# Patient Record
Sex: Male | Born: 1935 | Race: Black or African American | Hispanic: No | Marital: Married | Smoking: Former smoker
Health system: Southern US, Community
[De-identification: ages and names within clinical notes are randomized; demographics above are authoritative.]

## PROBLEM LIST (undated history)

## (undated) DIAGNOSIS — I10 Essential (primary) hypertension: Secondary | ICD-10-CM

## (undated) HISTORY — PX: PROSTATE SURGERY: SHX751

---

## 1998-12-28 ENCOUNTER — Ambulatory Visit (HOSPITAL_COMMUNITY): Admission: RE | Admit: 1998-12-28 | Discharge: 1998-12-28 | Payer: Self-pay | Admitting: Otolaryngology

## 1998-12-28 ENCOUNTER — Encounter: Payer: Self-pay | Admitting: Otolaryngology

## 1999-02-07 ENCOUNTER — Ambulatory Visit (HOSPITAL_COMMUNITY): Admission: RE | Admit: 1999-02-07 | Discharge: 1999-02-07 | Payer: Self-pay | Admitting: Otolaryngology

## 1999-02-07 ENCOUNTER — Encounter: Payer: Self-pay | Admitting: Otolaryngology

## 1999-05-15 ENCOUNTER — Encounter: Admission: RE | Admit: 1999-05-15 | Discharge: 1999-08-13 | Payer: Self-pay | Admitting: *Deleted

## 1999-05-21 ENCOUNTER — Ambulatory Visit (HOSPITAL_COMMUNITY): Admission: RE | Admit: 1999-05-21 | Discharge: 1999-05-21 | Payer: Self-pay | Admitting: *Deleted

## 1999-05-22 ENCOUNTER — Encounter: Admission: RE | Admit: 1999-05-22 | Discharge: 1999-05-22 | Payer: Self-pay | Admitting: *Deleted

## 1999-06-07 ENCOUNTER — Ambulatory Visit (HOSPITAL_COMMUNITY): Admission: RE | Admit: 1999-06-07 | Discharge: 1999-06-07 | Payer: Self-pay | Admitting: *Deleted

## 2011-11-26 ENCOUNTER — Other Ambulatory Visit: Payer: Self-pay

## 2011-11-26 ENCOUNTER — Encounter (HOSPITAL_COMMUNITY): Payer: Self-pay | Admitting: *Deleted

## 2011-11-26 ENCOUNTER — Inpatient Hospital Stay (HOSPITAL_COMMUNITY)
Admission: EM | Admit: 2011-11-26 | Discharge: 2011-11-28 | DRG: 309 | Disposition: A | Discharge status: Home / Self Care | Payer: Medicare Other | Attending: Internal Medicine | Admitting: Internal Medicine

## 2011-11-26 ENCOUNTER — Emergency Department (HOSPITAL_COMMUNITY): Payer: Medicare Other

## 2011-11-26 DIAGNOSIS — R002 Palpitations: Principal | ICD-10-CM | POA: Diagnosis present

## 2011-11-26 DIAGNOSIS — G20A1 Parkinson's disease without dyskinesia, without mention of fluctuations: Secondary | ICD-10-CM | POA: Diagnosis present

## 2011-11-26 DIAGNOSIS — G20 Parkinson's disease: Secondary | ICD-10-CM | POA: Diagnosis present

## 2011-11-26 DIAGNOSIS — E7800 Pure hypercholesterolemia, unspecified: Secondary | ICD-10-CM | POA: Diagnosis present

## 2011-11-26 DIAGNOSIS — I10 Essential (primary) hypertension: Secondary | ICD-10-CM

## 2011-11-26 DIAGNOSIS — Z7982 Long term (current) use of aspirin: Secondary | ICD-10-CM

## 2011-11-26 DIAGNOSIS — N179 Acute kidney failure, unspecified: Secondary | ICD-10-CM | POA: Diagnosis present

## 2011-11-26 DIAGNOSIS — R112 Nausea with vomiting, unspecified: Secondary | ICD-10-CM

## 2011-11-26 DIAGNOSIS — Z79899 Other long term (current) drug therapy: Secondary | ICD-10-CM

## 2011-11-26 DIAGNOSIS — D649 Anemia, unspecified: Secondary | ICD-10-CM | POA: Diagnosis present

## 2011-11-26 DIAGNOSIS — Z87891 Personal history of nicotine dependence: Secondary | ICD-10-CM

## 2011-11-26 HISTORY — DX: Essential (primary) hypertension: I10

## 2011-11-26 LAB — CBC
HCT: 33.1 % — ABNORMAL LOW (ref 39.0–52.0)
Hemoglobin: 10.8 g/dL — ABNORMAL LOW (ref 13.0–17.0)
MCH: 28.6 pg (ref 26.0–34.0)
MCHC: 32.6 g/dL (ref 30.0–36.0)
MCV: 87.8 fL (ref 78.0–100.0)
Platelets: 164 10*3/uL (ref 150–400)
RBC: 3.77 MIL/uL — ABNORMAL LOW (ref 4.22–5.81)
RDW: 13.5 % (ref 11.5–15.5)
WBC: 3.8 10*3/uL — ABNORMAL LOW (ref 4.0–10.5)

## 2011-11-26 LAB — COMPREHENSIVE METABOLIC PANEL
AST: 12 U/L (ref 0–37)
BUN: 22 mg/dL (ref 6–23)
CO2: 28 mEq/L (ref 19–32)
Calcium: 9.5 mg/dL (ref 8.4–10.5)
Chloride: 106 mEq/L (ref 96–112)
Creatinine, Ser: 1.62 mg/dL — ABNORMAL HIGH (ref 0.50–1.35)
GFR calc Af Amer: 45 mL/min — ABNORMAL LOW (ref >90)
GFR calc non Af Amer: 38 mL/min — ABNORMAL LOW (ref >90)
Glucose, Bld: 119 mg/dL — ABNORMAL HIGH (ref 70–99)
Total Bilirubin: 0.2 mg/dL — ABNORMAL LOW (ref 0.3–1.2)

## 2011-11-26 LAB — PHOSPHORUS: Phosphorus: 2.8 mg/dL (ref 2.3–4.6)

## 2011-11-26 LAB — RETICULOCYTES
RBC.: 3.62 MIL/uL — ABNORMAL LOW (ref 4.22–5.81)
Retic Count, Absolute: 54.3 10*3/uL (ref 19.0–186.0)
Retic Ct Pct: 1.5 % (ref 0.4–3.1)

## 2011-11-26 LAB — DIFFERENTIAL
Basophils Absolute: 0 10*3/uL (ref 0.0–0.1)
Basophils Relative: 1 % (ref 0–1)
Eosinophils Absolute: 0 10*3/uL (ref 0.0–0.7)
Eosinophils Relative: 1 % (ref 0–5)
Lymphocytes Relative: 26 % (ref 12–46)
Lymphs Abs: 1 10*3/uL (ref 0.7–4.0)
Monocytes Absolute: 0.3 10*3/uL (ref 0.1–1.0)
Monocytes Relative: 9 % (ref 3–12)
Neutro Abs: 2.4 10*3/uL (ref 1.7–7.7)
Neutrophils Relative %: 64 % (ref 43–77)

## 2011-11-26 LAB — POCT I-STAT TROPONIN I: Troponin i, poc: 0 ng/mL (ref 0.00–0.08)

## 2011-11-26 LAB — MAGNESIUM: Magnesium: 2 mg/dL (ref 1.5–2.5)

## 2011-11-26 MED ORDER — ATORVASTATIN CALCIUM 40 MG PO TABS
40.0000 mg | ORAL_TABLET | Freq: Every day | ORAL | Status: DC
  Administered 2011-11-27: 40 mg via ORAL
  Filled 2011-11-26 (×2): qty 1

## 2011-11-26 MED ORDER — ONDANSETRON HCL 4 MG PO TABS: 4.0000 mg | ORAL_TABLET | Freq: Four times a day (QID) | ORAL | Status: DC | PRN

## 2011-11-26 MED ORDER — ACETAMINOPHEN 650 MG RE SUPP: 650.0000 mg | Freq: Four times a day (QID) | RECTAL | Status: DC | PRN

## 2011-11-26 MED ORDER — PANTOPRAZOLE SODIUM 40 MG PO TBEC
40.0000 mg | DELAYED_RELEASE_TABLET | Freq: Every day | ORAL | Status: DC
  Administered 2011-11-27 – 2011-11-28 (×2): 40 mg via ORAL
  Filled 2011-11-26 (×2): qty 1

## 2011-11-26 MED ORDER — ASPIRIN EC 81 MG PO TBEC
81.0000 mg | DELAYED_RELEASE_TABLET | Freq: Every day | ORAL | Status: DC
  Administered 2011-11-27 – 2011-11-28 (×2): 81 mg via ORAL
  Filled 2011-11-26 (×2): qty 1

## 2011-11-26 MED ORDER — METOPROLOL TARTRATE 25 MG PO TABS
25.0000 mg | ORAL_TABLET | Freq: Two times a day (BID) | ORAL | Status: DC
  Administered 2011-11-26 – 2011-11-28 (×4): 25 mg via ORAL
  Filled 2011-11-26 (×5): qty 1

## 2011-11-26 MED ORDER — HYPROMELLOSE (GONIOSCOPIC) 2.5 % OP SOLN: 1.0000 [drp] | Freq: Three times a day (TID) | OPHTHALMIC | Status: DC | PRN

## 2011-11-26 MED ORDER — ENOXAPARIN SODIUM 30 MG/0.3ML ~~LOC~~ SOLN: 30.0000 mg | SUBCUTANEOUS | Status: DC

## 2011-11-26 MED ORDER — ZOLPIDEM TARTRATE 5 MG PO TABS: 5.0000 mg | ORAL_TABLET | Freq: Every evening | ORAL | Status: DC | PRN

## 2011-11-26 MED ORDER — RASAGILINE MESYLATE 0.5 MG PO TABS
1.0000 mg | ORAL_TABLET | Freq: Every day | ORAL | Status: DC
  Filled 2011-11-26: qty 2

## 2011-11-26 MED ORDER — RASAGILINE MESYLATE 0.5 MG PO TABS
1.0000 mg | ORAL_TABLET | Freq: Every day | ORAL | Status: DC
  Administered 2011-11-26: 1 mg via ORAL
  Filled 2011-11-26 (×3): qty 2

## 2011-11-26 MED ORDER — SODIUM CHLORIDE 0.9 % IV SOLN
INTRAVENOUS | Status: AC
  Administered 2011-11-26 – 2011-11-27 (×2): via INTRAVENOUS

## 2011-11-26 MED ORDER — MORPHINE SULFATE 2 MG/ML IJ SOLN: 1.0000 mg | INTRAMUSCULAR | Status: DC | PRN

## 2011-11-26 MED ORDER — POLYVINYL ALCOHOL 1.4 % OP SOLN
1.0000 [drp] | OPHTHALMIC | Status: DC | PRN
  Filled 2011-11-26: qty 15

## 2011-11-26 MED ORDER — ACETAMINOPHEN 325 MG PO TABS: 650.0000 mg | ORAL_TABLET | Freq: Four times a day (QID) | ORAL | Status: DC | PRN

## 2011-11-26 MED ORDER — ONDANSETRON HCL 4 MG/2ML IJ SOLN: 4.0000 mg | Freq: Four times a day (QID) | INTRAMUSCULAR | Status: DC | PRN

## 2011-11-26 MED ORDER — SODIUM CHLORIDE 0.9 % IJ SOLN
3.0000 mL | Freq: Two times a day (BID) | INTRAMUSCULAR | Status: DC
  Administered 2011-11-27: 3 mL via INTRAVENOUS

## 2011-11-26 MED ORDER — DOCUSATE SODIUM 100 MG PO CAPS
100.0000 mg | ORAL_CAPSULE | Freq: Two times a day (BID) | ORAL | Status: DC
  Administered 2011-11-26 – 2011-11-28 (×4): 100 mg via ORAL
  Filled 2011-11-26 (×5): qty 1

## 2011-11-26 MED ORDER — CARBIDOPA-LEVODOPA 25-100 MG PO TABS
2.0000 | ORAL_TABLET | Freq: Three times a day (TID) | ORAL | Status: DC
  Administered 2011-11-26 – 2011-11-28 (×5): 2 via ORAL
  Filled 2011-11-26 (×7): qty 2

## 2011-11-26 NOTE — ED Notes (Signed)
Attempted to give report. Receiving RN reports she can't take report at moment d/t shift change.

## 2011-11-26 NOTE — ED Notes (Signed)
PT had heart fluttering that started Sunday.  Fatigue and no chest pain

## 2011-11-26 NOTE — H&P (Signed)
[facility]   [facility] [place]   Chief Complaint:  Heart fluttering since Sunday.  HPI: Kevin Roman is a plesant 78 year old male who has Parkinson's diesease, htn, hyperlipidemia, who followed at the [facility] in [place], who comes in with fluttering of the heart, which he first noticed while at church on Sunday. This lasted a few seconds, but after that he became weak and clammy. He had a similar episode this morning, hence coming to the ED. This is new for him. His wife mentions that he had cold like symptoms about 2 weeks ago, but this improved on its own. Patient denies any diarrhea or vomiting or fever or chest pain or dizziness, PND or orthopnea. No SOBE. No change in his meds recently.  Review of Systems:  The patient denies anorexia, fever, weight loss,, vision loss, decreased hearing, hoarseness, chest pain, syncope, dyspnea on exertion, peripheral edema, balance deficits, hemoptysis, abdominal pain, melena, hematochezia, severe indigestion/heartburn, hematuria, incontinence, genital sores, muscle weakness, suspicious skin lesions, transient blindness, difficulty walking, depression, unusual weight change, abnormal bleeding, enlarged lymph nodes, angioedema, and breast masses.  Past Medical History: Past Medical History  Diagnosis Date  . Hypertension   . Parkinson's disease    Past Surgical History  Procedure Date  . Prostate surgery     Medications: Prior to Admission medications   Medication Sig Start Date End Date Taking? Authorizing Provider  aspirin EC 81 MG tablet Take 81 mg by mouth daily.   Yes Historical Provider, MD  carbidopa-levodopa (SINEMET IR) 25-100 MG per tablet Take 2 tablets by mouth 3 (three) times daily.   Yes Historical Provider, MD  cloNIDine (CATAPRES) 0.1 MG tablet Take 0.1 mg by mouth daily as needed. For blood pressure   Yes Historical Provider, MD  hydroxypropyl methylcellulose (ISOPTO TEARS) 2.5 % ophthalmic solution Place 1 drop into both eyes 3 (three) times daily  as needed. For dry eyes   Yes Historical Provider, MD  lisinopril-hydrochlorothiazide (PRINZIDE,ZESTORETIC) 20-12.5 MG per tablet Take 1 tablet by mouth daily.   Yes Historical Provider, MD  metoprolol (LOPRESSOR) 50 MG tablet Take 25 mg by mouth 2 (two) times daily.   Yes Historical Provider, MD  rasagiline (AZILECT) 0.5 MG TABS Take 1 mg by mouth daily.   Yes Historical Provider, MD  simvastatin (ZOCOR) 80 MG tablet Take 40 mg by mouth at bedtime.   Yes Historical Provider, MD    Allergies:  No Known Allergies  Social History:  reports that he has quit smoking. He does not have any smokeless tobacco history on file. He reports that he does not drink alcohol or use illicit drugs. lives with his wife at home.  Family History: No family history on file.  Physical Exam: Filed Vitals:   11/26/11 1247 11/26/11 1511 11/26/11 1514 11/26/11 1632  BP: 137/91 145/97  152/78  Pulse: 72 62  58  Temp: 98 F (36.7 C) 97.8 F (36.6 C)    TempSrc: Oral Oral    Resp: 20 13  17   SpO2: 100% 100% 98% 99%   Parkinsonian tremors, supportive family members at bed side. PERRLA. No JVD. No carotid bruits. Lungs clear. S1S2 heard, No murmurs. RRR. Abdomen soft, non tender. +BS. CNS- Parkinson tremors, otherwise grossly Intact. Extremities- tinge of pedal edema bilaterally. Good peripheral pulses.   Labs on Admission:   [facility] 11/26/11 1321  NA 144  K 3.8  CL 106  CO2 28  GLUCOSE 119*  BUN 22  CREATININE 1.62*  CALCIUM 9.5  MG --  PHOS --    Basename 11/26/11 1321  AST 12  ALT 6  ALKPHOS 71  BILITOT 0.2*  PROT 7.7  ALBUMIN 4.0   No results found for this basename: LIPASE:2,AMYLASE:2 in the last 72 hours  Basename 11/26/11 1321  WBC 3.8*  NEUTROABS 2.4  HGB 10.8*  HCT 33.1*  MCV 87.8  PLT 164   No results found for this basename: CKTOTAL:3,CKMB:3,CKMBINDEX:3,TROPONINI:3 in the last 72 hours No results found for this basename: TSH,T4TOTAL,FREET3,T3FREE,THYROIDAB in the last 72  hours No results found for this basename: VITAMINB12:2,FOLATE:2,FERRITIN:2,TIBC:2,IRON:2,RETICCTPCT:2 in the last 72 hours  Radiological Exams on Admission: Dg Chest 2 View  11/26/2011  *RADIOLOGY REPORT*  Clinical Data: Atrial fibrillation.  CHEST - 2 VIEW  Comparison: None.  Findings: Mild cardiomegaly and hyperinflation of the lungs.  No confluent opacity or effusion.  No acute bony abnormality.  IMPRESSION: Mild cardiomegaly, hyperinflation.  No active disease.  Original Report Authenticated By: Cyndie Chime, M.D.    Assessment  Pleasant 78 year old male, who comes in with palpitations. I reviewed his EKG which shows some nonspecific st/t wave changes. He is slightly anemic and appears to have some renal insufficiency. He may be having arrhythmias. If this has anything to do with the apparent URTI 2 weeks ago, would worry about viral pericarditis.  Plan  .Palpitations- admit telemetry. Serial cardiac enzymes/tsh/2decho/lipids panel/d.dimer, consider cardiology eval depending on investigations. Gently rehydrate. Marland KitchenHTN (hypertension)- will hold acei/hctz/clonidine, and continue lopressor for now, and monitor. .Parkinson's disease- seems generally controlled. Resume home meds. .Hypercholesteremia- place on lipitor in house. .Anemia- normocytic. Anemia panel, stool hemoccult. Dvt/gi prophylaxis.  Condition guarded.   Kevin Roman [phone]. 11/26/2011, 6:49 PM

## 2011-11-26 NOTE — ED Notes (Signed)
Pt undressed and placed in gown and placed on monitor and continuous pulse ox, blood pressure and EKG.

## 2011-11-26 NOTE — ED Provider Notes (Signed)
I saw and evaluated the patient, reviewed the resident's note and I agree with the findings and plan. I saw and evaluated the patient's EKG and I agree with the resident's interpretation. Patient with 2 separate episodes of near syncopal event with palpitations which caused him to be weak and diaphoretic. He denies any chest pain and states he feels fine now. He has no cardiologist and no prior history EKG today T waves but otherwise normal labs. Will limit for observation for further evaluation of these episodes  Gwyneth Sprout, MD 11/26/11 1654

## 2011-11-26 NOTE — ED Provider Notes (Signed)
History     CSN: [id number]  Arrival date & time 11/26/11  1246   First MD Initiated Contact with Patient 11/26/11 1502      Chief Complaint  Patient presents with  . Weakness    pt reports episode of feeling as if heart was "fluttering" while singing in choir x3 days ago; also additional episode of "fluttering" shortly thereafter - reports feeling fatigued after each episode; no additional symptoms per pt     (Consider location/radiation/quality/duration/timing/severity/associated sxs/prior treatment) HPI Comments: Pt was sitting earlier today and had several seconds of palpitations.  Said he felt weak and lightheaded during episode but did not have LOC.  No chest pain or dyspnea.  Resolved quickly without intervention.  Now feels well.  Patient is a 78 y.o. male presenting with palpitations. The history is provided by the patient.  Palpitations  This is a new problem. The current episode started less than 1 hour ago. The problem occurs rarely. The problem has been resolved. Associated with: sitting at rest. Episode Length: "several seconds" Associated symptoms include irregular heartbeat. Pertinent negatives include no diaphoresis, no fever, no numbness, no chest pain, no chest pressure, no nausea, no vomiting, no headaches and no shortness of breath. He has tried nothing for the symptoms.    Past Medical History  Diagnosis Date  . Hypertension   . Parkinson's disease     Past Surgical History  Procedure Date  . Prostate surgery     No family history on file.  History  Substance Use Topics  . Smoking status: Former Games developer  . Smokeless tobacco: Not on file  . Alcohol Use: No      Review of Systems  Constitutional: Negative for fever, diaphoresis, activity change and fatigue.  HENT: Negative for congestion.   Eyes: Negative for pain.  Respiratory: Negative for chest tightness, shortness of breath, wheezing and stridor.   Cardiovascular: Positive for palpitations.  Negative for chest pain and leg swelling.  Gastrointestinal: Negative for nausea and vomiting.  Genitourinary: Negative for dysuria.  Musculoskeletal: Negative for arthralgias.  Skin: Negative for rash.  Neurological: Negative for numbness and headaches.  Psychiatric/Behavioral: Negative for behavioral problems.    Allergies  Review of patient's allergies indicates no known allergies.  Home Medications   Current Outpatient Rx  Name Route Sig Dispense Refill  . ASPIRIN EC 81 MG PO TBEC Oral Take 81 mg by mouth daily.    Marland Kitchen CARBIDOPA-LEVODOPA 25-100 MG PO TABS Oral Take 2 tablets by mouth 3 (three) times daily.    Marland Kitchen CLONIDINE HCL 0.1 MG PO TABS Oral Take 0.1 mg by mouth daily as needed. For blood pressure    . HYPROMELLOSE 2.5 % OP SOLN Both Eyes Place 1 drop into both eyes 3 (three) times daily as needed. For dry eyes    . LISINOPRIL-HYDROCHLOROTHIAZIDE 20-12.5 MG PO TABS Oral Take 1 tablet by mouth daily.    Marland Kitchen METOPROLOL TARTRATE 50 MG PO TABS Oral Take 25 mg by mouth 2 (two) times daily.    Marland Kitchen RASAGILINE MESYLATE 0.5 MG PO TABS Oral Take 1 mg by mouth daily.    Marland Kitchen SIMVASTATIN 80 MG PO TABS Oral Take 40 mg by mouth at bedtime.      BP 152/78  Pulse 58  Temp(Src) 97.8 F (36.6 C) (Oral)  Resp 17  SpO2 99%  Physical Exam  Constitutional: He is oriented to person, place, and time. He appears well-developed and well-nourished. No distress.  HENT:  Head: Normocephalic and  atraumatic.  Eyes: Conjunctivae and EOM are normal. Pupils are equal, round, and reactive to light. No scleral icterus.  Neck: Normal range of motion. Neck supple.  Cardiovascular: Normal rate and regular rhythm.  Exam reveals no gallop and no friction rub.   No murmur heard. Pulmonary/Chest: Effort normal and breath sounds normal. No respiratory distress. He has no wheezes. He has no rales. He exhibits no tenderness.  Abdominal: Soft. He exhibits no distension and no mass. There is no tenderness. There is no  rebound and no guarding.  Musculoskeletal: Normal range of motion. He exhibits no edema and no tenderness.  Neurological: He is alert and oriented to person, place, and time. He has normal reflexes. No cranial nerve deficit. He exhibits normal muscle tone. Coordination normal.       Tremulous (baseline)  Skin: Skin is warm and dry. No rash noted. He is not diaphoretic. No erythema.  Psychiatric: He has a normal mood and affect. His behavior is normal. Judgment and thought content normal.    ED Course  Procedures (including critical care time)   Date: 11/26/2011  Rate: 65  Rhythm: normal sinus rhythm  QRS Axis: normal  Intervals: normal  ST/T Wave abnormalities: nonspecific T wave changes  Conduction Disutrbances:none  Narrative Interpretation:   Old EKG Reviewed: none available    Labs Reviewed  CBC - Abnormal; Notable for the following:    WBC 3.8 (*)    RBC 3.77 (*)    Hemoglobin 10.8 (*)    HCT 33.1 (*)    All other components within normal limits  COMPREHENSIVE METABOLIC PANEL - Abnormal; Notable for the following:    Glucose, Bld 119 (*)    Creatinine, Ser 1.62 (*)    Total Bilirubin 0.2 (*)    GFR calc non Af Amer 38 (*)    GFR calc Af Amer 45 (*)    All other components within normal limits  DIFFERENTIAL  POCT I-STAT TROPONIN I   Dg Chest 2 View  11/26/2011  *RADIOLOGY REPORT*  Clinical Data: Atrial fibrillation.  CHEST - 2 VIEW  Comparison: None.  Findings: Mild cardiomegaly and hyperinflation of the lungs.  No confluent opacity or effusion.  No acute bony abnormality.  IMPRESSION: Mild cardiomegaly, hyperinflation.  No active disease.  Original Report Authenticated By: Cyndie Chime, M.D.     1. Palpitations       MDM  Pt was sitting earlier today and had several seconds of palpitations.  Said he felt weak and lightheaded during episode but did not have LOC.  No chest pain or dyspnea.  Resolved quickly without intervention.  Now feels well.  Similar  episode several days ago.  Concern for arrythmia.  Labs, EKG, CXR unconcerning in ED.  Hospitalist to admit for tele monitoring.        Army Chaco, MD 11/26/11 [phone]

## 2011-11-26 NOTE — ED Notes (Signed)
Pt is in nsr on ekg now.  NO fluttering and no history of this in the past

## 2011-11-27 DIAGNOSIS — G20 Parkinson's disease: Secondary | ICD-10-CM

## 2011-11-27 DIAGNOSIS — R112 Nausea with vomiting, unspecified: Secondary | ICD-10-CM

## 2011-11-27 DIAGNOSIS — I10 Essential (primary) hypertension: Secondary | ICD-10-CM

## 2011-11-27 DIAGNOSIS — I517 Cardiomegaly: Secondary | ICD-10-CM

## 2011-11-27 DIAGNOSIS — R002 Palpitations: Secondary | ICD-10-CM

## 2011-11-27 LAB — CARDIAC PANEL(CRET KIN+CKTOT+MB+TROPI)
CK, MB: 1.7 ng/mL (ref 0.3–4.0)
Relative Index: 1.6 (ref 0.0–2.5)
Relative Index: 1.5 (ref 0.0–2.5)
Total CK: 108 U/L (ref 7–232)
Troponin I: <0.3 ng/mL (ref <0.30)
Troponin I: <0.3 ng/mL (ref <0.30)

## 2011-11-27 LAB — CBC
Hemoglobin: 10.4 g/dL — ABNORMAL LOW (ref 13.0–17.0)
MCHC: 33 g/dL (ref 30.0–36.0)
RDW: 13.4 % (ref 11.5–15.5)
WBC: 4.7 10*3/uL (ref 4.0–10.5)

## 2011-11-27 LAB — COMPREHENSIVE METABOLIC PANEL
ALT: <5 U/L (ref 0–53)
BUN: 24 mg/dL — ABNORMAL HIGH (ref 6–23)
CO2: 29 mEq/L (ref 19–32)
Calcium: 9.3 mg/dL (ref 8.4–10.5)
Creatinine, Ser: 1.53 mg/dL — ABNORMAL HIGH (ref 0.50–1.35)
GFR calc Af Amer: 48 mL/min — ABNORMAL LOW (ref >90)
GFR calc non Af Amer: 41 mL/min — ABNORMAL LOW (ref >90)
Glucose, Bld: 89 mg/dL (ref 70–99)
Sodium: 144 mEq/L (ref 135–145)

## 2011-11-27 LAB — LIPID PANEL
Cholesterol: 129 mg/dL (ref 0–200)
HDL: 44 mg/dL (ref >39)
Triglycerides: 110 mg/dL (ref <150)

## 2011-11-27 LAB — VITAMIN B12: Vitamin B-12: 529 pg/mL (ref 211–911)

## 2011-11-27 LAB — FOLATE: Folate: 13.4 ng/mL

## 2011-11-27 NOTE — Progress Notes (Signed)
DAILY PROGRESS NOTE                              [facility]  SUBJECTIVE: Denies any shortness of breath or chest pain. He mentioned some palpitation this morning about 8:30 AM.  OBJECTIVE: BP 142/84  Pulse 68  Temp(Src) 98.2 F (36.8 C) (Oral)  Resp 20  Ht 5\' 8"  (1.727 m)  Wt 79.788 kg (175 lb 14.4 oz)  BMI 26.75 kg/m2  SpO2 99%  Intake/Output Summary (Last 24 hours) at 11/27/11 1109 Last data filed at 11/27/11 0044  Gross per 24 hour  Intake      0 ml  Output    600 ml  Net   -600 ml                      Weight change:  Physical Exam: General: Alert and awake oriented x3 not in any acute distress. HEENT: anicteric sclera, pupils equal reactive to light and accommodation CVS: S1-S2 heard, no murmur rubs or gallops Chest: clear to auscultation bilaterally, no wheezing rales or rhonchi Abdomen:  normal bowel sounds, soft, nontender, nondistended, no organomegaly Neuro: Cranial nerves II-XII intact, no focal neurological deficits Extremities: no cyanosis, no clubbing or edema noted bilaterally   Lab Results:  Basename 11/27/11 0356 11/26/11 2038 11/26/11 1321  NA 144 -- 144  K 4.0 -- 3.8  CL 108 -- 106  CO2 29 -- 28  GLUCOSE 89 -- 119*  BUN 24* -- 22  CREATININE 1.53* -- 1.62*  CALCIUM 9.3 -- 9.5  MG -- 2.0 --  PHOS -- 2.8 --    Basename 11/27/11 0356 11/26/11 1321  AST 11 12  ALT <5 6  ALKPHOS 68 71  BILITOT 0.3 0.2*  PROT 7.3 7.7  ALBUMIN 3.7 4.0   No results found for this basename: LIPASE:2,AMYLASE:2 in the last 72 hours  Basename 11/27/11 0356 11/26/11 1321  WBC 4.7 3.8*  NEUTROABS -- 2.4  HGB 10.4* 10.8*  HCT 31.5* 33.1*  MCV 87.3 87.8  PLT 160 164    Basename 11/27/11 0356 11/26/11 2020  CKTOTAL 108 105  CKMB 1.7 1.8  CKMBINDEX -- --  TROPONINI <0.30 <0.30   No components found with this basename: POCBNP:3  Basename 11/26/11 1838  DDIMER 0.40   No results found for this basename: HGBA1C:2 in the last  72 hours  Basename 11/27/11 0356  CHOL 129  HDL 44  LDLCALC 63  TRIG 110  CHOLHDL 2.9  LDLDIRECT --    Basename 11/26/11 1854  TSH 0.770  T4TOTAL --  T3FREE --  THYROIDAB --    Basename 11/26/11 2038  VITAMINB12 529  FOLATE 13.4  FERRITIN 208  TIBC 242  IRON 45  RETICCTPCT 1.5    Micro Results: No results found for this or any previous visit (from the past 240 hour(s)).  Studies/Results: Dg Chest 2 View  11/26/2011  *RADIOLOGY REPORT*  Clinical Data: Atrial fibrillation.  CHEST - 2 VIEW  Comparison: None.  Findings: Mild cardiomegaly and hyperinflation of the lungs.  No confluent opacity or effusion.  No acute bony abnormality.  IMPRESSION: Mild cardiomegaly, hyperinflation.  No active disease.  Original Report Authenticated By: Cyndie Chime, M.D.   Medications: Scheduled Meds:   . aspirin EC  81 mg Oral Daily  . atorvastatin  40 mg Oral q1800  . carbidopa-levodopa  2 tablet Oral TID  .  docusate sodium  100 mg Oral BID  . metoprolol  25 mg Oral BID  . pantoprazole  40 mg Oral Q1200  . rasagiline  1 mg Oral Daily  . sodium chloride  3 mL Intravenous Q12H  . DISCONTD: enoxaparin  30 mg Subcutaneous Q24H  . DISCONTD: rasagiline  1 mg Oral Daily   Continuous Infusions:   . sodium chloride 75 mL/hr at 11/26/11 2217   PRN Meds:.acetaminophen, acetaminophen, morphine injection, ondansetron (ZOFRAN) IV, ondansetron, polyvinyl alcohol, zolpidem, DISCONTD: hydroxypropyl methylcellulose  ASSESSMENT & PLAN: Active Problems:  Palpitations  HTN (hypertension)  Parkinson's disease  Hypercholesteremia  Anemia  Palpitations -Patient is on telemetry, was showing sinus rhythm was frequent PVCs and sometimes bigeminy. -No evidence of acute coronary syndrome by 3 sets of cardiac enzymes were negative. -Patient has severe. He is on metoprolol 25 mg. -Since palpitations is the admission diagnosis, I will ask cardiology to evaluate the patient.  Hypertension -Blood  pressure reasonably controlled, continue preadmission medications.  Parkinson's disease -This is chronic stable condition, continue preadmission medications.  Elevated creatinine -Not sure of this CKD or acute renal failure. Continue hydration for now. -Fall on renal function in the morning his BUN is elevated too.  Anemia -No baseline of hemoglobin. -Hemoglobin seems stable so far in the hospital, we'll check in the morning.  -Anemia panel showed normal iron studies, folate and B12.   LOS: 1 day   Lonald Troiani A 11/27/2011, 11:09 AM

## 2011-11-27 NOTE — Consult Note (Signed)
Admit date: 11/26/2011 Referring Physician  Dr. Arthor Captain Primary Physician  [facility] Primary Cardiologist  new-Reginaldo Hazard Reason for Consultation  palpitations  HPI: 78 year old male with Parkinson's disease and renal insufficiency who has had palpitations over the last couple of days.  He was singing in the choir at church where he felt a fluttering and a dizzy spell for about 2 seconds.  These symptoms repeated themselves on Monday so he came in for further evaluation.  He is rule out for MI.  He has been watched on telemetry.  There is been report of PVCs noted.  There is no evidence of sustained arrhythmias.  The patient does not report symptoms of any sustained arrhythmias.  His wife is concerned that he takes too much medication.  He denies any chest pain or shortness of breath.  His activity is limited by his Parkinson's disease.     PMH:   Past Medical History  Diagnosis Date  . Hypertension   . Parkinson's disease      PSH:   Past Surgical History  Procedure Date  . Prostate surgery     Allergies:  Review of patient's allergies indicates no known allergies. Prior to Admit Meds:   Prescriptions prior to admission  Medication Sig Dispense Refill  . aspirin EC 81 MG tablet Take 81 mg by mouth daily.      . carbidopa-levodopa (SINEMET IR) 25-100 MG per tablet Take 2 tablets by mouth 3 (three) times daily.      . cloNIDine (CATAPRES) 0.1 MG tablet Take 0.1 mg by mouth daily as needed. For blood pressure      . hydroxypropyl methylcellulose (ISOPTO TEARS) 2.5 % ophthalmic solution Place 1 drop into both eyes 3 (three) times daily as needed. For dry eyes      . lisinopril-hydrochlorothiazide (PRINZIDE,ZESTORETIC) 20-12.5 MG per tablet Take 1 tablet by mouth daily.      . metoprolol (LOPRESSOR) 50 MG tablet Take 25 mg by mouth 2 (two) times daily.      . rasagiline (AZILECT) 0.5 MG TABS Take 1 mg by mouth daily.      . simvastatin (ZOCOR) 80 MG tablet Take 40 mg by mouth at  bedtime.       Fam HX:   No family history on file. Social HX:    History   Social History  . Marital Status: Married    Spouse Name: N/A    Number of Children: N/A  . Years of Education: N/A   Occupational History  . Not on file.   Social History Main Topics  . Smoking status: Former Games developer  . Smokeless tobacco: Not on file  . Alcohol Use: No  . Drug Use: No  . Sexually Active:    Other Topics Concern  . Not on file   Social History Narrative  . No narrative on file     ROS:  All 11 ROS were addressed and are negative except what is stated in the HPI  Physical Exam: Blood pressure 142/82, pulse 67, temperature 98.3 F (36.8 C), temperature source Oral, resp. rate 18, height 5\' 8"  (1.727 m), weight 79.788 kg (175 lb 14.4 oz), SpO2 98.00%.  General: Well developed, well nourished, in no acute distress Head: Eyes PERRLA,   Normal cephalic and atramatic  Lungs:   Clear bilaterally to auscultation and percussion. Heart:   HRRR S1 S2            Abdomen:  abdomen soft and non-tender Msk:  . Normal  strength and tone for age. Extremities:   No  edema.   Neuro: Alert and oriented  Psych:  Normal affect, responds appropriately    Labs:   Lab Results  Component Value Date   WBC 4.7 11/27/2011   HGB 10.4* 11/27/2011   HCT 31.5* 11/27/2011   MCV 87.3 11/27/2011   PLT 160 11/27/2011    Lab 11/27/11 0356  NA 144  K 4.0  CL 108  CO2 29  BUN 24*  CREATININE 1.53*  CALCIUM 9.3  PROT 7.3  BILITOT 0.3  ALKPHOS 68  ALT <5  AST 11  GLUCOSE 89   No results found for this basename: PTT   Lab Results  Component Value Date   INR 1.10 11/27/2011   Lab Results  Component Value Date   CKTOTAL 120 11/27/2011   CKMB 1.8 11/27/2011   TROPONINI <0.30 11/27/2011     Lab Results  Component Value Date   CHOL 129 11/27/2011   Lab Results  Component Value Date   HDL 44 11/27/2011   Lab Results  Component Value Date   LDLCALC 63 11/27/2011   Lab Results  Component Value  Date   TRIG 110 11/27/2011   Lab Results  Component Value Date   CHOLHDL 2.9 11/27/2011   No results found for this basename: LDLDIRECT      Radiology:  Dg Chest 2 View  11/26/2011  *RADIOLOGY REPORT*  Clinical Data: Atrial fibrillation.  CHEST - 2 VIEW  Comparison: None.  Findings: Mild cardiomegaly and hyperinflation of the lungs.  No confluent opacity or effusion.  No acute bony abnormality.  IMPRESSION: Mild cardiomegaly, hyperinflation.  No active disease.  Original Report Authenticated By: Cyndie Chime, M.D.    EKG:  Normal sinus rhythm, nonspecific ST-T wave changes  ASSESSMENT: Palpitations; short lived, lasting a few seconds.  No symptoms longer than that.  PLAN:  No syncope.  This sounds to be likely, something benign.  Given the fact that they're very short lived, I would suspect PVCs or PACs.  He apparently had a PVC this morning which correlated to symptoms.  His echocardiogram was reviewed and he had normal LV function and essentially normal valvular function.  There are no symptoms of ischemia.  Could place a 30 day event monitor on the patient if he has no high risk arrhythmia while in the hospital.  If itt is confirmed that his symptoms correlate to PVCs, then no further workup would be warranted.  Corky Crafts., MD  11/27/2011  5:09 PM

## 2011-11-27 NOTE — Progress Notes (Signed)
  Echocardiogram 2D Echocardiogram has been performed.  Alphons Burgert L 11/27/2011, 11:33 AM

## 2011-11-28 DIAGNOSIS — I10 Essential (primary) hypertension: Secondary | ICD-10-CM

## 2011-11-28 DIAGNOSIS — N179 Acute kidney failure, unspecified: Secondary | ICD-10-CM | POA: Diagnosis present

## 2011-11-28 DIAGNOSIS — G20 Parkinson's disease: Secondary | ICD-10-CM

## 2011-11-28 DIAGNOSIS — R112 Nausea with vomiting, unspecified: Secondary | ICD-10-CM

## 2011-11-28 DIAGNOSIS — R002 Palpitations: Secondary | ICD-10-CM

## 2011-11-28 LAB — BASIC METABOLIC PANEL
Chloride: 105 mEq/L (ref 96–112)
Creatinine, Ser: 1.31 mg/dL (ref 0.50–1.35)
GFR calc Af Amer: 58 mL/min — ABNORMAL LOW (ref >90)
Potassium: 4.2 mEq/L (ref 3.5–5.1)
Sodium: 141 mEq/L (ref 135–145)

## 2011-11-28 NOTE — Progress Notes (Signed)
Utilization Review Completed.Kevin Roman T5/30/2013   

## 2011-11-28 NOTE — Progress Notes (Signed)
SUBJECTIVE:  No palpitations  OBJECTIVE:   Vitals:   Filed Vitals:   11/27/11 1351 11/27/11 2202 11/28/11 0602 11/28/11 1028  BP: 142/82 164/80 143/85 161/84  Pulse: 67 66 67 73  Temp: 98.3 F (36.8 C) 98.9 F (37.2 C) 98.4 F (36.9 C)   TempSrc: Oral Oral Oral   Resp: 18 18 18    Height:      Weight:   81.375 kg (179 lb 6.4 oz)   SpO2: 98% 98% 95%    I&O's:   Intake/Output Summary (Last 24 hours) at 11/28/11 1151 Last data filed at 11/28/11 0603  Gross per 24 hour  Intake      3 ml  Output    750 ml  Net   -747 ml   TELEMETRY: Reviewed telemetry pt in NSR     PHYSICAL EXAM General: Well developed, well nourished, in no acute distress Head:  Normal cephalic and atramatic  Lungs:   Clear bilaterally to auscultation and percussion. Heart:   HRRR S1 S2  Abdomen:  abdomen soft and non-tender Msk:  Back normal, normal gait. Normal strength and tone for age. Extremities:   Noedema.   Neuro: Alert and oriented X 3. Psych: Normal affect, responds appropriately   LABS: Basic Metabolic Panel:  Basename 11/28/11 0520 11/27/11 0356 11/26/11 2038  NA 141 144 --  K 4.2 4.0 --  CL 105 108 --  CO2 26 29 --  GLUCOSE 90 89 --  BUN 20 24* --  CREATININE 1.31 1.53* --  CALCIUM 8.8 9.3 --  MG -- -- 2.0  PHOS -- -- 2.8   Liver Function Tests:  Basename 11/27/11 0356 11/26/11 1321  AST 11 12  ALT <5 6  ALKPHOS 68 71  BILITOT 0.3 0.2*  PROT 7.3 7.7  ALBUMIN 3.7 4.0   No results found for this basename: LIPASE:2,AMYLASE:2 in the last 72 hours CBC:  Basename 11/27/11 0356 11/26/11 1321  WBC 4.7 3.8*  NEUTROABS -- 2.4  HGB 10.4* 10.8*  HCT 31.5* 33.1*  MCV 87.3 87.8  PLT 160 164   Cardiac Enzymes:  Basename 11/27/11 1159 11/27/11 0356 11/26/11 2020  CKTOTAL 120 108 105  CKMB 1.8 1.7 1.8  CKMBINDEX -- -- --  TROPONINI <0.30 <0.30 <0.30   BNP: No components found with this basename: POCBNP:3 D-Dimer:  [facility] 11/26/11 1838  DDIMER 0.40   Hemoglobin  A1C: No results found for this basename: HGBA1C in the last 72 hours Fasting Lipid Panel:  Basename 11/27/11 0356  CHOL 129  HDL 44  LDLCALC 63  TRIG 110  CHOLHDL 2.9  LDLDIRECT --   Thyroid Function Tests:  Basename 11/26/11 1854  TSH 0.770  T4TOTAL --  T3FREE --  THYROIDAB --   Anemia Panel:  Basename 11/26/11 2038  VITAMINB12 529  FOLATE 13.4  FERRITIN 208  TIBC 242  IRON 45  RETICCTPCT 1.5   Coag Panel:   Lab Results  Component Value Date   INR 1.10 11/27/2011    RADIOLOGY: Dg Chest 2 View  11/26/2011  *RADIOLOGY REPORT*  Clinical Data: Atrial fibrillation.  CHEST - 2 VIEW  Comparison: None.  Findings: Mild cardiomegaly and hyperinflation of the lungs.  No confluent opacity or effusion.  No acute bony abnormality.  IMPRESSION: Mild cardiomegaly, hyperinflation.  No active disease.  Original Report Authenticated By: Cyndie Chime, M.D.      ASSESSMENT: Palpitations  PLAN:  No significant arrhythmia by tele.  May have been related to PVCs.  Decrease  caffeine intake. Echo showed normal LV and valvular function.  If sx return, he can contact us and we can set up an outpatient event monitor.  Corky Crafts., MD  11/28/2011  11:51 AM

## 2011-11-28 NOTE — Discharge Summary (Signed)
HOSPITAL DISCHARGE SUMMARY  Kevin Roman  MRN: [id number]  DOB:May 07, 1930  Date of Admission: 11/26/2011 Date of Discharge: 11/28/2011         LOS: 2 days   Attending Physician:  Clydia Llano A  Patient's PCP:  No primary provider on file.  Consults: Treatment Team:  Corky Crafts, MD  Discharge Diagnoses: Present on Admission:  .Palpitations .HTN (hypertension) .Parkinson's disease .Hypercholesteremia .Anemia .Acute renal failure   Medication List  As of 11/28/2011 11:10 AM   TAKE these medications         aspirin EC 81 MG tablet   Take 81 mg by mouth daily.      carbidopa-levodopa 25-100 MG per tablet   Commonly known as: SINEMET IR   Take 2 tablets by mouth 3 (three) times daily.      cloNIDine 0.1 MG tablet   Commonly known as: CATAPRES   Take 0.1 mg by mouth daily as needed. For blood pressure      hydroxypropyl methylcellulose 2.5 % ophthalmic solution   Commonly known as: ISOPTO TEARS   Place 1 drop into both eyes 3 (three) times daily as needed. For dry eyes      lisinopril-hydrochlorothiazide 20-12.5 MG per tablet   Commonly known as: PRINZIDE,ZESTORETIC   Take 1 tablet by mouth daily.      metoprolol 50 MG tablet   Commonly known as: LOPRESSOR   Take 25 mg by mouth 2 (two) times daily.      rasagiline 0.5 MG Tabs   Commonly known as: AZILECT   Take 1 mg by mouth daily.      simvastatin 80 MG tablet   Commonly known as: ZOCOR   Take 40 mg by mouth at bedtime.             Brief Admission History: Kevin Roman is a plesant 78 year old male who has Parkinson's diesease, htn, hyperlipidemia, who followed at the [facility] in [place], who comes in with fluttering of the heart, which he first noticed while at church on Sunday. This lasted a few seconds, but after that he became weak and clammy. He had a similar episode this morning, hence coming to the ED. This is new for him. His wife mentions that he had cold like symptoms about 2 weeks ago, but this  improved on its own. Patient denies any diarrhea or vomiting or fever or chest pain or dizziness, PND or orthopnea. No SOBE. No change in his meds recently.  Hospital Course: Present on Admission:  .Palpitations .HTN (hypertension) .Parkinson's disease .Hypercholesteremia .Anemia .Acute renal failure  1. Palpitations: Patient admitted to the hospital for further evaluation, 12-lead EKG and normal 3 sets of cardiac enzymes ruled out acute coronary syndrome, patient was on telemetry and while on that he did not develop any life-threatening arrhythmias. Telemetry strips reviewed and showed multiple PVCs which for some short period of time for and bigeminy. Cardiology consult was obtained, and Dr. Eldridge Dace was kind enough to see the patient. He recommended if there is no life-threatening arrhythmia to continue patient metoprolol and followup as outpatient to consider 30 day event monitor. Patient is symptoms free at the time of discharge.  2. Acute renal failure: Patient came in with creatinine of 1.7, after aggressive IV fluid hydration patient creatinine went down to 1.3.  3. Hypertension: Blood pressure was reasonable control during this hospital stay, his preadmission medications was continued throughout the hospital stay.  4. Parkinson's disease: This is chronic stable condition continue preadmission  medications.  5. Anemia: We have no baseline of hemoglobin, hemoglobin seems to be stable in the hospital, anemia panel was done and showed normal iron studies folate and B12. Patient needs followup as outpatient.   Day of Discharge BP 161/84  Pulse 73  Temp(Src) 98.4 F (36.9 C) (Oral)  Resp 18  Ht 5\' 8"  (1.727 m)  Wt 81.375 kg (179 lb 6.4 oz)  BMI 27.28 kg/m2  SpO2 95% Physical Exam: GEN: No acute distress, cooperative with exam PSYCH: He is alert and oriented x4; does not appear anxious does not appear depressed; affect is normal  HEENT: Mucous membranes pink and anicteric;    Mouth: without oral thrush or lesions Eyes: PERRLA; EOM intact;  Neck: no cervical lymphadenopathy nor thyromegaly or carotid bruit; no JVD;  CHEST WALL: No tenderness, symmetrical to breathing bilaterally CHEST: Normal respiration, clear to auscultation bilaterally  HEART: Regular rate and rhythm; no murmurs, rubs or gallops, S1 and S2 heard  BACK: No kyphosis or scoliosis; no CVA tenderness  ABDOMEN:  soft non-tender; no masses, no organomegaly, normal abdominal bowel sounds; no pannus; no intertriginous candida.  EXTREMITIES: No bone or joint deformity; no edema; no ulcerations.  PULSES: 2+ and symmetric, neurovascularity is intact SKIN: Normal hydration no rash or ulceration, no flushing or suspicious lesions  CNS: Cranial nerves 2-12 grossly intact no focal neurologic deficit, coordination is intact gait not tested    Results for orders placed during the hospital encounter of 11/26/11 (from the past 24 hour(s))  CARDIAC PANEL(CRET KIN+CKTOT+MB+TROPI)     Status: Normal   Collection Time   11/27/11 11:59 AM      Component Value Range   Total CK 120  7 - 232 (U/L)   CK, MB 1.8  0.3 - 4.0 (ng/mL)   Troponin I <0.30  <0.30 (ng/mL)   Relative Index 1.5  0.0 - 2.5   BASIC METABOLIC PANEL     Status: Abnormal   Collection Time   11/28/11  5:20 AM      Component Value Range   Sodium 141  135 - 145 (mEq/L)   Potassium 4.2  3.5 - 5.1 (mEq/L)   Chloride 105  96 - 112 (mEq/L)   CO2 26  19 - 32 (mEq/L)   Glucose, Bld 90  70 - 99 (mg/dL)   BUN 20  6 - 23 (mg/dL)   Creatinine, Ser 1.61  0.50 - 1.35 (mg/dL)   Calcium 8.8  8.4 - 09.6 (mg/dL)   GFR calc non Af Amer 50 (*) >90 (mL/min)   GFR calc Af Amer 58 (*) >90 (mL/min)    Disposition: Home   Follow-up Appts: Discharge Orders    Future Orders Please Complete By Expires   Diet - low sodium heart healthy      Increase activity slowly         Follow-up Information    Follow up with Corky Crafts., MD in 1 week.   Contact  information:   [address] [place] [address] [phone]          I spent 40 minutes completing paperwork and coordinating discharge efforts.  SignedClydia Llano A 11/28/2011, 11:10 AM

## 2011-11-28 NOTE — Progress Notes (Signed)
D/c orders received;IV removed with gauze on, pt remains in stable condition, pt meds and instructions reviewed and given to pt; pt d/c to home 

## 2015-01-03 ENCOUNTER — Emergency Department (HOSPITAL_COMMUNITY): Payer: Self-pay

## 2015-01-03 ENCOUNTER — Emergency Department (HOSPITAL_COMMUNITY)
Admission: EM | Admit: 2015-01-03 | Discharge: 2015-01-03 | Disposition: A | Discharge status: Home / Self Care | Payer: Self-pay | Attending: Emergency Medicine | Admitting: Emergency Medicine

## 2015-01-03 ENCOUNTER — Encounter (HOSPITAL_COMMUNITY): Payer: Self-pay | Admitting: Emergency Medicine

## 2015-01-03 DIAGNOSIS — R2243 Localized swelling, mass and lump, lower limb, bilateral: Secondary | ICD-10-CM | POA: Diagnosis not present

## 2015-01-03 DIAGNOSIS — G20 Parkinson's disease: Secondary | ICD-10-CM | POA: Insufficient documentation

## 2015-01-03 DIAGNOSIS — R600 Localized edema: Secondary | ICD-10-CM

## 2015-01-03 DIAGNOSIS — Z87891 Personal history of nicotine dependence: Secondary | ICD-10-CM | POA: Diagnosis not present

## 2015-01-03 DIAGNOSIS — Z79899 Other long term (current) drug therapy: Secondary | ICD-10-CM | POA: Diagnosis not present

## 2015-01-03 DIAGNOSIS — Z7982 Long term (current) use of aspirin: Secondary | ICD-10-CM | POA: Diagnosis not present

## 2015-01-03 DIAGNOSIS — I10 Essential (primary) hypertension: Secondary | ICD-10-CM | POA: Insufficient documentation

## 2015-01-03 DIAGNOSIS — R0602 Shortness of breath: Secondary | ICD-10-CM | POA: Diagnosis not present

## 2015-01-03 LAB — BRAIN NATRIURETIC PEPTIDE: B Natriuretic Peptide: 233.2 pg/mL — ABNORMAL HIGH (ref 0.0–100.0)

## 2015-01-03 LAB — COMPREHENSIVE METABOLIC PANEL
ALBUMIN: 3.6 g/dL (ref 3.5–5.0)
ALK PHOS: 66 U/L (ref 38–126)
AST: 14 U/L — ABNORMAL LOW (ref 15–41)
Anion gap: 6 (ref 5–15)
BUN: 22 mg/dL — AB (ref 6–20)
CALCIUM: 8.8 mg/dL — AB (ref 8.9–10.3)
CHLORIDE: 109 mmol/L (ref 101–111)
CO2: 25 mmol/L (ref 22–32)
Creatinine, Ser: 1.49 mg/dL — ABNORMAL HIGH (ref 0.61–1.24)
GFR calc non Af Amer: 42 mL/min — ABNORMAL LOW (ref >60)
GFR, EST AFRICAN AMERICAN: 48 mL/min — AB (ref >60)
Glucose, Bld: 99 mg/dL (ref 65–99)
POTASSIUM: 4.1 mmol/L (ref 3.5–5.1)
SODIUM: 140 mmol/L (ref 135–145)
TOTAL PROTEIN: 6.7 g/dL (ref 6.5–8.1)
Total Bilirubin: 0.5 mg/dL (ref 0.3–1.2)

## 2015-01-03 LAB — CBC WITH DIFFERENTIAL/PLATELET
Basophils Absolute: 0 10*3/uL (ref 0.0–0.1)
Basophils Relative: 1 % (ref 0–1)
EOS ABS: 0.1 10*3/uL (ref 0.0–0.7)
EOS PCT: 2 % (ref 0–5)
HEMATOCRIT: 27.3 % — AB (ref 39.0–52.0)
Hemoglobin: 8.6 g/dL — ABNORMAL LOW (ref 13.0–17.0)
LYMPHS ABS: 1 10*3/uL (ref 0.7–4.0)
Lymphocytes Relative: 24 % (ref 12–46)
MCH: 27 pg (ref 26.0–34.0)
MCHC: 31.5 g/dL (ref 30.0–36.0)
MCV: 85.6 fL (ref 78.0–100.0)
MONOS PCT: 9 % (ref 3–12)
Monocytes Absolute: 0.4 10*3/uL (ref 0.1–1.0)
Neutro Abs: 2.7 10*3/uL (ref 1.7–7.7)
Neutrophils Relative %: 64 % (ref 43–77)
PLATELETS: 189 10*3/uL (ref 150–400)
RBC: 3.19 MIL/uL — ABNORMAL LOW (ref 4.22–5.81)
RDW: 13.7 % (ref 11.5–15.5)
WBC: 4.1 10*3/uL (ref 4.0–10.5)

## 2015-01-03 LAB — TROPONIN I: Troponin I: <0.03 ng/mL (ref <0.031)

## 2015-01-03 MED ORDER — FUROSEMIDE 20 MG PO TABS: 20.0000 mg | ORAL_TABLET | Freq: Every day | ORAL | Status: DC

## 2015-01-03 NOTE — ED Provider Notes (Signed)
CSN: [id number]     Arrival date & time 01/03/15  1323 History   First MD Initiated Contact with Patient 01/03/15 1325     Chief Complaint  Patient presents with  . Shortness of Breath     (Consider location/radiation/quality/duration/timing/severity/associated sxs/prior Treatment) HPI Comments: Patient is an 81 year old male with history of hypertension and Parkinson's disease. He presents for evaluation of shortness of breath which has been worsening over the past several weeks. States that when he gets up to walk across the room he becomes short winded and has to rest. He does report some swelling in his ankles, but denies any chest pain, fever, productive cough. He has no history of cardiac disease and no history of CHF. He also has no history of COPD and is a nonsmoker.  Patient is a 81 y.o. male presenting with shortness of breath. The history is provided by the patient.  Shortness of Breath Severity:  Moderate Onset quality:  Gradual Duration:  2 weeks Timing:  Constant Progression:  Worsening Chronicity:  New Context: activity   Relieved by:  Nothing Worsened by:  Nothing tried Ineffective treatments:  None tried Associated symptoms: no chest pain, no cough and no fever     Past Medical History  Diagnosis Date  . Hypertension   . Parkinson's disease    Past Surgical History  Procedure Laterality Date  . Prostate surgery     No family history on file. History  Substance Use Topics  . Smoking status: Former Games developer  . Smokeless tobacco: Not on file  . Alcohol Use: No    Review of Systems  Constitutional: Negative for fever.  Respiratory: Positive for shortness of breath. Negative for cough.   Cardiovascular: Negative for chest pain.  All other systems reviewed and are negative.     Allergies  Review of patient's allergies indicates no known allergies.  Home Medications   Prior to Admission medications   Medication Sig Start Date End Date Taking?  Authorizing Provider  aspirin EC 81 MG tablet Take 81 mg by mouth daily.    Historical Provider, MD  carbidopa-levodopa (SINEMET IR) 25-100 MG per tablet Take 2 tablets by mouth 3 (three) times daily.    Historical Provider, MD  cloNIDine (CATAPRES) 0.1 MG tablet Take 0.1 mg by mouth daily as needed. For blood pressure    Historical Provider, MD  hydroxypropyl methylcellulose (ISOPTO TEARS) 2.5 % ophthalmic solution Place 1 drop into both eyes 3 (three) times daily as needed. For dry eyes    Historical Provider, MD  lisinopril-hydrochlorothiazide (PRINZIDE,ZESTORETIC) 20-12.5 MG per tablet Take 1 tablet by mouth daily.    Historical Provider, MD  metoprolol (LOPRESSOR) 50 MG tablet Take 25 mg by mouth 2 (two) times daily.    Historical Provider, MD  rasagiline (AZILECT) 0.5 MG TABS Take 1 mg by mouth daily.    Historical Provider, MD  simvastatin (ZOCOR) 80 MG tablet Take 40 mg by mouth at bedtime.    Historical Provider, MD   BP 147/74 mmHg  Pulse 67  Temp(Src) 98.1 F (36.7 C) (Oral)  Resp 18  SpO2 97% Physical Exam  Constitutional: He is oriented to person, place, and time. He appears well-developed and well-nourished. No distress.  HENT:  Head: Normocephalic and atraumatic.  Neck: Normal range of motion. Neck supple.  Cardiovascular: Normal rate, regular rhythm and normal heart sounds.   No murmur heard. Pulmonary/Chest: Effort normal and breath sounds normal. No respiratory distress. He has no wheezes. He has no  rales.  Abdominal: Soft. Bowel sounds are normal. He exhibits no distension. There is no tenderness.  Musculoskeletal: Normal range of motion. He exhibits edema.  There is 2+ pitting edema of the BLE.  Lymphadenopathy:    He has no cervical adenopathy.  Neurological: He is alert and oriented to person, place, and time.  Skin: Skin is warm and dry. He is not diaphoretic.  Nursing note and vitals reviewed.   ED Course  Procedures (including critical care time) Labs  Review Labs Reviewed  COMPREHENSIVE METABOLIC PANEL  TROPONIN I  BRAIN NATRIURETIC PEPTIDE  CBC WITH DIFFERENTIAL/PLATELET    Imaging Review No results found.   EKG Interpretation   Date/Time:  Tuesday January 03 2015 13:23:59 EDT Ventricular Rate:  68 PR Interval:  201 QRS Duration: 87 QT Interval:  435 QTC Calculation: 463 R Axis:   -10 Text Interpretation:  Atrial-paced complexes Borderline T abnormalities,  lateral leads Confirmed by Jedidiah Demartini  MD, Arshad Oberholzer ([id number]) on 01/03/2015 1:47:36  PM      MDM   Final diagnoses:  None    Patient is an 81 year old male with history of Parkinson's disease who presents with complaints of shortness of breath and swelling in his ankles that is worsened over the past several days. His workup reveals slightly worsening anemia, elevated BNP, and chest x-ray which shows cardiomegaly with no pulmonary edema. I suspect this is a fluid issue and we'll treat with a small dose of Lasix and follow-up with his primary Dr.    Geoffery Lyonsouglas Mykaela Arena, MD 01/03/15 [phone]

## 2015-01-03 NOTE — ED Notes (Signed)
Pt back from x-ray.

## 2015-01-03 NOTE — ED Notes (Signed)
Pt transporting to xray.  

## 2015-01-03 NOTE — Discharge Instructions (Signed)
Lasix as prescribed.  Return to the emergency department if symptoms significantly worsen or change.  Follow up with your primary Dr. in one week for a recheck.   Peripheral Edema You have swelling in your legs (peripheral edema). This swelling is due to excess accumulation of salt and water in your body. Edema may be a sign of heart, kidney or liver disease, or a side effect of a medication. It may also be due to problems in the leg veins. Elevating your legs and using special support stockings may be very helpful, if the cause of the swelling is due to poor venous circulation. Avoid long periods of standing, whatever the cause. Treatment of edema depends on identifying the cause. Chips, pretzels, pickles and other salty foods should be avoided. Restricting salt in your diet is almost always needed. Water pills (diuretics) are often used to remove the excess salt and water from your body via urine. These medicines prevent the kidney from reabsorbing sodium. This increases urine flow. Diuretic treatment may also result in lowering of potassium levels in your body. Potassium supplements may be needed if you have to use diuretics daily. Daily weights can help you keep track of your progress in clearing your edema. You should call your caregiver for follow up care as recommended. SEEK IMMEDIATE MEDICAL CARE IF:   You have increased swelling, pain, redness, or heat in your legs.  You develop shortness of breath, especially when lying down.  You develop chest or abdominal pain, weakness, or fainting.  You have a fever. Document Released: 07/25/2004 Document Revised: 09/09/2011 Document Reviewed: 07/05/2009 [facility] Patient Information 2015 [place], [place]. This information is not intended to replace advice given to you by your health care provider. Make sure you discuss any questions you have with your health care provider.

## 2015-01-03 NOTE — ED Notes (Signed)
To ED via [facility] from home with c/o increased shortness of breath. Pt has 2 + pitting edema in ankles/lower legs. No hx of same. Pt is alert/oriented x 4, w/d-- hx of parkinson's.

## 2016-07-21 ENCOUNTER — Emergency Department (HOSPITAL_COMMUNITY): Payer: Medicare Other

## 2016-07-21 ENCOUNTER — Inpatient Hospital Stay (HOSPITAL_COMMUNITY): Payer: Medicare Other

## 2016-07-21 ENCOUNTER — Encounter (HOSPITAL_COMMUNITY): Payer: Self-pay

## 2016-07-21 ENCOUNTER — Inpatient Hospital Stay (HOSPITAL_COMMUNITY)
Admission: EM | Admit: 2016-07-21 | Discharge: 2016-07-29 | DRG: 351 | Disposition: A | Discharge status: Skilled Nursing Facility | Payer: Medicare Other | Attending: Internal Medicine | Admitting: Internal Medicine

## 2016-07-21 DIAGNOSIS — Z4682 Encounter for fitting and adjustment of non-vascular catheter: Secondary | ICD-10-CM | POA: Diagnosis not present

## 2016-07-21 DIAGNOSIS — K7689 Other specified diseases of liver: Secondary | ICD-10-CM | POA: Diagnosis not present

## 2016-07-21 DIAGNOSIS — R451 Restlessness and agitation: Secondary | ICD-10-CM | POA: Diagnosis present

## 2016-07-21 DIAGNOSIS — K56600 Partial intestinal obstruction, unspecified as to cause: Secondary | ICD-10-CM | POA: Diagnosis present

## 2016-07-21 DIAGNOSIS — K4030 Unilateral inguinal hernia, with obstruction, without gangrene, not specified as recurrent: Principal | ICD-10-CM | POA: Diagnosis present

## 2016-07-21 DIAGNOSIS — Z781 Physical restraint status: Secondary | ICD-10-CM

## 2016-07-21 DIAGNOSIS — K460 Unspecified abdominal hernia with obstruction, without gangrene: Secondary | ICD-10-CM | POA: Diagnosis present

## 2016-07-21 DIAGNOSIS — Z87891 Personal history of nicotine dependence: Secondary | ICD-10-CM | POA: Diagnosis not present

## 2016-07-21 DIAGNOSIS — M6281 Muscle weakness (generalized): Secondary | ICD-10-CM | POA: Diagnosis not present

## 2016-07-21 DIAGNOSIS — D649 Anemia, unspecified: Secondary | ICD-10-CM | POA: Diagnosis not present

## 2016-07-21 DIAGNOSIS — I10 Essential (primary) hypertension: Secondary | ICD-10-CM | POA: Diagnosis present

## 2016-07-21 DIAGNOSIS — I129 Hypertensive chronic kidney disease with stage 1 through stage 4 chronic kidney disease, or unspecified chronic kidney disease: Secondary | ICD-10-CM | POA: Diagnosis not present

## 2016-07-21 DIAGNOSIS — K5649 Other impaction of intestine: Secondary | ICD-10-CM | POA: Diagnosis not present

## 2016-07-21 DIAGNOSIS — R100 Acute abdomen: Secondary | ICD-10-CM | POA: Diagnosis not present

## 2016-07-21 DIAGNOSIS — Z8546 Personal history of malignant neoplasm of prostate: Secondary | ICD-10-CM | POA: Diagnosis not present

## 2016-07-21 DIAGNOSIS — Z0189 Encounter for other specified special examinations: Secondary | ICD-10-CM

## 2016-07-21 DIAGNOSIS — R410 Disorientation, unspecified: Secondary | ICD-10-CM | POA: Diagnosis not present

## 2016-07-21 DIAGNOSIS — R1110 Vomiting, unspecified: Secondary | ICD-10-CM | POA: Diagnosis not present

## 2016-07-21 DIAGNOSIS — E876 Hypokalemia: Secondary | ICD-10-CM | POA: Diagnosis present

## 2016-07-21 DIAGNOSIS — N433 Hydrocele, unspecified: Secondary | ICD-10-CM | POA: Diagnosis not present

## 2016-07-21 DIAGNOSIS — E870 Hyperosmolality and hypernatremia: Secondary | ICD-10-CM | POA: Diagnosis not present

## 2016-07-21 DIAGNOSIS — K56609 Unspecified intestinal obstruction, unspecified as to partial versus complete obstruction: Secondary | ICD-10-CM | POA: Diagnosis not present

## 2016-07-21 DIAGNOSIS — K625 Hemorrhage of anus and rectum: Secondary | ICD-10-CM | POA: Diagnosis present

## 2016-07-21 DIAGNOSIS — Z972 Presence of dental prosthetic device (complete) (partial): Secondary | ICD-10-CM

## 2016-07-21 DIAGNOSIS — I509 Heart failure, unspecified: Secondary | ICD-10-CM

## 2016-07-21 DIAGNOSIS — Z7982 Long term (current) use of aspirin: Secondary | ICD-10-CM | POA: Diagnosis not present

## 2016-07-21 DIAGNOSIS — N4400 Torsion of testis, unspecified: Secondary | ICD-10-CM

## 2016-07-21 DIAGNOSIS — R1111 Vomiting without nausea: Secondary | ICD-10-CM | POA: Diagnosis not present

## 2016-07-21 DIAGNOSIS — K4090 Unilateral inguinal hernia, without obstruction or gangrene, not specified as recurrent: Secondary | ICD-10-CM | POA: Diagnosis not present

## 2016-07-21 DIAGNOSIS — D72829 Elevated white blood cell count, unspecified: Secondary | ICD-10-CM | POA: Diagnosis not present

## 2016-07-21 DIAGNOSIS — K219 Gastro-esophageal reflux disease without esophagitis: Secondary | ICD-10-CM | POA: Diagnosis not present

## 2016-07-21 DIAGNOSIS — Z79899 Other long term (current) drug therapy: Secondary | ICD-10-CM | POA: Diagnosis not present

## 2016-07-21 DIAGNOSIS — E7800 Pure hypercholesterolemia, unspecified: Secondary | ICD-10-CM | POA: Diagnosis not present

## 2016-07-21 DIAGNOSIS — R6889 Other general symptoms and signs: Secondary | ICD-10-CM | POA: Diagnosis not present

## 2016-07-21 DIAGNOSIS — R110 Nausea: Secondary | ICD-10-CM | POA: Diagnosis not present

## 2016-07-21 DIAGNOSIS — R2689 Other abnormalities of gait and mobility: Secondary | ICD-10-CM | POA: Diagnosis not present

## 2016-07-21 DIAGNOSIS — N183 Chronic kidney disease, stage 3 (moderate): Secondary | ICD-10-CM | POA: Diagnosis present

## 2016-07-21 DIAGNOSIS — D638 Anemia in other chronic diseases classified elsewhere: Secondary | ICD-10-CM | POA: Diagnosis not present

## 2016-07-21 DIAGNOSIS — E785 Hyperlipidemia, unspecified: Secondary | ICD-10-CM | POA: Diagnosis not present

## 2016-07-21 DIAGNOSIS — R109 Unspecified abdominal pain: Secondary | ICD-10-CM | POA: Diagnosis not present

## 2016-07-21 DIAGNOSIS — G20 Parkinson's disease: Secondary | ICD-10-CM | POA: Diagnosis present

## 2016-07-21 DIAGNOSIS — Z4659 Encounter for fitting and adjustment of other gastrointestinal appliance and device: Secondary | ICD-10-CM

## 2016-07-21 LAB — TYPE AND SCREEN
ABO/RH(D): B POS
Antibody Screen: NEGATIVE

## 2016-07-21 LAB — COMPREHENSIVE METABOLIC PANEL
ALBUMIN: 4.5 g/dL (ref 3.5–5.0)
ALK PHOS: 71 U/L (ref 38–126)
ALT: 11 U/L — AB (ref 17–63)
AST: 19 U/L (ref 15–41)
Anion gap: 10 (ref 5–15)
BUN: 30 mg/dL — ABNORMAL HIGH (ref 6–20)
CHLORIDE: 105 mmol/L (ref 101–111)
CO2: 26 mmol/L (ref 22–32)
CREATININE: 1.28 mg/dL — AB (ref 0.61–1.24)
Calcium: 9.6 mg/dL (ref 8.9–10.3)
GFR calc Af Amer: 58 mL/min — ABNORMAL LOW (ref >60)
GFR calc non Af Amer: 50 mL/min — ABNORMAL LOW (ref >60)
GLUCOSE: 132 mg/dL — AB (ref 65–99)
Potassium: 3.7 mmol/L (ref 3.5–5.1)
SODIUM: 141 mmol/L (ref 135–145)
Total Bilirubin: 0.6 mg/dL (ref 0.3–1.2)
Total Protein: 7.8 g/dL (ref 6.5–8.1)

## 2016-07-21 LAB — CBC WITH DIFFERENTIAL/PLATELET
BASOS ABS: 0 10*3/uL (ref 0.0–0.1)
BASOS PCT: 0 %
EOS ABS: 0 10*3/uL (ref 0.0–0.7)
Eosinophils Relative: 0 %
HCT: 33.8 % — ABNORMAL LOW (ref 39.0–52.0)
Hemoglobin: 11.1 g/dL — ABNORMAL LOW (ref 13.0–17.0)
LYMPHS ABS: 0.5 10*3/uL — AB (ref 0.7–4.0)
Lymphocytes Relative: 8 %
MCH: 29.1 pg (ref 26.0–34.0)
MCHC: 32.8 g/dL (ref 30.0–36.0)
MCV: 88.5 fL (ref 78.0–100.0)
Monocytes Absolute: 0.2 10*3/uL (ref 0.1–1.0)
Monocytes Relative: 4 %
NEUTROS PCT: 88 %
Neutro Abs: 5.6 10*3/uL (ref 1.7–7.7)
PLATELETS: 170 10*3/uL (ref 150–400)
RBC: 3.82 MIL/uL — AB (ref 4.22–5.81)
RDW: 14 % (ref 11.5–15.5)
WBC: 6.3 10*3/uL (ref 4.0–10.5)

## 2016-07-21 LAB — URINALYSIS, ROUTINE W REFLEX MICROSCOPIC
BACTERIA UA: NONE SEEN
BILIRUBIN URINE: NEGATIVE
Glucose, UA: NEGATIVE mg/dL
Hgb urine dipstick: NEGATIVE
KETONES UR: NEGATIVE mg/dL
Leukocytes, UA: NEGATIVE
Nitrite: NEGATIVE
PROTEIN: 30 mg/dL — AB
Specific Gravity, Urine: 1.042 — ABNORMAL HIGH (ref 1.005–1.030)
pH: 5 (ref 5.0–8.0)

## 2016-07-21 LAB — POC OCCULT BLOOD, ED: Fecal Occult Bld: NEGATIVE

## 2016-07-21 LAB — I-STAT CG4 LACTIC ACID, ED: Lactic Acid, Venous: 0.84 mmol/L (ref 0.5–1.9)

## 2016-07-21 LAB — ABO/RH: ABO/RH(D): B POS

## 2016-07-21 MED ORDER — DEXTROSE-NACL 5-0.9 % IV SOLN
INTRAVENOUS | Status: DC
  Administered 2016-07-21 – 2016-07-23 (×3): via INTRAVENOUS

## 2016-07-21 MED ORDER — METOPROLOL TARTRATE 25 MG PO TABS
25.0000 mg | ORAL_TABLET | Freq: Two times a day (BID) | ORAL | Status: DC
  Administered 2016-07-24 – 2016-07-25 (×3): 25 mg via ORAL
  Filled 2016-07-21 (×4): qty 1

## 2016-07-21 MED ORDER — FOLIC ACID 1 MG PO TABS
1.0000 mg | ORAL_TABLET | Freq: Every day | ORAL | Status: DC
  Filled 2016-07-21: qty 1

## 2016-07-21 MED ORDER — SODIUM CHLORIDE 0.9 % IV BOLUS (SEPSIS)
1000.0000 mL | Freq: Once | INTRAVENOUS | Status: AC
  Administered 2016-07-21: 1000 mL via INTRAVENOUS

## 2016-07-21 MED ORDER — ONDANSETRON HCL 4 MG/2ML IJ SOLN
4.0000 mg | Freq: Once | INTRAMUSCULAR | Status: AC
  Administered 2016-07-21: 4 mg via INTRAVENOUS

## 2016-07-21 MED ORDER — ACETAMINOPHEN 650 MG RE SUPP
650.0000 mg | Freq: Four times a day (QID) | RECTAL | Status: DC | PRN
  Administered 2016-07-22: 650 mg via RECTAL
  Filled 2016-07-21 (×2): qty 1

## 2016-07-21 MED ORDER — ONDANSETRON HCL 4 MG PO TABS: 4.0000 mg | ORAL_TABLET | Freq: Four times a day (QID) | ORAL | Status: DC | PRN

## 2016-07-21 MED ORDER — PIPERACILLIN-TAZOBACTAM 3.375 G IVPB
3.3750 g | Freq: Once | INTRAVENOUS | Status: AC
  Administered 2016-07-21: 3.375 g via INTRAVENOUS
  Filled 2016-07-21: qty 50

## 2016-07-21 MED ORDER — ONDANSETRON HCL 4 MG/2ML IJ SOLN: 4.0000 mg | Freq: Three times a day (TID) | INTRAMUSCULAR | Status: DC | PRN

## 2016-07-21 MED ORDER — PANTOPRAZOLE SODIUM 40 MG IV SOLR
40.0000 mg | Freq: Once | INTRAVENOUS | Status: AC
  Administered 2016-07-21: 40 mg via INTRAVENOUS
  Filled 2016-07-21: qty 40

## 2016-07-21 MED ORDER — ONDANSETRON HCL 4 MG/2ML IJ SOLN
4.0000 mg | Freq: Once | INTRAMUSCULAR | Status: DC
  Filled 2016-07-21: qty 2

## 2016-07-21 MED ORDER — CLONIDINE HCL 0.1 MG PO TABS: 0.1000 mg | ORAL_TABLET | Freq: Every day | ORAL | Status: DC | PRN

## 2016-07-21 MED ORDER — ONDANSETRON HCL 4 MG/2ML IJ SOLN: 4.0000 mg | Freq: Four times a day (QID) | INTRAMUSCULAR | Status: DC | PRN

## 2016-07-21 MED ORDER — CALCIUM CARBONATE-VITAMIN D 500-200 MG-UNIT PO TABS
2.0000 | ORAL_TABLET | Freq: Every day | ORAL | Status: DC
  Administered 2016-07-25: 11:00:00 2 via ORAL
  Filled 2016-07-21: qty 2

## 2016-07-21 MED ORDER — CARBIDOPA-LEVODOPA 25-100 MG PO TABS: 2.0000 | ORAL_TABLET | Freq: Three times a day (TID) | ORAL | Status: DC

## 2016-07-21 MED ORDER — IOPAMIDOL (ISOVUE-300) INJECTION 61%
100.0000 mL | Freq: Once | INTRAVENOUS | Status: AC | PRN
  Administered 2016-07-21: 100 mL via INTRAVENOUS

## 2016-07-21 MED ORDER — ENOXAPARIN SODIUM 40 MG/0.4ML ~~LOC~~ SOLN
40.0000 mg | Freq: Every day | SUBCUTANEOUS | Status: DC
  Administered 2016-07-21 – 2016-07-25 (×5): 40 mg via SUBCUTANEOUS
  Filled 2016-07-21 (×5): qty 0.4

## 2016-07-21 MED ORDER — POLYVINYL ALCOHOL 1.4 % OP SOLN: 1.0000 [drp] | Freq: Three times a day (TID) | OPHTHALMIC | Status: DC | PRN

## 2016-07-21 MED ORDER — ACETAMINOPHEN 325 MG PO TABS: 650.0000 mg | ORAL_TABLET | Freq: Four times a day (QID) | ORAL | Status: DC | PRN

## 2016-07-21 NOTE — ED Triage Notes (Signed)
Per EMS, pt from home.  Pt with dark tarry stools for 1 week.  [facility] and told to come in if it worse.  Pt had emesis with dark content this morning x 1.  No pain.  HX GI surgery.  No thinners.  A/O x 4.  Vitals: 160/98, hr 72, resp 16,

## 2016-07-21 NOTE — ED Notes (Signed)
US at bedside

## 2016-07-21 NOTE — ED Notes (Signed)
[facility].

## 2016-07-21 NOTE — ED Notes (Signed)
Bed: [facility] Expected date:  Expected time:  Means of arrival:  Comments: 

## 2016-07-21 NOTE — H&P (Signed)
History and Physical    [name]:[id number] DOB: 1930/03/29 DOA: 07/21/2016  PCP: No PCP Per Patient   Patient coming from: Home  Chief Complaint: Nausea and vomiting.   HPI: Kevin Roman is a 83 y.o. male with medical history significant of parkinson's disease who presents with severe and persistent nausea and vomiting. Early this am patient started vomiting multiple times, associated with nausea but no abdominal pain, no improving or worsening factors. Emesis was noted to be dark as well as stools, patient's family called the [place] and advised to call EMS.  Patient had selfreselved nausea and vomiting 7 days ago.   Uses a walker for ambulation.    ED Course: Diagnosed with incarcerated inguinal hernia with bowel obstruction, consulted surgery and referred to Hospital Medicine for admission.   Review of Systems: 10 point review of systems was performed negative besides history of present illness.   Past Medical History:  Diagnosis Date  . Hypertension   . Parkinson's disease     Past Surgical History:  Procedure Laterality Date  . PROSTATE SURGERY       reports that he has quit smoking. He does not have any smokeless tobacco history on file. He reports that he does not drink alcohol or use drugs.  No Known Allergies  History reviewed. No pertinent family history. Unacceptable: Noncontributory, unremarkable, or negative. Acceptable: Family history reviewed and not pertinent (If you reviewed it)  Prior to Admission medications   Medication Sig Start Date End Date Taking? Authorizing Provider  Calcium Carbonate-Vitamin D3 (CALCIUM 600/VITAMIN D) 600-400 MG-UNIT TABS Take 2 tablets by mouth daily.   Yes Historical Provider, MD  carbidopa-levodopa (SINEMET IR) 25-100 MG per tablet Take 2 tablets by mouth 3 (three) times daily.   Yes Historical Provider, MD  cloNIDine (CATAPRES) 0.1 MG tablet Take 0.1 mg by mouth daily as needed. For blood pressure   Yes Historical  Provider, MD  folic acid (FOLVITE) 1 MG tablet Take 1 mg by mouth daily.   Yes Historical Provider, MD  hydroxypropyl methylcellulose (ISOPTO TEARS) 2.5 % ophthalmic solution Place 1 drop into both eyes 3 (three) times daily as needed. For dry eyes   Yes Historical Provider, MD  metoprolol (LOPRESSOR) 50 MG tablet Take 25 mg by mouth 2 (two) times daily.   Yes Historical Provider, MD  furosemide (LASIX) 20 MG tablet Take 1 tablet (20 mg total) by mouth daily. Patient not taking: Reported on 07/21/2016 01/03/15   Geoffery Lyons, MD    Physical Exam: Vitals:   07/21/16 1230 07/21/16 1245 07/21/16 1300 07/21/16 1424  BP: 148/74  154/76 (!) 150/52  Pulse: 76 72 72 80  Resp:    16  Temp:      TempSrc:      SpO2: 97% 100% 97% 94%      Constitutional:deconditioned and ill looking appearing.  Vitals:   07/21/16 1230 07/21/16 1245 07/21/16 1300 07/21/16 1424  BP: 148/74  154/76 (!) 150/52  Pulse: 76 72 72 80  Resp:    16  Temp:      TempSrc:      SpO2: 97% 100% 97% 94%   Eyes: PERRL, lids and conjunctivae mild pale with no icterus.  Head normocephalic, nose and ears with no deformities.  ENMT: Mucous membranes are dry. Posterior pharynx clear of any exudate or lesions.Normal dentition.  Neck: normal, supple, no masses, no thyromegaly Respiratory: clear to auscultation bilaterally, no wheezing, no crackles. Normal respiratory effort. No accessory muscle  use. Decreased breath sounds at bases.  Cardiovascular: Regular rate and rhythm, no murmurs / rubs / gallops. No extremity edema. 2+ pedal pulses. No carotid bruits.  Abdomen: Positive distention, tympanic to percussion, increased bowel sounds, non tenderness, no masses palpated. No hepatosplenomegaly. Noted scrotal mass, non tender, not reducible.    Musculoskeletal: no clubbing / cyanosis. No joint deformity upper and lower extremities. Good ROM, no contractures. Normal muscle tone.  Skin: no rashes, lesions, ulcers. No  induration Neurologic: CN 2-12 grossly intact. Sensation intact, DTR normal. Strength 5/5 in all 4. Positive resting tremors.    Labs on Admission: I have personally reviewed following labs and imaging studies  CBC:  Recent Labs Lab 07/21/16 1150  WBC 6.3  NEUTROABS 5.6  HGB 11.1*  HCT 33.8*  MCV 88.5  PLT 170   Basic Metabolic Panel:  Recent Labs Lab 07/21/16 1150  NA 141  K 3.7  CL 105  CO2 26  GLUCOSE 132*  BUN 30*  CREATININE 1.28*  CALCIUM 9.6   GFR: CrCl cannot be calculated (Unknown ideal weight.). Liver Function Tests:  Recent Labs Lab 07/21/16 1150  AST 19  ALT 11*  ALKPHOS 71  BILITOT 0.6  PROT 7.8  ALBUMIN 4.5   No results for input(s): LIPASE, AMYLASE in the last 168 hours. No results for input(s): AMMONIA in the last 168 hours. Coagulation Profile: No results for input(s): INR, PROTIME in the last 168 hours. Cardiac Enzymes: No results for input(s): CKTOTAL, CKMB, CKMBINDEX, TROPONINI in the last 168 hours. BNP (last 3 results) No results for input(s): PROBNP in the last 8760 hours. HbA1C: No results for input(s): HGBA1C in the last 72 hours. CBG: No results for input(s): GLUCAP in the last 168 hours. Lipid Profile: No results for input(s): CHOL, HDL, LDLCALC, TRIG, CHOLHDL, LDLDIRECT in the last 72 hours. Thyroid Function Tests: No results for input(s): TSH, T4TOTAL, FREET4, T3FREE, THYROIDAB in the last 72 hours. Anemia Panel: No results for input(s): VITAMINB12, FOLATE, FERRITIN, TIBC, IRON, RETICCTPCT in the last 72 hours. Urine analysis:    Component Value Date/Time   COLORURINE YELLOW 07/21/2016 1405   APPEARANCEUR CLEAR 07/21/2016 1405   LABSPEC 1.042 (H) 07/21/2016 1405   PHURINE 5.0 07/21/2016 1405   GLUCOSEU NEGATIVE 07/21/2016 1405   HGBUR NEGATIVE 07/21/2016 1405   BILIRUBINUR NEGATIVE 07/21/2016 1405   KETONESUR NEGATIVE 07/21/2016 1405   PROTEINUR 30 (A) 07/21/2016 1405   NITRITE NEGATIVE 07/21/2016 1405    LEUKOCYTESUR NEGATIVE 07/21/2016 1405   Sepsis Labs: !!!!!!!!!!!!!!!!!!!!!!!!!!!!!!!!!!!!!!!!!!!! @LABRCNTIP (procalcitonin:4,lacticidven:4) )No results found for this or any previous visit (from the past 240 hour(s)).   Radiological Exams on Admission: [place] Scrotum  Result Date: 07/21/2016 CLINICAL DATA:  Right testicular pain for 2 weeks. EXAM: SCROTAL ULTRASOUND DOPPLER ULTRASOUND OF THE TESTICLES TECHNIQUE: Complete ultrasound examination of the testicles, epididymis, and other scrotal structures was performed. Color and spectral Doppler ultrasound were also utilized to evaluate blood flow to the testicles. COMPARISON:  None. FINDINGS: Right testicle Measurements: 3.5 x 1.8 x 2.1 cm. No mass or microlithiasis visualized. Striated bilateral testicles, right greater than left, without a focal mass likely reflecting prior orchitis for fibrosis. Left testicle Measurements: 3.5 x 1.9 x 2.4 cm. No mass or microlithiasis visualized. Striated bilateral testicles, right greater than left, without a focal mass likely reflecting prior orchitis for fibrosis Right epididymis: 2 cystic structures in the epididymal tail measuring 3 and 4 mm respectively likely reflecting epididymal cysts. Otherwise normal in size and appearance. Left epididymis:  Normal  in size and appearance. Hydrocele:  Large left hydrocele.  Moderate right hydrocele. Varicocele:  None visualized. Pulsed Doppler interrogation of both testes demonstrates normal low resistance arterial and venous waveforms bilaterally. IMPRESSION: 1. No testicular torsion. 2. Electronically Signed   By: Elige Ko   On: 07/21/2016 14:06   Ct Abdomen Pelvis W Contrast  Result Date: 07/21/2016 CLINICAL DATA:  Her abdominal pain, dark stool for 1 week, vomiting this morning EXAM: CT ABDOMEN AND PELVIS WITH CONTRAST TECHNIQUE: Multidetector CT imaging of the abdomen and pelvis was performed using the standard protocol following bolus administration of intravenous  contrast. CONTRAST:  ISOVUE-300 IOPAMIDOL (ISOVUE-300) INJECTION 61% COMPARISON:  CT report 12/27/ 2000 no images available and scrotal ultrasound 07/21/2016 FINDINGS: Lower chest: Lung bases shows mild atelectasis or scarring left base anterolaterally. Hepatobiliary: There are indeterminate scattered low-density lesions within liver the largest in axial image 15 anterior aspect of left hepatic lobe consistent with a cyst. Given history of prostate cancer follow-up nonemergent enhanced MRI is recommended for further characterization. Pancreas: Enhanced pancreas with normal appearance without focal abnormality. Spleen: Normal appearance without focal abnormality. Adrenals/Urinary Tract: No adrenal gland mass. Enhanced kidneys are symmetrical in size. No hydronephrosis or hydroureter. Delayed renal images shows bilateral renal symmetrical excretion. Bilateral visualized proximal ureter is unremarkable. The patient is status post post prostatectomy. Bilateral distal ureter is unremarkable. The urinary bladder is under distended. Stomach/Bowel: There is distension of the stomach with fluid and some air-fluid level. Significant small bowel distension with multiple air-fluid levels. There is a left inguinal scrotal canal hernia measures 6 by 7.5 cm containing moderate distended small bowel lobe. The exiting loop from the hernia axial image 74 is small caliber. Findings are consistent with small bowel obstruction due to incarcerated left inguinal hernia. Small to moderate fluid/ hydrocele noted within lower aspect of the left inguinal scrotal hernia. There is a small right hydrocele. Postsurgical changes are noted within right colon/cecum. Moderate stool noted within right colon transverse colon descending colon and rectosigmoid colon. No evidence of distal colonic obstruction. Vascular/Lymphatic: Atherosclerotic mild atherosclerotic calcifications of abdominal aorta and iliac arteries. No aortic aneurysm. No  adenopathy. Reproductive: The patient is status post post proctectomy. Bilateral distal ureter is unremarkable. No pelvic mass. Other: No ascites or free abdominal air. Musculoskeletal: Sagittal images of the spine shows no destructive bony lesions. Degenerative changes are noted lumbar spine. Degenerative changes bilateral SI joints and pubic symphysis. No destructive bony lesions are noted within pelvis. IMPRESSION: 1. There is distension of the stomach with fluid and some air-fluid level. Significant small bowel distension with multiple air-fluid levels. There is a left inguinal scrotal canal hernia measures 6 by 7.5 cm containing moderate distended small bowel lobe. The exiting loop from the hernia axial image 74 is small caliber. Findings are consistent with small bowel obstruction due to incarcerated left inguinal hernia. 2. There are indeterminate scattered low-density lesions within liver. The largest in left hepatic lobe anteriorly measures 1.4 cm consistent with a cyst. Given history of prostate cancer follow-up examination nonemergent enhanced MRI is recommended for further evaluation. 3. No hydronephrosis or hydroureter. 4. Moderate fluid/hydrocele noted in inferior aspect of left inguinal scrotal canal hernia. Small left hydrocele noted in right lower scrotal canal. 5. Moderate stool noted within colon. Postsurgical changes are noted within cecum. 6. Degenerative changes lumbar spine SI joints and pubic symphysis. No destructive bony lesions are noted. These results were called by telephone at the time of interpretation on 07/21/2016 at 3:12  pm to Dr. Cheron Schaumann , who verbally acknowledged these results. Electronically Signed   By: Natasha Mead M.D.   On: 07/21/2016 15:13   [place] Art/ven Flow Abd Pelv Doppler  Result Date: 07/21/2016 CLINICAL DATA:  Right testicular pain for 2 weeks. EXAM: SCROTAL ULTRASOUND DOPPLER ULTRASOUND OF THE TESTICLES TECHNIQUE: Complete ultrasound examination of the testicles,  epididymis, and other scrotal structures was performed. Color and spectral Doppler ultrasound were also utilized to evaluate blood flow to the testicles. COMPARISON:  None. FINDINGS: Right testicle Measurements: 3.5 x 1.8 x 2.1 cm. No mass or microlithiasis visualized. Striated bilateral testicles, right greater than left, without a focal mass likely reflecting prior orchitis for fibrosis. Left testicle Measurements: 3.5 x 1.9 x 2.4 cm. No mass or microlithiasis visualized. Striated bilateral testicles, right greater than left, without a focal mass likely reflecting prior orchitis for fibrosis Right epididymis: 2 cystic structures in the epididymal tail measuring 3 and 4 mm respectively likely reflecting epididymal cysts. Otherwise normal in size and appearance. Left epididymis:  Normal in size and appearance. Hydrocele:  Large left hydrocele.  Moderate right hydrocele. Varicocele:  None visualized. Pulsed Doppler interrogation of both testes demonstrates normal low resistance arterial and venous waveforms bilaterally. IMPRESSION: 1. No testicular torsion. 2. Electronically Signed   By: Elige Ko   On: 07/21/2016 14:06    EKG: Independently reviewed. NA  Assessment/Plan Active Problems:   SBO (small bowel obstruction)   This is an 83 year old male who has Parkinson's disease, he had developed abruptly nausea and vomiting, abdominal pain. On initial physical examination blood pressure 163/89, heart rate 81, respiratory rate 16 with oxygen saturation 94%. His oral mucosa is mildly dry, his abdomen is distended, tympanic to percussion, increased bowel sounds. Sodium 141, potassium 3.7 chloride 105, bicarb 26, glucose 132, creatinine 1.28, white count 6.3, hemoglobin 11.1, hematocrit 33.8, platelets 170. Lactic acid 0.8, urinalysis negative for infection. CT of the abdomen showing small bowel obstruction due to incarcerated left inguinal hernia. Ultrasound with no testicular torsion.  Working diagnosis:  Abdominal pain, nausea and vomiting due to small bowel obstruction due to incarcerated left inguinal hernia.  1. Small bowel obstruction. The patient will be admitted to the medical floor, NG tube will be placed to low intermittent suction. Supportive IV fluids, antiemetics and analgesics. Surgery has been consulted.  2. Hypertension. Continue metoprolol, hold furosemide, patient takes clonidine as needed.  3. Parkinson's disease. Continue Sinemet per his home regimen.  4. Chronic kidney disease. Stage III, GFR calculated at 59. Will continue supportive IV fluids, follow up renal panel in the morning.   DVT prophylaxis: enoxaparin.  Code Status: Full  Family Communication: Daughter at the bedside and all questions were addressed.  Disposition Plan: home  Consults called: surgery Admission status: Inpatient  Jonnathan Birman Annett Gula MD [facility] Pager [phone]  If 7PM-7AM, please contact night-coverage [email or web address] Password [email or web address]  07/21/2016, 3:52 PM

## 2016-07-21 NOTE — Consult Note (Signed)
[facility] Surgery Consult Note  Kevin Roman Nov 17, 1929  [id number].    Requesting MD: Tegeler Chief Complaint/Reason for Consult: Small bowel obstruction  HPI:  Kevin Roman is a 83 y.o. AA male with a significant medical history for Parkinson's disease, hypertension, and previous hernia repair who presented to [facility] with vomiting. States he developed N/V and abdominal pain last Sunday (07/14/16) but it stopped. He began having abdominal pain again yesterday but it quickly declined. Reported he began feeling too weak to walk on his own yesterday. He became nauseous and began vomiting the am. Denies hematemesis. Reports decreased appetite and thirst over the past week. Reports black tarry stools for the past day. States he has had scrotal swelling for approximately the past year.  ED workup: - Afebrile  - FOCT: negative - Lactic acid: 0.84 - WBC 6.3, Hgb 11.1, Hct 33.8 -  CT abdomen/pelvis: Distension of stomach and small bowel with air fluid levels. Left inguinal scrotal canal hernia containing small bowel lobe. Hydrocele of left inguinal scrotal canal.  - US scrotum: large left hydrocele and moderate right hydrocele  Current blood thinners: none Residence: home with wife and son. Patient has home health nurse twice week to help with bathing, etc. Prior abdominal surgery: hernia repair 1 year ago  ROS: Review of Systems  Constitutional: Negative for chills and fever.  Respiratory: Negative for cough and shortness of breath.   Cardiovascular: Positive for leg swelling. Negative for chest pain and palpitations.  Gastrointestinal: Positive for nausea and vomiting. Negative for abdominal pain, constipation and diarrhea.  Genitourinary: Positive for frequency and urgency. Negative for hematuria.  Neurological: Positive for weakness. Negative for dizziness and loss of consciousness.  All other systems reviewed and are negative.   History reviewed. No pertinent family  history.  Past Medical History:  Diagnosis Date  . Hypertension   . Parkinson's disease     Past Surgical History:  Procedure Laterality Date  . PROSTATE SURGERY      Social History:  reports that he has quit smoking. He does not have any smokeless tobacco history on file. He reports that he does not drink alcohol or use drugs.  Allergies: No Known Allergies   (Not in a hospital admission)  Blood pressure (!) 150/52, pulse 80, temperature 98.5 F (36.9 C), temperature source Oral, resp. rate 16, SpO2 94 %. Physical Exam: General: pleasant, WD/WN AA male who is laying in bed in NAD HEENT: head is normocephalic, atraumatic.  Sclera are noninjected. Mouth is pink and moist Heart: regular, rate, and rhythm.  No obvious murmurs, gallops, or rubs noted.  Lungs: CTAB, no wheezes, rhonchi, or rales noted.  Respiratory effort nonlabored Abd: soft, distended, nontender, hypoactive BS, left inguinal hernia extending into scrotum is nontender and partially reducible MS: all 4 extremities are symmetrical with no cyanosis or clubbing, edema of bilateral legs Skin: warm and dry with no masses, lesions, or rashes Psych: A&Ox3 with an appropriate affect. Neuro: CM 2-12 grossly intact, extremity CSM intact bilaterally, normal speech  Results for orders placed or performed during the hospital encounter of 07/21/16 (from the past 48 hour(s))  CBC with Differential/Platelet     Status: Abnormal   Collection Time: 07/21/16 11:50 AM  Result Value Ref Range   WBC 6.3 4.0 - 10.5 K/uL   RBC 3.82 (L) 4.22 - 5.81 MIL/uL   Hemoglobin 11.1 (L) 13.0 - 17.0 g/dL   HCT 33.8 (L) 39.0 - 52.0 %   MCV 88.5 78.0 -  100.0 fL   MCH 29.1 26.0 - 34.0 pg   MCHC 32.8 30.0 - 36.0 g/dL   RDW 14.0 11.5 - 15.5 %   Platelets 170 150 - 400 K/uL   Neutrophils Relative % 88 %   Neutro Abs 5.6 1.7 - 7.7 K/uL   Lymphocytes Relative 8 %   Lymphs Abs 0.5 (L) 0.7 - 4.0 K/uL   Monocytes Relative 4 %   Monocytes Absolute 0.2  0.1 - 1.0 K/uL   Eosinophils Relative 0 %   Eosinophils Absolute 0.0 0.0 - 0.7 K/uL   Basophils Relative 0 %   Basophils Absolute 0.0 0.0 - 0.1 K/uL  Comprehensive metabolic panel     Status: Abnormal   Collection Time: 07/21/16 11:50 AM  Result Value Ref Range   Sodium 141 135 - 145 mmol/L   Potassium 3.7 3.5 - 5.1 mmol/L   Chloride 105 101 - 111 mmol/L   CO2 26 22 - 32 mmol/L   Glucose, Bld 132 (H) 65 - 99 mg/dL   BUN 30 (H) 6 - 20 mg/dL   Creatinine, Ser 1.28 (H) 0.61 - 1.24 mg/dL   Calcium 9.6 8.9 - 10.3 mg/dL   Total Protein 7.8 6.5 - 8.1 g/dL   Albumin 4.5 3.5 - 5.0 g/dL   AST 19 15 - 41 U/L   ALT 11 (L) 17 - 63 U/L   Alkaline Phosphatase 71 38 - 126 U/L   Total Bilirubin 0.6 0.3 - 1.2 mg/dL   GFR calc non Af Amer 50 (L) >60 mL/min   GFR calc Af Amer 58 (L) >60 mL/min    Comment: (NOTE) The eGFR has been calculated using the CKD EPI equation. This calculation has not been validated in all clinical situations. eGFR's persistently <60 mL/min signify possible Chronic Kidney Disease.    Anion gap 10 5 - 15  Type and screen [place]     Status: None   Collection Time: 07/21/16 11:50 AM  Result Value Ref Range   ABO/RH(D) B POS    Antibody Screen NEG    Sample Expiration 07/24/2016   ABO/Rh     Status: None   Collection Time: 07/21/16 11:50 AM  Result Value Ref Range   ABO/RH(D) B POS   POC occult blood, ED     Status: None   Collection Time: 07/21/16 12:06 PM  Result Value Ref Range   Fecal Occult Bld NEGATIVE NEGATIVE  Urinalysis, Routine w reflex microscopic     Status: Abnormal   Collection Time: 07/21/16  2:05 PM  Result Value Ref Range   Color, Urine YELLOW YELLOW   APPearance CLEAR CLEAR   Specific Gravity, Urine 1.042 (H) 1.005 - 1.030   pH 5.0 5.0 - 8.0   Glucose, UA NEGATIVE NEGATIVE mg/dL   Hgb urine dipstick NEGATIVE NEGATIVE   Bilirubin Urine NEGATIVE NEGATIVE   Ketones, ur NEGATIVE NEGATIVE mg/dL   Protein, ur 30 (A) NEGATIVE  mg/dL   Nitrite NEGATIVE NEGATIVE   Leukocytes, UA NEGATIVE NEGATIVE   RBC / HPF 0-5 0 - 5 RBC/hpf   WBC, UA 0-5 0 - 5 WBC/hpf   Bacteria, UA NONE SEEN NONE SEEN   Squamous Epithelial / LPF 6-30 (A) NONE SEEN   Mucous PRESENT    Hyaline Casts, UA PRESENT   I-Stat CG4 Lactic Acid, ED     Status: None   Collection Time: 07/21/16  4:05 PM  Result Value Ref Range   Lactic Acid, Venous 0.84 0.5 -  1.9 mmol/L   US Scrotum  Result Date: 07/21/2016 CLINICAL DATA:  Right testicular pain for 2 weeks. EXAM: SCROTAL ULTRASOUND DOPPLER ULTRASOUND OF THE TESTICLES TECHNIQUE: Complete ultrasound examination of the testicles, epididymis, and other scrotal structures was performed. Color and spectral Doppler ultrasound were also utilized to evaluate blood flow to the testicles. COMPARISON:  None. FINDINGS: Right testicle Measurements: 3.5 x 1.8 x 2.1 cm. No mass or microlithiasis visualized. Striated bilateral testicles, right greater than left, without a focal mass likely reflecting prior orchitis for fibrosis. Left testicle Measurements: 3.5 x 1.9 x 2.4 cm. No mass or microlithiasis visualized. Striated bilateral testicles, right greater than left, without a focal mass likely reflecting prior orchitis for fibrosis Right epididymis: 2 cystic structures in the epididymal tail measuring 3 and 4 mm respectively likely reflecting epididymal cysts. Otherwise normal in size and appearance. Left epididymis:  Normal in size and appearance. Hydrocele:  Large left hydrocele.  Moderate right hydrocele. Varicocele:  None visualized. Pulsed Doppler interrogation of both testes demonstrates normal low resistance arterial and venous waveforms bilaterally. IMPRESSION: 1. No testicular torsion. 2. Electronically Signed   By: Kathreen Devoid   On: 07/21/2016 14:06   Ct Abdomen Pelvis W Contrast  Result Date: 07/21/2016 CLINICAL DATA:  Her abdominal pain, dark stool for 1 week, vomiting this morning EXAM: CT ABDOMEN AND PELVIS WITH  CONTRAST TECHNIQUE: Multidetector CT imaging of the abdomen and pelvis was performed using the standard protocol following bolus administration of intravenous contrast. CONTRAST:  186m ISOVUE-300 IOPAMIDOL (ISOVUE-300) INJECTION 61% COMPARISON:  CT report 12/27/ 2000 no images available and scrotal ultrasound 07/21/2016 FINDINGS: Lower chest: Lung bases shows mild atelectasis or scarring left base anterolaterally. Hepatobiliary: There are indeterminate scattered low-density lesions within liver the largest in axial image 15 anterior aspect of left hepatic lobe consistent with a cyst. Given history of prostate cancer follow-up nonemergent enhanced MRI is recommended for further characterization. Pancreas: Enhanced pancreas with normal appearance without focal abnormality. Spleen: Normal appearance without focal abnormality. Adrenals/Urinary Tract: No adrenal gland mass. Enhanced kidneys are symmetrical in size. No hydronephrosis or hydroureter. Delayed renal images shows bilateral renal symmetrical excretion. Bilateral visualized proximal ureter is unremarkable. The patient is status post post prostatectomy. Bilateral distal ureter is unremarkable. The urinary bladder is under distended. Stomach/Bowel: There is distension of the stomach with fluid and some air-fluid level. Significant small bowel distension with multiple air-fluid levels. There is a left inguinal scrotal canal hernia measures 6 by 7.5 cm containing moderate distended small bowel lobe. The exiting loop from the hernia axial image 74 is small caliber. Findings are consistent with small bowel obstruction due to incarcerated left inguinal hernia. Small to moderate fluid/ hydrocele noted within lower aspect of the left inguinal scrotal hernia. There is a small right hydrocele. Postsurgical changes are noted within right colon/cecum. Moderate stool noted within right colon transverse colon descending colon and rectosigmoid colon. No evidence of distal  colonic obstruction. Vascular/Lymphatic: Atherosclerotic mild atherosclerotic calcifications of abdominal aorta and iliac arteries. No aortic aneurysm. No adenopathy. Reproductive: The patient is status post post proctectomy. Bilateral distal ureter is unremarkable. No pelvic mass. Other: No ascites or free abdominal air. Musculoskeletal: Sagittal images of the spine shows no destructive bony lesions. Degenerative changes are noted lumbar spine. Degenerative changes bilateral SI joints and pubic symphysis. No destructive bony lesions are noted within pelvis. IMPRESSION: 1. There is distension of the stomach with fluid and some air-fluid level. Significant small bowel distension with multiple air-fluid levels. There  is a left inguinal scrotal canal hernia measures 6 by 7.5 cm containing moderate distended small bowel lobe. The exiting loop from the hernia axial image 74 is small caliber. Findings are consistent with small bowel obstruction due to incarcerated left inguinal hernia. 2. There are indeterminate scattered low-density lesions within liver. The largest in left hepatic lobe anteriorly measures 1.4 cm consistent with a cyst. Given history of prostate cancer follow-up examination nonemergent enhanced MRI is recommended for further evaluation. 3. No hydronephrosis or hydroureter. 4. Moderate fluid/hydrocele noted in inferior aspect of left inguinal scrotal canal hernia. Small left hydrocele noted in right lower scrotal canal. 5. Moderate stool noted within colon. Postsurgical changes are noted within cecum. 6. Degenerative changes lumbar spine SI joints and pubic symphysis. No destructive bony lesions are noted. These results were called by telephone at the time of interpretation on 07/21/2016 at 3:12 pm to Dr. Caryl Ada , who verbally acknowledged these results. Electronically Signed   By: Lahoma Crocker M.D.   On: 07/21/2016 15:13   [place] Art/ven Flow Abd Pelv Doppler  Result Date: 07/21/2016 CLINICAL DATA:   Right testicular pain for 2 weeks. EXAM: SCROTAL ULTRASOUND DOPPLER ULTRASOUND OF THE TESTICLES TECHNIQUE: Complete ultrasound examination of the testicles, epididymis, and other scrotal structures was performed. Color and spectral Doppler ultrasound were also utilized to evaluate blood flow to the testicles. COMPARISON:  None. FINDINGS: Right testicle Measurements: 3.5 x 1.8 x 2.1 cm. No mass or microlithiasis visualized. Striated bilateral testicles, right greater than left, without a focal mass likely reflecting prior orchitis for fibrosis. Left testicle Measurements: 3.5 x 1.9 x 2.4 cm. No mass or microlithiasis visualized. Striated bilateral testicles, right greater than left, without a focal mass likely reflecting prior orchitis for fibrosis Right epididymis: 2 cystic structures in the epididymal tail measuring 3 and 4 mm respectively likely reflecting epididymal cysts. Otherwise normal in size and appearance. Left epididymis:  Normal in size and appearance. Hydrocele:  Large left hydrocele.  Moderate right hydrocele. Varicocele:  None visualized. Pulsed Doppler interrogation of both testes demonstrates normal low resistance arterial and venous waveforms bilaterally. IMPRESSION: 1. No testicular torsion. 2. Electronically Signed   By: Kathreen Devoid   On: 07/21/2016 14:06   Assessment/Plan  Small bowel obstruction with incarcerated/partially reducible inguinal hernia - Abdomen distended, nontender with partially reducible left inguinal hernia - Afebrile  - Lactic acid: 0.84 - WBC 6.3 -  CT abdomen/pelvis: Distension of stomach and small bowel with air fluid levels. Left inguinal scrotal canal hernia containing small bowel lobe.  FEN: NPO, IVF ID: none VTE: SCDs and lovenox  Plan:   1. Admit to inpatient unit 2. Place patient on NPO with NG tube hooked to suction. Keep head of bed up. 3. Will repeat labs in am and continue to monitor patient for increasing pain and changes in VS  4. Plan on  surgery tomorrow after bowel rest and NG decompression if patient status improves  Dorene Sorrow, PA-S2 [facility] Surgery 07/21/2016, 4:47 PM Pager: [phone] Consults: [phone] Mon-Fri 7:00 am-4:30 pm Sat-Sun 7:00 am-11:30 am

## 2016-07-21 NOTE — Progress Notes (Addendum)
Callback from Dr. Dwain SarnaWakefield, advised of situation. States to re-insert NG tube by GI nurse and even if the xray shows that tube placement is short like previous xray did today, to leave in place and not advance the tube, until pt can have done by radiology tomorrow. Will continue to monitor.

## 2016-07-21 NOTE — ED Notes (Signed)
Attempted to place NGT in rt nare multiple times.  Per surgery, advance tube based on xray.  When advancing, resistance met and with pt vomiting, tube coiling back to mouth. Stopped removed tube and attempted via second nurse with same result.  Gastric contents returning in tube each time.  Tube advances to what seems junction of stomach with no difficulty.  Unable to advance further into stomach with each attempt.  Notified PA. Requesting 5 [place] RN to attempt and if unsuccessful per Psychologist, sport and exercise, place under xray.

## 2016-07-21 NOTE — Progress Notes (Signed)
Diagnostic xray came back after day shift nurse placed NG tube again. Xray states NG tip in stomach, side port at GE junction, advance tube 6-7cm. Attempt to advance tube per xray report, resistance met, unable to advance and pt began vomiting. NG tube coiled to back of mouth. Tube was removed. Pt comforted and MD CCS service on call paged. Awaiting callback due to previous note states to possibly place under xray since pt has had difficulty with placement with several attempts.

## 2016-07-21 NOTE — ED Provider Notes (Signed)
[facility]-EMERGENCY DEPT Provider Note   CSN: [id number] Arrival date & time: 07/21/16  1036     History   Chief Complaint Chief Complaint  Patient presents with  . Rectal Bleeding    HPI Kevin Roman is a 83 y.o. male.  The history is provided by the patient. No language interpreter was used.  Rectal Bleeding  Quality:  Black and tarry Amount:  Moderate Duration:  1 day Timing:  Constant Chronicity:  New Context: diarrhea   Relieved by:  Nothing Worsened by:  Nothing Associated symptoms: abdominal pain and vomiting   Pt had diarrhea brown  Heme negative  Past Medical History:  Diagnosis Date  . Hypertension   . Parkinson's disease     Patient Active Problem List   Diagnosis Date Noted  . Acute renal failure (HCC) 11/28/2011  . Palpitations 11/26/2011  . HTN (hypertension) 11/26/2011  . Parkinson's disease 11/26/2011  . Hypercholesteremia 11/26/2011  . Anemia 11/26/2011    Past Surgical History:  Procedure Laterality Date  . PROSTATE SURGERY         Home Medications    Prior to Admission medications   Medication Sig Start Date End Date Taking? Authorizing Provider  aspirin EC 81 MG tablet Take 81 mg by mouth daily.    Historical Provider, MD  carbidopa-levodopa (SINEMET IR) 25-100 MG per tablet Take 2 tablets by mouth 3 (three) times daily.    Historical Provider, MD  cloNIDine (CATAPRES) 0.1 MG tablet Take 0.1 mg by mouth daily as needed. For blood pressure    Historical Provider, MD  furosemide (LASIX) 20 MG tablet Take 1 tablet (20 mg total) by mouth daily. 01/03/15   Geoffery Lyons, MD  hydroxypropyl methylcellulose (ISOPTO TEARS) 2.5 % ophthalmic solution Place 1 drop into both eyes 3 (three) times daily as needed. For dry eyes    Historical Provider, MD  metoprolol (LOPRESSOR) 50 MG tablet Take 25 mg by mouth 2 (two) times daily.    Historical Provider, MD  rasagiline (AZILECT) 0.5 MG TABS Take 1 mg by mouth daily.    Historical Provider, MD    simvastatin (ZOCOR) 80 MG tablet Take 40 mg by mouth at bedtime.    Historical Provider, MD    Family History History reviewed. No pertinent family history.  Social History Social History  Substance Use Topics  . Smoking status: Former Games developer  . Smokeless tobacco: Not on file  . Alcohol use No     Allergies   Patient has no known allergies.   Review of Systems Review of Systems  Gastrointestinal: Positive for abdominal pain, diarrhea, hematochezia, nausea and vomiting.  Genitourinary: Positive for scrotal swelling.  All other systems reviewed and are negative.    Physical Exam Updated Vital Signs BP (!) 150/52   Pulse 80   Temp 98.5 F (36.9 C) (Oral)   Resp 16   SpO2 94%   Physical Exam  Constitutional: He appears well-developed and well-nourished.  HENT:  Head: Normocephalic and atraumatic.  Nose: Nose normal.  Mouth/Throat: Oropharynx is clear and moist.  Eyes: Conjunctivae are normal.  Neck: Neck supple.  Cardiovascular: Normal rate and regular rhythm.   No murmur heard. Pulmonary/Chest: Effort normal and breath sounds normal. No respiratory distress.  Abdominal: Soft. There is no tenderness.  Genitourinary:  Genitourinary Comments: Large scrotal mass,   Musculoskeletal: He exhibits no edema.  Neurological: He is alert.  Skin: Skin is warm and dry.  Psychiatric: He has a normal mood and affect.  Nursing note and vitals reviewed.  Pt vomitted x 2 during exam. Diarrhea x 1   ED Treatments / Results  Labs (all labs ordered are listed, but only abnormal results are displayed) Labs Reviewed  CBC WITH DIFFERENTIAL/PLATELET - Abnormal; Notable for the following:       Result Value   RBC 3.82 (*)    Hemoglobin 11.1 (*)    HCT 33.8 (*)    Lymphs Abs 0.5 (*)    All other components within normal limits  COMPREHENSIVE METABOLIC PANEL - Abnormal; Notable for the following:    Glucose, Bld 132 (*)    BUN 30 (*)    Creatinine, Ser 1.28 (*)    ALT 11  (*)    GFR calc non Af Amer 50 (*)    GFR calc Af Amer 58 (*)    All other components within normal limits  URINALYSIS, ROUTINE W REFLEX MICROSCOPIC - Abnormal; Notable for the following:    Specific Gravity, Urine 1.042 (*)    Protein, ur 30 (*)    Squamous Epithelial / LPF 6-30 (*)    All other components within normal limits  POC OCCULT BLOOD, ED  TYPE AND SCREEN  ABO/RH    EKG  EKG Interpretation None       Radiology US Scrotum  Result Date: 07/21/2016 CLINICAL DATA:  Right testicular pain for 2 weeks. EXAM: SCROTAL ULTRASOUND DOPPLER ULTRASOUND OF THE TESTICLES TECHNIQUE: Complete ultrasound examination of the testicles, epididymis, and other scrotal structures was performed. Color and spectral Doppler ultrasound were also utilized to evaluate blood flow to the testicles. COMPARISON:  None. FINDINGS: Right testicle Measurements: 3.5 x 1.8 x 2.1 cm. No mass or microlithiasis visualized. Striated bilateral testicles, right greater than left, without a focal mass likely reflecting prior orchitis for fibrosis. Left testicle Measurements: 3.5 x 1.9 x 2.4 cm. No mass or microlithiasis visualized. Striated bilateral testicles, right greater than left, without a focal mass likely reflecting prior orchitis for fibrosis Right epididymis: 2 cystic structures in the epididymal tail measuring 3 and 4 mm respectively likely reflecting epididymal cysts. Otherwise normal in size and appearance. Left epididymis:  Normal in size and appearance. Hydrocele:  Large left hydrocele.  Moderate right hydrocele. Varicocele:  None visualized. Pulsed Doppler interrogation of both testes demonstrates normal low resistance arterial and venous waveforms bilaterally. IMPRESSION: 1. No testicular torsion. 2. Electronically Signed   By: Elige Ko   On: 07/21/2016 14:06   Ct Abdomen Pelvis W Contrast  Result Date: 07/21/2016 CLINICAL DATA:  Her abdominal pain, dark stool for 1 week, vomiting this morning EXAM: CT  ABDOMEN AND PELVIS WITH CONTRAST TECHNIQUE: Multidetector CT imaging of the abdomen and pelvis was performed using the standard protocol following bolus administration of intravenous contrast. CONTRAST:  ISOVUE-300 IOPAMIDOL (ISOVUE-300) INJECTION 61% COMPARISON:  CT report 12/27/ 2000 no images available and scrotal ultrasound 07/21/2016 FINDINGS: Lower chest: Lung bases shows mild atelectasis or scarring left base anterolaterally. Hepatobiliary: There are indeterminate scattered low-density lesions within liver the largest in axial image 15 anterior aspect of left hepatic lobe consistent with a cyst. Given history of prostate cancer follow-up nonemergent enhanced MRI is recommended for further characterization. Pancreas: Enhanced pancreas with normal appearance without focal abnormality. Spleen: Normal appearance without focal abnormality. Adrenals/Urinary Tract: No adrenal gland mass. Enhanced kidneys are symmetrical in size. No hydronephrosis or hydroureter. Delayed renal images shows bilateral renal symmetrical excretion. Bilateral visualized proximal ureter is unremarkable. The patient is status post post prostatectomy.  Bilateral distal ureter is unremarkable. The urinary bladder is under distended. Stomach/Bowel: There is distension of the stomach with fluid and some air-fluid level. Significant small bowel distension with multiple air-fluid levels. There is a left inguinal scrotal canal hernia measures 6 by 7.5 cm containing moderate distended small bowel lobe. The exiting loop from the hernia axial image 74 is small caliber. Findings are consistent with small bowel obstruction due to incarcerated left inguinal hernia. Small to moderate fluid/ hydrocele noted within lower aspect of the left inguinal scrotal hernia. There is a small right hydrocele. Postsurgical changes are noted within right colon/cecum. Moderate stool noted within right colon transverse colon descending colon and rectosigmoid colon.  No evidence of distal colonic obstruction. Vascular/Lymphatic: Atherosclerotic mild atherosclerotic calcifications of abdominal aorta and iliac arteries. No aortic aneurysm. No adenopathy. Reproductive: The patient is status post post proctectomy. Bilateral distal ureter is unremarkable. No pelvic mass. Other: No ascites or free abdominal air. Musculoskeletal: Sagittal images of the spine shows no destructive bony lesions. Degenerative changes are noted lumbar spine. Degenerative changes bilateral SI joints and pubic symphysis. No destructive bony lesions are noted within pelvis. IMPRESSION: 1. There is distension of the stomach with fluid and some air-fluid level. Significant small bowel distension with multiple air-fluid levels. There is a left inguinal scrotal canal hernia measures 6 by 7.5 cm containing moderate distended small bowel lobe. The exiting loop from the hernia axial image 74 is small caliber. Findings are consistent with small bowel obstruction due to incarcerated left inguinal hernia. 2. There are indeterminate scattered low-density lesions within liver. The largest in left hepatic lobe anteriorly measures 1.4 cm consistent with a cyst. Given history of prostate cancer follow-up examination nonemergent enhanced MRI is recommended for further evaluation. 3. No hydronephrosis or hydroureter. 4. Moderate fluid/hydrocele noted in inferior aspect of left inguinal scrotal canal hernia. Small left hydrocele noted in right lower scrotal canal. 5. Moderate stool noted within colon. Postsurgical changes are noted within cecum. 6. Degenerative changes lumbar spine SI joints and pubic symphysis. No destructive bony lesions are noted. These results were called by telephone at the time of interpretation on 07/21/2016 at 3:12 pm to Dr. Cheron SchaumannLESLIE Shanaia Sievers , who verbally acknowledged these results. Electronically Signed   By: Natasha MeadLiviu  Pop M.D.   On: 07/21/2016 15:13   [place] Art/ven Flow Abd Pelv Doppler  Result Date:  07/21/2016 CLINICAL DATA:  Right testicular pain for 2 weeks. EXAM: SCROTAL ULTRASOUND DOPPLER ULTRASOUND OF THE TESTICLES TECHNIQUE: Complete ultrasound examination of the testicles, epididymis, and other scrotal structures was performed. Color and spectral Doppler ultrasound were also utilized to evaluate blood flow to the testicles. COMPARISON:  None. FINDINGS: Right testicle Measurements: 3.5 x 1.8 x 2.1 cm. No mass or microlithiasis visualized. Striated bilateral testicles, right greater than left, without a focal mass likely reflecting prior orchitis for fibrosis. Left testicle Measurements: 3.5 x 1.9 x 2.4 cm. No mass or microlithiasis visualized. Striated bilateral testicles, right greater than left, without a focal mass likely reflecting prior orchitis for fibrosis Right epididymis: 2 cystic structures in the epididymal tail measuring 3 and 4 mm respectively likely reflecting epididymal cysts. Otherwise normal in size and appearance. Left epididymis:  Normal in size and appearance. Hydrocele:  Large left hydrocele.  Moderate right hydrocele. Varicocele:  None visualized. Pulsed Doppler interrogation of both testes demonstrates normal low resistance arterial and venous waveforms bilaterally. IMPRESSION: 1. No testicular torsion. 2. Electronically Signed   By: Elige KoHetal  Patel   On: 07/21/2016 14:06  Procedures Procedures (including critical care time)  Medications Ordered in ED Medications  pantoprazole (PROTONIX) injection 40 mg (40 mg Intravenous Given 07/21/16 1149)  ondansetron (ZOFRAN) injection 4 mg (4 mg Intravenous Given 07/21/16 1149)  iopamidol (ISOVUE-300) 61 % injection 100 mL (100 mLs Intravenous Contrast Given 07/21/16 1343)   No evidence of gi bleed,  Ct scan shows compete small bowel obstruction and incarcerated hernia.  Pt also has liver lesions.  I spoke with Dr. Dwain Sarna surgeon who will see here.  He advised ng tube, hospitalist admission.   Hospitalist consult  Initial  Impression / Assessment and Plan / ED Course  I have reviewed the triage vital signs and the nursing notes.  Pertinent labs & imaging results that were available during my care of the patient were reviewed by me and considered in my medical decision making (see chart for details).      Final Clinical Impressions(s) / ED Diagnoses   Final diagnoses:  Small bowel obstruction  Incarcerated hernia    New Prescriptions New Prescriptions   No medications on file    Pt and family counseled on findings.  Hospitalist Dr. Ella Jubilee will see to admit.   Lonia Skinner [place], PA-C 07/21/16 1540    [address] [place], [place] 07/21/16 1548    Canary Brim Tegeler, MD 07/22/16 1009

## 2016-07-22 ENCOUNTER — Inpatient Hospital Stay (HOSPITAL_COMMUNITY): Payer: Medicare Other

## 2016-07-22 ENCOUNTER — Encounter (HOSPITAL_COMMUNITY): Payer: Self-pay

## 2016-07-22 LAB — COMPREHENSIVE METABOLIC PANEL
ALK PHOS: 51 U/L (ref 38–126)
ALT: 22 U/L (ref 17–63)
ANION GAP: 11 (ref 5–15)
AST: 24 U/L (ref 15–41)
Albumin: 3.8 g/dL (ref 3.5–5.0)
BILIRUBIN TOTAL: 0.8 mg/dL (ref 0.3–1.2)
BUN: 40 mg/dL — ABNORMAL HIGH (ref 6–20)
CALCIUM: 9 mg/dL (ref 8.9–10.3)
CO2: 23 mmol/L (ref 22–32)
Chloride: 108 mmol/L (ref 101–111)
Creatinine, Ser: 1.55 mg/dL — ABNORMAL HIGH (ref 0.61–1.24)
GFR calc non Af Amer: 39 mL/min — ABNORMAL LOW (ref >60)
GFR, EST AFRICAN AMERICAN: 46 mL/min — AB (ref >60)
Glucose, Bld: 157 mg/dL — ABNORMAL HIGH (ref 65–99)
POTASSIUM: 4.2 mmol/L (ref 3.5–5.1)
SODIUM: 142 mmol/L (ref 135–145)
TOTAL PROTEIN: 6.8 g/dL (ref 6.5–8.1)

## 2016-07-22 LAB — CBC
HCT: 30.7 % — ABNORMAL LOW (ref 39.0–52.0)
HEMOGLOBIN: 10.2 g/dL — AB (ref 13.0–17.0)
MCH: 29.5 pg (ref 26.0–34.0)
MCHC: 33.2 g/dL (ref 30.0–36.0)
MCV: 88.7 fL (ref 78.0–100.0)
Platelets: 123 10*3/uL — ABNORMAL LOW (ref 150–400)
RBC: 3.46 MIL/uL — AB (ref 4.22–5.81)
RDW: 14.2 % (ref 11.5–15.5)
WBC: 12.3 10*3/uL — ABNORMAL HIGH (ref 4.0–10.5)

## 2016-07-22 LAB — GLUCOSE, CAPILLARY: Glucose-Capillary: 101 mg/dL — ABNORMAL HIGH (ref 65–99)

## 2016-07-22 MED ORDER — IOPAMIDOL (ISOVUE-300) INJECTION 61%
INTRAVENOUS | Status: AC
  Filled 2016-07-22: qty 50

## 2016-07-22 MED ORDER — BUTAMBEN-TETRACAINE-BENZOCAINE 2-2-14 % EX AERO
INHALATION_SPRAY | CUTANEOUS | Status: AC
  Filled 2016-07-22: qty 20

## 2016-07-22 MED ORDER — INFLUENZA VAC SPLIT QUAD 0.5 ML IM SUSY: 0.5000 mL | PREFILLED_SYRINGE | INTRAMUSCULAR | Status: DC

## 2016-07-22 MED ORDER — LIDOCAINE HCL 2 % EX GEL
CUTANEOUS | Status: AC
  Filled 2016-07-22: qty 30

## 2016-07-22 MED ORDER — PIPERACILLIN-TAZOBACTAM 3.375 G IVPB
3.3750 g | Freq: Three times a day (TID) | INTRAVENOUS | Status: DC
  Administered 2016-07-22 – 2016-07-26 (×11): 3.375 g via INTRAVENOUS
  Filled 2016-07-22 (×12): qty 50

## 2016-07-22 NOTE — Progress Notes (Signed)
PROGRESS NOTE    LOUDON KRAKOW  ZOX:[id number] DOB: 01-20-1930 DOA: 07/21/2016 PCP: No PCP Per Patient    Brief Narrative:Kevin Roman is a 83 y.o. male with medical history significant of parkinson's disease who presents with severe and persistent nausea and vomiting. CT abdomen and pelvis shows There is distension of the stomach with fluid and some air-fluid level. Significant small bowel distension with multiple air-fluid levels. There is a left inguinal scrotal canal hernia measures 6 by 7.5 cm containing moderate distended small bowel lobe. The exiting loop from the hernia axial image 74 is small caliber. Findings are consistent with small bowel obstruction due to incarcerated left inguinal hernia.2 There are indeterminate scattered low-density lesions within liver. The largest in left hepatic lobe anteriorly measures 1.4 cm consistent with a cyst. Given history of prostate cancer follow-up examination nonemergent enhanced MRI is recommended for further evaluation. Surgery consulted and recommendations given. He underwent NG Tube placement.   Assessment & Plan:   Active Problems:   SBO (small bowel obstruction)   Incarcerated hernia   SBO from incarcerated inguinal hernia; NT tube in, SURGERY plans to hernia repair.  Pain control.    Leukocytosis, febrile, blood cultures done. Would put him on IV zosyn emirically.  Monitor.  Anemia stable hemoglobin at 10.2  CKD stage 3: baseline creatinine between 1.3 to 1.5.  Currently at 1.55. Monitor.   Agitation, trying pull the NG tube out. He is in restraints.      DVT prophylaxis: (Lovenox) Code Status: (Full/) Family Communication:none at bedside.  Disposition Plan:  Pending further evaluation.   Consultants:   Surgery.    Procedures: none.    Antimicrobials: IV zosyn.   Subjective: Denies any new complaints.   Objective: Vitals:   07/21/16 1630 07/21/16 1856 07/21/16 2053 07/22/16 0501  BP: 163/89 (!) 153/70  (!) 169/73 (!) 144/58  Pulse: 81 88 94 86  Resp:  18 18 18   Temp:  98.2 F (36.8 C) 99.4 F (37.4 C) (!) 101.3 F (38.5 C)  TempSrc:  Oral Oral Oral  SpO2: 99% 95% 95% 96%    Intake/Output Summary (Last 24 hours) at 07/22/16 0947 Last data filed at 07/22/16 0556  Gross per 24 hour  Intake              605 ml  Output             1200 ml  Net             -595 ml   There were no vitals filed for this visit.  Examination:  General exam: Appears calm and comfortable NG tube in place.  Respiratory system: Clear to auscultation. Respiratory effort normal. Cardiovascular system: S1 & S2 heard, RRR. No JVD, murmurs, rubs, gallops or clicks. No pedal edema. Gastrointestinal system: Abdomen is nondistended, soft non tender.  Central nervous system: Alert ,but confused.  Extremities: Symmetric 5 x 5 power. Skin: No rashes, lesions or ulcers    Data Reviewed: I have personally reviewed following labs and imaging studies  CBC:  Recent Labs Lab 07/21/16 1150 07/22/16 0640  WBC 6.3 12.3*  NEUTROABS 5.6  --   HGB 11.1* 10.2*  HCT 33.8* 30.7*  MCV 88.5 88.7  PLT 170 123*   Basic Metabolic Panel:  Recent Labs Lab 07/21/16 1150 07/22/16 0640  NA 141 142  K 3.7 4.2  CL 105 108  CO2 26 23  GLUCOSE 132* 157*  BUN 30* 40*  CREATININE 1.28* 1.55*  CALCIUM 9.6 9.0   GFR: CrCl cannot be calculated (Unknown ideal weight.). Liver Function Tests:  Recent Labs Lab 07/21/16 1150 07/22/16 0640  AST 19 24  ALT 11* 22  ALKPHOS 71 51  BILITOT 0.6 0.8  PROT 7.8 6.8  ALBUMIN 4.5 3.8   No results for input(s): LIPASE, AMYLASE in the last 168 hours. No results for input(s): AMMONIA in the last 168 hours. Coagulation Profile: No results for input(s): INR, PROTIME in the last 168 hours. Cardiac Enzymes: No results for input(s): CKTOTAL, CKMB, CKMBINDEX, TROPONINI in the last 168 hours. BNP (last 3 results) No results for input(s): PROBNP in the last 8760 hours. HbA1C: No  results for input(s): HGBA1C in the last 72 hours. CBG: No results for input(s): GLUCAP in the last 168 hours. Lipid Profile: No results for input(s): CHOL, HDL, LDLCALC, TRIG, CHOLHDL, LDLDIRECT in the last 72 hours. Thyroid Function Tests: No results for input(s): TSH, T4TOTAL, FREET4, T3FREE, THYROIDAB in the last 72 hours. Anemia Panel: No results for input(s): VITAMINB12, FOLATE, FERRITIN, TIBC, IRON, RETICCTPCT in the last 72 hours. Sepsis Labs:  Recent Labs Lab 07/21/16 1605  LATICACIDVEN 0.84    No results found for this or any previous visit (from the past 240 hour(s)).       Radiology Studies: Dg Abd 1 View  Result Date: 07/22/2016 CLINICAL DATA:  83 year old male.  Evaluate for NG tube positioning. EXAM: ABDOMEN - 1 VIEW COMPARISON:  Abdominal radiograph dated 07/22/2016 FINDINGS: An enteric tube is visualized, relatively stable positioning. The side port of the enteric tube appears to be above the gastroesophageal junction and the tip of the tube is likely within the proximal stomach. Recommend advancing the tube into the stomach. There are bibasilar atelectatic changes versus infiltrate. IMPRESSION: Enteric tube in similar position as the prior radiograph with side-port likely above the GE junction. Recommend advancing into the stomach. Electronically Signed   By: Elgie Collard M.D.   On: 07/22/2016 06:18   US Scrotum  Result Date: 07/21/2016 CLINICAL DATA:  Right testicular pain for 2 weeks. EXAM: SCROTAL ULTRASOUND DOPPLER ULTRASOUND OF THE TESTICLES TECHNIQUE: Complete ultrasound examination of the testicles, epididymis, and other scrotal structures was performed. Color and spectral Doppler ultrasound were also utilized to evaluate blood flow to the testicles. COMPARISON:  None. FINDINGS: Right testicle Measurements: 3.5 x 1.8 x 2.1 cm. No mass or microlithiasis visualized. Striated bilateral testicles, right greater than left, without a focal mass likely reflecting  prior orchitis for fibrosis. Left testicle Measurements: 3.5 x 1.9 x 2.4 cm. No mass or microlithiasis visualized. Striated bilateral testicles, right greater than left, without a focal mass likely reflecting prior orchitis for fibrosis Right epididymis: 2 cystic structures in the epididymal tail measuring 3 and 4 mm respectively likely reflecting epididymal cysts. Otherwise normal in size and appearance. Left epididymis:  Normal in size and appearance. Hydrocele:  Large left hydrocele.  Moderate right hydrocele. Varicocele:  None visualized. Pulsed Doppler interrogation of both testes demonstrates normal low resistance arterial and venous waveforms bilaterally. IMPRESSION: 1. No testicular torsion. 2. Electronically Signed   By: Elige Ko   On: 07/21/2016 14:06   Ct Abdomen Pelvis W Contrast  Result Date: 07/21/2016 CLINICAL DATA:  Her abdominal pain, dark stool for 1 week, vomiting this morning EXAM: CT ABDOMEN AND PELVIS WITH CONTRAST TECHNIQUE: Multidetector CT imaging of the abdomen and pelvis was performed using the standard protocol following bolus administration of intravenous contrast. CONTRAST:  ISOVUE-300 IOPAMIDOL (ISOVUE-300) INJECTION 61%  COMPARISON:  CT report 12/27/ 2000 no images available and scrotal ultrasound 07/21/2016 FINDINGS: Lower chest: Lung bases shows mild atelectasis or scarring left base anterolaterally. Hepatobiliary: There are indeterminate scattered low-density lesions within liver the largest in axial image 15 anterior aspect of left hepatic lobe consistent with a cyst. Given history of prostate cancer follow-up nonemergent enhanced MRI is recommended for further characterization. Pancreas: Enhanced pancreas with normal appearance without focal abnormality. Spleen: Normal appearance without focal abnormality. Adrenals/Urinary Tract: No adrenal gland mass. Enhanced kidneys are symmetrical in size. No hydronephrosis or hydroureter. Delayed renal images shows bilateral  renal symmetrical excretion. Bilateral visualized proximal ureter is unremarkable. The patient is status post post prostatectomy. Bilateral distal ureter is unremarkable. The urinary bladder is under distended. Stomach/Bowel: There is distension of the stomach with fluid and some air-fluid level. Significant small bowel distension with multiple air-fluid levels. There is a left inguinal scrotal canal hernia measures 6 by 7.5 cm containing moderate distended small bowel lobe. The exiting loop from the hernia axial image 74 is small caliber. Findings are consistent with small bowel obstruction due to incarcerated left inguinal hernia. Small to moderate fluid/ hydrocele noted within lower aspect of the left inguinal scrotal hernia. There is a small right hydrocele. Postsurgical changes are noted within right colon/cecum. Moderate stool noted within right colon transverse colon descending colon and rectosigmoid colon. No evidence of distal colonic obstruction. Vascular/Lymphatic: Atherosclerotic mild atherosclerotic calcifications of abdominal aorta and iliac arteries. No aortic aneurysm. No adenopathy. Reproductive: The patient is status post post proctectomy. Bilateral distal ureter is unremarkable. No pelvic mass. Other: No ascites or free abdominal air. Musculoskeletal: Sagittal images of the spine shows no destructive bony lesions. Degenerative changes are noted lumbar spine. Degenerative changes bilateral SI joints and pubic symphysis. No destructive bony lesions are noted within pelvis. IMPRESSION: 1. There is distension of the stomach with fluid and some air-fluid level. Significant small bowel distension with multiple air-fluid levels. There is a left inguinal scrotal canal hernia measures 6 by 7.5 cm containing moderate distended small bowel lobe. The exiting loop from the hernia axial image 74 is small caliber. Findings are consistent with small bowel obstruction due to incarcerated left inguinal hernia. 2.  There are indeterminate scattered low-density lesions within liver. The largest in left hepatic lobe anteriorly measures 1.4 cm consistent with a cyst. Given history of prostate cancer follow-up examination nonemergent enhanced MRI is recommended for further evaluation. 3. No hydronephrosis or hydroureter. 4. Moderate fluid/hydrocele noted in inferior aspect of left inguinal scrotal canal hernia. Small left hydrocele noted in right lower scrotal canal. 5. Moderate stool noted within colon. Postsurgical changes are noted within cecum. 6. Degenerative changes lumbar spine SI joints and pubic symphysis. No destructive bony lesions are noted. These results were called by telephone at the time of interpretation on 07/21/2016 at 3:12 pm to Dr. Cheron SchaumannLESLIE SOFIA , who verbally acknowledged these results. Electronically Signed   By: Natasha MeadLiviu  Pop M.D.   On: 07/21/2016 15:13   [place] Art/ven Flow Abd Pelv Doppler  Result Date: 07/21/2016 CLINICAL DATA:  Right testicular pain for 2 weeks. EXAM: SCROTAL ULTRASOUND DOPPLER ULTRASOUND OF THE TESTICLES TECHNIQUE: Complete ultrasound examination of the testicles, epididymis, and other scrotal structures was performed. Color and spectral Doppler ultrasound were also utilized to evaluate blood flow to the testicles. COMPARISON:  None. FINDINGS: Right testicle Measurements: 3.5 x 1.8 x 2.1 cm. No mass or microlithiasis visualized. Striated bilateral testicles, right greater than left, without a focal mass  likely reflecting prior orchitis for fibrosis. Left testicle Measurements: 3.5 x 1.9 x 2.4 cm. No mass or microlithiasis visualized. Striated bilateral testicles, right greater than left, without a focal mass likely reflecting prior orchitis for fibrosis Right epididymis: 2 cystic structures in the epididymal tail measuring 3 and 4 mm respectively likely reflecting epididymal cysts. Otherwise normal in size and appearance. Left epididymis:  Normal in size and appearance. Hydrocele:  Large  left hydrocele.  Moderate right hydrocele. Varicocele:  None visualized. Pulsed Doppler interrogation of both testes demonstrates normal low resistance arterial and venous waveforms bilaterally. IMPRESSION: 1. No testicular torsion. 2. Electronically Signed   By: Elige Ko   On: 07/21/2016 14:06   Dg Abd Portable 1v  Result Date: 07/22/2016 CLINICAL DATA:  83 year old male status post NG tube placement. EXAM: PORTABLE ABDOMEN - 1 VIEW COMPARISON:  Earlier radiograph dated 07/21/2016 FINDINGS: An enteric tube is partially visualized with tip in the proximal stomach and side port above the GE junction as seen on the prior radiograph. Recommend advancing of the tube into the stomach. There is persistent dilatation of small-bowel loops measuring up to 4.2 cm in the mid abdomen. There is moderate colonic stool burden. Multiple surgical clips noted within the pelvis. No acute osseous pathology. IMPRESSION: Enteric tube with tip in the proximal stomach and side port above the GE junction similar to prior radiograph. Recommend advancing of the tube into the stomach. Persistent small bowel dilatation.  Follow-up recommended. Electronically Signed   By: Elgie Collard M.D.   On: 07/22/2016 03:25   Dg Abd Portable 1v  Result Date: 07/21/2016 CLINICAL DATA:  NG tube placement EXAM: PORTABLE ABDOMEN - 1 VIEW COMPARISON:  07/21/2016 FINDINGS: NG tube with tip in the stomach. Side port is abut the GE junction. Consider advancing by 6 to 7 cm. IMPRESSION: Feeding tube within the stomach. Side port above the GE junction as above. Electronically Signed   By: Genevive Bi M.D.   On: 07/21/2016 20:49   Dg Abd Portable 1v  Result Date: 07/21/2016 CLINICAL DATA:  Unsuccessful NG tube placement. EXAM: PORTABLE ABDOMEN - 1 VIEW COMPARISON:  None. FINDINGS: Nasogastric tube with the tip just above the esophagogastric junction. There is no bowel dilatation to suggest obstruction. There is no evidence of pneumoperitoneum,  portal venous gas or pneumatosis. There are no pathologic calcifications along the expected course of the ureters. The osseous structures are unremarkable. IMPRESSION: Nasogastric tube with the tip just above the esophagogastric junction. Recommend advancing the tube 15 cm. Electronically Signed   By: Elige Ko   On: 07/21/2016 17:44        Scheduled Meds: . calcium-vitamin D  2 tablet Oral Daily  . carbidopa-levodopa  2 tablet Oral TID  . enoxaparin (LOVENOX) injection  40 mg Subcutaneous QHS  . folic acid  1 mg Oral Daily  . metoprolol  25 mg Oral BID   Continuous Infusions: . dextrose 5 % and 0.9% NaCl 75 mL/hr at 07/21/16 2152     LOS: 1 day    Time spent: 30 minutes.     Kathlen Mody, MD [facility] Pager [phone]   If 7PM-7AM, please contact night-coverage [email or web address] Password [facility] 07/22/2016, 9:47 AM

## 2016-07-22 NOTE — Progress Notes (Signed)
Initial Nutrition Assessment  DOCUMENTATION CODES:   Not applicable  INTERVENTION:  RD will order supplements when diet advanced   NUTRITION DIAGNOSIS:   Inadequate oral intake related to lethargy/confusion, altered GI function as evidenced by NPO status, severe depletion of muscle mass.  GOAL:   Patient will meet greater than or equal to 90% of their needs  MONITOR:   Diet advancement, I & O's, Labs, Weight trends  REASON FOR ASSESSMENT:   Malnutrition Screening Tool    ASSESSMENT:   83 y.o. male with medical history significant of parkinson's disease, CKD III, who presents with severe and persistent nausea and vomiting. Diagnosed with incarcerated inguinal hernia with bowel obstruction   Met with pt's family in room today. Family reports poor PO and oral intake for 4 days pta. Pt has been vomiting with abdominal pain. Pt with multiple NGT placements. Pt keeps pulling out the tubes. Pt has restraints in place currently. There is no admit wt on this pt. RD weighed pt in bed today at 194lbs. Family reports pt weighed 185lbs at his appointment last week and that his weight is stable. Pt with NGT on suction and scheduled to have surgery today.   Medications reviewed and include: Ca-Vit D, lovenox, folic acid  Labs reviewed: BUN 40(H), creat 1.55(H) Wbc- 12.3(H)  Nutrition-Focused physical exam completed. Findings are no fat depletion, severe muscle depletion in temporal and clavicles, and moderate edema.   Diet Order:  Diet NPO time specified  Skin:  Reviewed, no issues  Last BM:  1/21  Height:   Ht Readings from Last 1 Encounters:  07/22/16 5' 8"  (1.727 m)    Weight:   Wt Readings from Last 1 Encounters:  07/22/16 185 lb 3 oz (84 kg)    Ideal Body Weight:  70 kg  BMI:  Body mass index is 28.16 kg/m.  Estimated Nutritional Needs:   Kcal:  1700-1900kcal/day   Protein:  101-117g/day   Fluid:  >1.7L/day   EDUCATION NEEDS:   No education needs  identified at this time  Koleen Distance, RD, LDN Pager #[phone]

## 2016-07-22 NOTE — Progress Notes (Signed)
Day shift nurse unable to complete admission because family left and pt having difficulty with NGT. Family not present this evening. Will pass to day shift to complete when family is present.

## 2016-07-22 NOTE — Progress Notes (Signed)
Pharmacy Antibiotic Note  Kevin Roman is a 83 y.o. male admitted on 07/21/2016 with intra-abdominal infection.  patient with incarcerated hernia. Pharmacy has been consulted for zosyn dosing. Given zosyn x 1 in ED 1/21 pm but not resumed as no signs of infection.  Now resuming for fever this am and mildly elevated WBC.  Today, 07/22/2016  SCr trending up since admission  WBC up from admission   Plan:  Zosyn 3.375gm IV q8h over 4h infusion   Watch renal function   Height: 5\' 8"  (172.7 cm) Weight: 185 lb 3 oz (84 kg) (per pt reports ) IBW/kg (Calculated) : 68.4  Temp (24hrs), Avg:99.6 F (37.6 C), Min:98.2 F (36.8 C), Max:101.3 F (38.5 C)   Recent Labs Lab 07/21/16 1150 07/21/16 1605 07/22/16 0640  WBC 6.3  --  12.3*  CREATININE 1.28*  --  1.55*  LATICACIDVEN  --  0.84  --     Estimated Creatinine Clearance: 37.4 mL/min (by C-G formula based on SCr of 1.55 mg/dL (H)).    No Known Allergies  Antimicrobials this admission: 1/22 >> zosyn   Dose adjustments this admission:   Microbiology results: 1/22 BCx:   Thank you for allowing pharmacy to be a part of this patient's care.  Kevin Roman, PharmD, BCPS.   Pager: [phone] 07/22/2016 5:32 PM

## 2016-07-22 NOTE — Progress Notes (Addendum)
7716fr NGT inserted via left nare. Verified via auscultation and awaiting Xray verification. NGT draining dark green/brown liquid. Pt tolerated well. Pt made comfortable in bed with HOB elevated. Will continue to monitor. Mittens placed on hands and encouraged pt to leave NGT in place. Checked to ensure tube secured to nose.

## 2016-07-22 NOTE — Progress Notes (Signed)
Xray report for verification of NGT placement states: Enteric tube in similar position as the prior radiograph with side-port likely above the GE junction. Recommend advancing into the stomach. Per previous conversation with Dr. Dwain SarnaWakefield with [facility], do not advance tube as long as tube is showing in stomach. Will continue to monitor.

## 2016-07-22 NOTE — Progress Notes (Signed)
Subjective: NG not working, advanced and we got gastric fluid back, but it is coiled in back of throat.  Will ask Radiology to replace.  Objective: Vital signs in last 24 hours: Temp:  [98.2 F (36.8 C)-101.3 F (38.5 C)] 101.3 F (38.5 C) (01/22 0501) Pulse Rate:  [72-94] 86 (01/22 0501) Resp:  [16-18] 18 (01/22 0501) BP: (144-177)/(52-89) 144/58 (01/22 0501) SpO2:  [94 %-100 %] 96 % (01/22 0501) Last BM Date: 07/21/16 NPO 600 IV 1200 per NG yesterday Temp 101.3 at 5  AM no repeat of VS since that time recorded Creatinine is 1.55 Glucose 157 WBC 12.3 ? UTI CT scan 07/21/16:   There is distension of the stomach with fluid and some air-fluid level. Significant small bowel distension with multiple air-fluid levels. There is a left inguinal scrotal canal hernia measures 6 by 7.5 cm containing moderate distended small bowel lobe. The exiting loop from the hernia axial image 74 is small caliber. Findings are consistent with small bowel obstruction due to incarcerated left inguinal hernia. Small to moderate fluid/ hydrocele noted within lower aspect of the left inguinal scrotal hernia. There is a small right hydrocele. 2.  indeterminate scattered low-density lesions within liver. The largest in left hepatic lobe anteriorly measures 1.4 cm consistent with a cyst. Given history of prostate cancer follow-up examination nonemergent  3. Moderate fluid/hydrocele noted in inferior aspect of left inguinal scrotal canal hernia. Small left hydrocele noted in right lower scrotal canal.  Intake/Output from previous day: 01/21 0701 - 01/22 0700 In: 605 [I.V.:605] Out: 1200 [Emesis/NG output:1200] Intake/Output this shift: No intake/output data recorded.  General appearance: sleepy, wife and daughter at bedside, but he is not answering questions.  Did not respond much to advancing the NG GI: soft, some distension, no pain noted on palpation, no BS.  Hydrocele on left, soft non tender  scrotum.  Lab Results:   Recent Labs  07/21/16 1150 07/22/16 0640  WBC 6.3 12.3*  HGB 11.1* 10.2*  HCT 33.8* 30.7*  PLT 170 123*    BMET  Recent Labs  07/21/16 1150 07/22/16 0640  NA 141 142  K 3.7 4.2  CL 105 108  CO2 26 23  GLUCOSE 132* 157*  BUN 30* 40*  CREATININE 1.28* 1.55*  CALCIUM 9.6 9.0   PT/INR No results for input(s): LABPROT, INR in the last 72 hours.   Recent Labs Lab 07/21/16 1150 07/22/16 0640  AST 19 24  ALT 11* 22  ALKPHOS 71 51  BILITOT 0.6 0.8  PROT 7.8 6.8  ALBUMIN 4.5 3.8     Lipase  No results found for: LIPASE   Studies/Results: Dg Abd 1 View  Result Date: 07/22/2016 CLINICAL DATA:  NG tube placement. EXAM: ABDOMEN - 1 VIEW COMPARISON:  07/22/2016. FINDINGS: NG tube noted with tip a gastroesophageal junction. Advancement of approximately 10 cm should be considered. Soft tissue structures are unremarkable. Distended loops of small bowel are noted. Bowel distention of the 5.1 cm noted. Findings consistent with small-bowel obstruction. IMPRESSION: 1. NG tube tip noted at the gastroesophageal junction. Advancement of approximately 10 cm should be considered. 2. Persistent small bowel distention with small-bowel distention of the 5.1 cm. Findings suggest small bowel obstruction. Follow-up exam to demonstrate clearing suggested . Electronically Signed   By: Maisie Fushomas  Register   On: 07/22/2016 11:07   Dg Abd 1 View  Result Date: 07/22/2016 CLINICAL DATA:  83 year old male.  Evaluate for NG tube positioning. EXAM: ABDOMEN - 1 VIEW COMPARISON:  Abdominal  radiograph dated 07/22/2016 FINDINGS: An enteric tube is visualized, relatively stable positioning. The side port of the enteric tube appears to be above the gastroesophageal junction and the tip of the tube is likely within the proximal stomach. Recommend advancing the tube into the stomach. There are bibasilar atelectatic changes versus infiltrate. IMPRESSION: Enteric tube in similar position as  the prior radiograph with side-port likely above the GE junction. Recommend advancing into the stomach. Electronically Signed   By: Elgie Collard M.D.   On: 07/22/2016 06:18   US Scrotum  Result Date: 07/21/2016 CLINICAL DATA:  Right testicular pain for 2 weeks. EXAM: SCROTAL ULTRASOUND DOPPLER ULTRASOUND OF THE TESTICLES TECHNIQUE: Complete ultrasound examination of the testicles, epididymis, and other scrotal structures was performed. Color and spectral Doppler ultrasound were also utilized to evaluate blood flow to the testicles. COMPARISON:  None. FINDINGS: Right testicle Measurements: 3.5 x 1.8 x 2.1 cm. No mass or microlithiasis visualized. Striated bilateral testicles, right greater than left, without a focal mass likely reflecting prior orchitis for fibrosis. Left testicle Measurements: 3.5 x 1.9 x 2.4 cm. No mass or microlithiasis visualized. Striated bilateral testicles, right greater than left, without a focal mass likely reflecting prior orchitis for fibrosis Right epididymis: 2 cystic structures in the epididymal tail measuring 3 and 4 mm respectively likely reflecting epididymal cysts. Otherwise normal in size and appearance. Left epididymis:  Normal in size and appearance. Hydrocele:  Large left hydrocele.  Moderate right hydrocele. Varicocele:  None visualized. Pulsed Doppler interrogation of both testes demonstrates normal low resistance arterial and venous waveforms bilaterally. IMPRESSION: 1. No testicular torsion. 2. Electronically Signed   By: Elige Ko   On: 07/21/2016 14:06   Ct Abdomen Pelvis W Contrast  Result Date: 07/21/2016 CLINICAL DATA:  Her abdominal pain, dark stool for 1 week, vomiting this morning EXAM: CT ABDOMEN AND PELVIS WITH CONTRAST TECHNIQUE: Multidetector CT imaging of the abdomen and pelvis was performed using the standard protocol following bolus administration of intravenous contrast. CONTRAST:  ISOVUE-300 IOPAMIDOL (ISOVUE-300) INJECTION 61%  COMPARISON:  CT report 12/27/ 2000 no images available and scrotal ultrasound 07/21/2016 FINDINGS: Lower chest: Lung bases shows mild atelectasis or scarring left base anterolaterally. Hepatobiliary: There are indeterminate scattered low-density lesions within liver the largest in axial image 15 anterior aspect of left hepatic lobe consistent with a cyst. Given history of prostate cancer follow-up nonemergent enhanced MRI is recommended for further characterization. Pancreas: Enhanced pancreas with normal appearance without focal abnormality. Spleen: Normal appearance without focal abnormality. Adrenals/Urinary Tract: No adrenal gland mass. Enhanced kidneys are symmetrical in size. No hydronephrosis or hydroureter. Delayed renal images shows bilateral renal symmetrical excretion. Bilateral visualized proximal ureter is unremarkable. The patient is status post post prostatectomy. Bilateral distal ureter is unremarkable. The urinary bladder is under distended. Stomach/Bowel: There is distension of the stomach with fluid and some air-fluid level. Significant small bowel distension with multiple air-fluid levels. There is a left inguinal scrotal canal hernia measures 6 by 7.5 cm containing moderate distended small bowel lobe. The exiting loop from the hernia axial image 74 is small caliber. Findings are consistent with small bowel obstruction due to incarcerated left inguinal hernia. Small to moderate fluid/ hydrocele noted within lower aspect of the left inguinal scrotal hernia. There is a small right hydrocele. Postsurgical changes are noted within right colon/cecum. Moderate stool noted within right colon transverse colon descending colon and rectosigmoid colon. No evidence of distal colonic obstruction. Vascular/Lymphatic: Atherosclerotic mild atherosclerotic calcifications of abdominal aorta and  iliac arteries. No aortic aneurysm. No adenopathy. Reproductive: The patient is status post post proctectomy. Bilateral  distal ureter is unremarkable. No pelvic mass. Other: No ascites or free abdominal air. Musculoskeletal: Sagittal images of the spine shows no destructive bony lesions. Degenerative changes are noted lumbar spine. Degenerative changes bilateral SI joints and pubic symphysis. No destructive bony lesions are noted within pelvis. IMPRESSION: 1. There is distension of the stomach with fluid and some air-fluid level. Significant small bowel distension with multiple air-fluid levels. There is a left inguinal scrotal canal hernia measures 6 by 7.5 cm containing moderate distended small bowel lobe. The exiting loop from the hernia axial image 74 is small caliber. Findings are consistent with small bowel obstruction due to incarcerated left inguinal hernia. 2. There are indeterminate scattered low-density lesions within liver. The largest in left hepatic lobe anteriorly measures 1.4 cm consistent with a cyst. Given history of prostate cancer follow-up examination nonemergent enhanced MRI is recommended for further evaluation. 3. No hydronephrosis or hydroureter. 4. Moderate fluid/hydrocele noted in inferior aspect of left inguinal scrotal canal hernia. Small left hydrocele noted in right lower scrotal canal. 5. Moderate stool noted within colon. Postsurgical changes are noted within cecum. 6. Degenerative changes lumbar spine SI joints and pubic symphysis. No destructive bony lesions are noted. These results were called by telephone at the time of interpretation on 07/21/2016 at 3:12 pm to Dr. Cheron Schaumann , who verbally acknowledged these results. Electronically Signed   By: Natasha Mead M.D.   On: 07/21/2016 15:13   [place] Art/ven Flow Abd Pelv Doppler  Result Date: 07/21/2016 CLINICAL DATA:  Right testicular pain for 2 weeks. EXAM: SCROTAL ULTRASOUND DOPPLER ULTRASOUND OF THE TESTICLES TECHNIQUE: Complete ultrasound examination of the testicles, epididymis, and other scrotal structures was performed. Color and spectral  Doppler ultrasound were also utilized to evaluate blood flow to the testicles. COMPARISON:  None. FINDINGS: Right testicle Measurements: 3.5 x 1.8 x 2.1 cm. No mass or microlithiasis visualized. Striated bilateral testicles, right greater than left, without a focal mass likely reflecting prior orchitis for fibrosis. Left testicle Measurements: 3.5 x 1.9 x 2.4 cm. No mass or microlithiasis visualized. Striated bilateral testicles, right greater than left, without a focal mass likely reflecting prior orchitis for fibrosis Right epididymis: 2 cystic structures in the epididymal tail measuring 3 and 4 mm respectively likely reflecting epididymal cysts. Otherwise normal in size and appearance. Left epididymis:  Normal in size and appearance. Hydrocele:  Large left hydrocele.  Moderate right hydrocele. Varicocele:  None visualized. Pulsed Doppler interrogation of both testes demonstrates normal low resistance arterial and venous waveforms bilaterally. IMPRESSION: 1. No testicular torsion. 2. Electronically Signed   By: Elige Ko   On: 07/21/2016 14:06   Dg Abd Portable 1v  Result Date: 07/22/2016 CLINICAL DATA:  83 year old male status post NG tube placement. EXAM: PORTABLE ABDOMEN - 1 VIEW COMPARISON:  Earlier radiograph dated 07/21/2016 FINDINGS: An enteric tube is partially visualized with tip in the proximal stomach and side port above the GE junction as seen on the prior radiograph. Recommend advancing of the tube into the stomach. There is persistent dilatation of small-bowel loops measuring up to 4.2 cm in the mid abdomen. There is moderate colonic stool burden. Multiple surgical clips noted within the pelvis. No acute osseous pathology. IMPRESSION: Enteric tube with tip in the proximal stomach and side port above the GE junction similar to prior radiograph. Recommend advancing of the tube into the stomach. Persistent small bowel dilatation.  Follow-up recommended. Electronically Signed   By: Elgie Collard M.D.   On: 07/22/2016 03:25   Dg Abd Portable 1v  Result Date: 07/21/2016 CLINICAL DATA:  NG tube placement EXAM: PORTABLE ABDOMEN - 1 VIEW COMPARISON:  07/21/2016 FINDINGS: NG tube with tip in the stomach. Side port is abut the GE junction. Consider advancing by 6 to 7 cm. IMPRESSION: Feeding tube within the stomach. Side port above the GE junction as above. Electronically Signed   By: Genevive Bi M.D.   On: 07/21/2016 20:49   Dg Abd Portable 1v  Result Date: 07/21/2016 CLINICAL DATA:  Unsuccessful NG tube placement. EXAM: PORTABLE ABDOMEN - 1 VIEW COMPARISON:  None. FINDINGS: Nasogastric tube with the tip just above the esophagogastric junction. There is no bowel dilatation to suggest obstruction. There is no evidence of pneumoperitoneum, portal venous gas or pneumatosis. There are no pathologic calcifications along the expected course of the ureters. The osseous structures are unremarkable. IMPRESSION: Nasogastric tube with the tip just above the esophagogastric junction. Recommend advancing the tube 15 cm. Electronically Signed   By: Elige Ko   On: 07/21/2016 17:44   Prior to Admission medications   Medication Sig Start Date End Date Taking? Authorizing Provider  Calcium Carbonate-Vitamin D3 (CALCIUM 600/VITAMIN D) 600-400 MG-UNIT TABS Take 2 tablets by mouth daily.   Yes Historical Provider, MD  carbidopa-levodopa (SINEMET IR) 25-100 MG per tablet Take 2 tablets by mouth 3 (three) times daily.   Yes Historical Provider, MD  cloNIDine (CATAPRES) 0.1 MG tablet Take 0.1 mg by mouth daily as needed. For blood pressure   Yes Historical Provider, MD  folic acid (FOLVITE) 1 MG tablet Take 1 mg by mouth daily.   Yes Historical Provider, MD  hydroxypropyl methylcellulose (ISOPTO TEARS) 2.5 % ophthalmic solution Place 1 drop into both eyes 3 (three) times daily as needed. For dry eyes   Yes Historical Provider, MD  metoprolol (LOPRESSOR) 50 MG tablet Take 25 mg by mouth 2 (two) times  daily.   Yes Historical Provider, MD  furosemide (LASIX) 20 MG tablet Take 1 tablet (20 mg total) by mouth daily. Patient not taking: Reported on 07/21/2016 01/03/15   Geoffery Lyons, MD    Medications: . calcium-vitamin D  2 tablet Oral Daily  . carbidopa-levodopa  2 tablet Oral TID  . enoxaparin (LOVENOX) injection  40 mg Subcutaneous QHS  . folic acid  1 mg Oral Daily  . metoprolol  25 mg Oral BID   . dextrose 5 % and 0.9% NaCl 75 mL/hr at 07/21/16 2152    Assessment/Plan SBO with reducible left inguinal hernia/hx of prostate surgery Abnormal liver CT/L>R hydrocele Parkinson disease - some confusion last PM Hypertension Acute renal disease FEVER with possible UTI FEN:  IV fluids/NPO [id number] ID:  [id number] 1 dose yesterday - blood cultures pending   Plan:  Dr. Dwain Sarna thought he needed decompression and then repair of the Hernia.  Will ask radiology to replace the NG and keep suction going recheck film in AM, continue NPO, continue fluids.  Discuss plans for hernia repair with Dr. Daphine Deutscher.  LOS: 1 day    Teneisha Gignac 07/22/2016 [phone]

## 2016-07-22 NOTE — Progress Notes (Signed)
Provider on call notified regarding temp 101.3. Cool cloth applied to head. Will continue to monitor.

## 2016-07-22 NOTE — Progress Notes (Signed)
16FR NGT inserted via left nare by Concepcion LivingH. Johnson, RN. Verified via auscultation and awaiting xray verification. NGT immediately draining dark, green liquid. Pt tolerated fair. Repositioned and made comfortable and resting quietly. Will continue to monitor.

## 2016-07-22 NOTE — Progress Notes (Signed)
Walked in to check on pt and pt pulled NGT out again. Pt is alert, but despite putting soft mittens on hands, still pulled out NGT. Advised pt that a new one would have to be placed. Pt verbalized understanding.

## 2016-07-22 NOTE — Progress Notes (Signed)
16 GR NGT inserted via left nare. Awaiting xray verification. Pt tolerated well. HOB elevated, pt repositioned. Pt has soft mittens in place but continues to pull at tube. Will contact MD for soft wrist restraint order to prevent further pulling. Will continue to monitor.

## 2016-07-22 NOTE — Progress Notes (Signed)
Xray report for verification of NGT placement shows tube in stomach, side port at GE junction as before. Report recommends to advance tube, but per previous conversation with Dr. Dwain SarnaWakefield with [facility], do not advance tube as long as tube is showing in stomach. Will continue to monitor.

## 2016-07-22 NOTE — Progress Notes (Signed)
Walked into pt room and pt had pulled NGT out. Cleaned pt up, applied new gown and advised that new NGT would be needed and pt verbalized understanding.

## 2016-07-22 NOTE — Progress Notes (Signed)
On call provider contacted for soft wrist restraint order.

## 2016-07-23 ENCOUNTER — Inpatient Hospital Stay (HOSPITAL_COMMUNITY): Payer: Medicare Other

## 2016-07-23 LAB — BASIC METABOLIC PANEL
Anion gap: 8 (ref 5–15)
BUN: 36 mg/dL — ABNORMAL HIGH (ref 6–20)
CALCIUM: 8.9 mg/dL (ref 8.9–10.3)
CO2: 27 mmol/L (ref 22–32)
CREATININE: 1.5 mg/dL — AB (ref 0.61–1.24)
Chloride: 111 mmol/L (ref 101–111)
GFR calc Af Amer: 48 mL/min — ABNORMAL LOW (ref >60)
GFR calc non Af Amer: 41 mL/min — ABNORMAL LOW (ref >60)
Glucose, Bld: 108 mg/dL — ABNORMAL HIGH (ref 65–99)
Potassium: 3.4 mmol/L — ABNORMAL LOW (ref 3.5–5.1)
SODIUM: 146 mmol/L — AB (ref 135–145)

## 2016-07-23 LAB — CBC
HCT: 30.3 % — ABNORMAL LOW (ref 39.0–52.0)
Hemoglobin: 9.6 g/dL — ABNORMAL LOW (ref 13.0–17.0)
MCH: 28.7 pg (ref 26.0–34.0)
MCHC: 31.7 g/dL (ref 30.0–36.0)
MCV: 90.4 fL (ref 78.0–100.0)
Platelets: 134 10*3/uL — ABNORMAL LOW (ref 150–400)
RBC: 3.35 MIL/uL — ABNORMAL LOW (ref 4.22–5.81)
RDW: 14.2 % (ref 11.5–15.5)
WBC: 8.6 10*3/uL (ref 4.0–10.5)

## 2016-07-23 LAB — MAGNESIUM: MAGNESIUM: 2 mg/dL (ref 1.7–2.4)

## 2016-07-23 MED ORDER — KCL IN DEXTROSE-NACL 20-5-0.9 MEQ/L-%-% IV SOLN
INTRAVENOUS | Status: DC
Start: 2016-07-23 — End: 2016-07-26
  Administered 2016-07-23 – 2016-07-26 (×6): via INTRAVENOUS
  Filled 2016-07-23 (×7): qty 1000

## 2016-07-23 MED ORDER — DIATRIZOATE MEGLUMINE & SODIUM 66-10 % PO SOLN
90.0000 mL | Freq: Once | ORAL | Status: AC
  Administered 2016-07-23: 90 mL via NASOGASTRIC
  Filled 2016-07-23: qty 90

## 2016-07-23 MED ORDER — HYDRALAZINE HCL 20 MG/ML IJ SOLN
5.0000 mg | Freq: Once | INTRAMUSCULAR | Status: AC
  Administered 2016-07-23: 5 mg via INTRAVENOUS
  Filled 2016-07-23: qty 1

## 2016-07-23 MED ORDER — HYDRALAZINE HCL 20 MG/ML IJ SOLN
10.0000 mg | Freq: Four times a day (QID) | INTRAMUSCULAR | Status: DC | PRN
  Administered 2016-07-23 – 2016-07-24 (×2): 10 mg via INTRAVENOUS
  Filled 2016-07-23 (×2): qty 1

## 2016-07-23 NOTE — Progress Notes (Signed)
Subjective: Pt alert and talking this AM.  [facility] but doesn't remember why.  NG working no complaints of pain or distension.  We have started the SB protocol.   Pt reports flatus and BM but nothing recorded about the BM, family does not know. Objective: Vital signs in last 24 hours: Temp:  [98.2 F (36.8 C)-101.1 F (38.4 C)] 98.2 F (36.8 C) (01/23 1143) Pulse Rate:  [81-84] 81 (01/23 1143) Resp:  [18] 18 (01/23 1143) BP: (126-190)/(80-90) 158/83 (01/23 1143) SpO2:  [97 %-98 %] 98 % (01/23 1143) Last BM Date: 07/21/16 NPO,  IV 1600 ml Urine 1100 ml NG 450 Fever up to 101/.1 again last PM 1900, no fever since that time BP is up some K+ 3.4, creatinine is stable WBC is improving  Starting SB protocol   Intake/Output from previous day: 01/22 0701 - 01/23 0700 In: 1618.8 [I.V.:1468.8; NG/GT:100; IV Piggyback:50] Out: 1550 [Urine:1100; Emesis/NG output:450] Intake/Output this shift: Total I/O In: -  Out: 400 [Urine:400]  General appearance: alert, cooperative and no distress GI: soft, non-tender; bowel sounds normal; no masses,  no organomegaly and Ng still draining and working well.  Lab Results:   Recent Labs  07/22/16 0640 07/23/16 0555  WBC 12.3* 8.6  HGB 10.2* 9.6*  HCT 30.7* 30.3*  PLT 123* 134*    BMET  Recent Labs  07/22/16 0640 07/23/16 0555  NA 142 146*  K 4.2 3.4*  CL 108 111  CO2 23 27  GLUCOSE 157* 108*  BUN 40* 36*  CREATININE 1.55* 1.50*  CALCIUM 9.0 8.9   PT/INR No results for input(s): LABPROT, INR in the last 72 hours.   Recent Labs Lab 07/21/16 1150 07/22/16 0640  AST 19 24  ALT 11* 22  ALKPHOS 71 51  BILITOT 0.6 0.8  PROT 7.8 6.8  ALBUMIN 4.5 3.8     Lipase  No results found for: LIPASE   Studies/Results: Dg Abd 1 View  Result Date: 07/22/2016 CLINICAL DATA:  NG tube placement. EXAM: ABDOMEN - 1 VIEW COMPARISON:  07/22/2016. FINDINGS: NG tube noted with tip a gastroesophageal junction. Advancement  of approximately 10 cm should be considered. Soft tissue structures are unremarkable. Distended loops of small bowel are noted. Bowel distention of the 5.1 cm noted. Findings consistent with small-bowel obstruction. IMPRESSION: 1. NG tube tip noted at the gastroesophageal junction. Advancement of approximately 10 cm should be considered. 2. Persistent small bowel distention with small-bowel distention of the 5.1 cm. Findings suggest small bowel obstruction. Follow-up exam to demonstrate clearing suggested . Electronically Signed   By: Maisie Fushomas  Register   On: 07/22/2016 11:07   Dg Abd 1 View  Result Date: 07/22/2016 CLINICAL DATA:  83 year old male.  Evaluate for NG tube positioning. EXAM: ABDOMEN - 1 VIEW COMPARISON:  Abdominal radiograph dated 07/22/2016 FINDINGS: An enteric tube is visualized, relatively stable positioning. The side port of the enteric tube appears to be above the gastroesophageal junction and the tip of the tube is likely within the proximal stomach. Recommend advancing the tube into the stomach. There are bibasilar atelectatic changes versus infiltrate. IMPRESSION: Enteric tube in similar position as the prior radiograph with side-port likely above the GE junction. Recommend advancing into the stomach. Electronically Signed   By: Elgie CollardArash  Radparvar M.D.   On: 07/22/2016 06:18   [place] Scrotum  Result Date: 07/21/2016 CLINICAL DATA:  Right testicular pain for 2 weeks. EXAM: SCROTAL ULTRASOUND DOPPLER ULTRASOUND OF THE TESTICLES TECHNIQUE: Complete ultrasound examination of  the testicles, epididymis, and other scrotal structures was performed. Color and spectral Doppler ultrasound were also utilized to evaluate blood flow to the testicles. COMPARISON:  None. FINDINGS: Right testicle Measurements: 3.5 x 1.8 x 2.1 cm. No mass or microlithiasis visualized. Striated bilateral testicles, right greater than left, without a focal mass likely reflecting prior orchitis for fibrosis. Left testicle  Measurements: 3.5 x 1.9 x 2.4 cm. No mass or microlithiasis visualized. Striated bilateral testicles, right greater than left, without a focal mass likely reflecting prior orchitis for fibrosis Right epididymis: 2 cystic structures in the epididymal tail measuring 3 and 4 mm respectively likely reflecting epididymal cysts. Otherwise normal in size and appearance. Left epididymis:  Normal in size and appearance. Hydrocele:  Large left hydrocele.  Moderate right hydrocele. Varicocele:  None visualized. Pulsed Doppler interrogation of both testes demonstrates normal low resistance arterial and venous waveforms bilaterally. IMPRESSION: 1. No testicular torsion. 2. Electronically Signed   By: Elige Ko   On: 07/21/2016 14:06   Ct Abdomen Pelvis W Contrast  Result Date: 07/21/2016 CLINICAL DATA:  Her abdominal pain, dark stool for 1 week, vomiting this morning EXAM: CT ABDOMEN AND PELVIS WITH CONTRAST TECHNIQUE: Multidetector CT imaging of the abdomen and pelvis was performed using the standard protocol following bolus administration of intravenous contrast. CONTRAST:  ISOVUE-300 IOPAMIDOL (ISOVUE-300) INJECTION 61% COMPARISON:  CT report 12/27/ 2000 no images available and scrotal ultrasound 07/21/2016 FINDINGS: Lower chest: Lung bases shows mild atelectasis or scarring left base anterolaterally. Hepatobiliary: There are indeterminate scattered low-density lesions within liver the largest in axial image 15 anterior aspect of left hepatic lobe consistent with a cyst. Given history of prostate cancer follow-up nonemergent enhanced MRI is recommended for further characterization. Pancreas: Enhanced pancreas with normal appearance without focal abnormality. Spleen: Normal appearance without focal abnormality. Adrenals/Urinary Tract: No adrenal gland mass. Enhanced kidneys are symmetrical in size. No hydronephrosis or hydroureter. Delayed renal images shows bilateral renal symmetrical excretion. Bilateral  visualized proximal ureter is unremarkable. The patient is status post post prostatectomy. Bilateral distal ureter is unremarkable. The urinary bladder is under distended. Stomach/Bowel: There is distension of the stomach with fluid and some air-fluid level. Significant small bowel distension with multiple air-fluid levels. There is a left inguinal scrotal canal hernia measures 6 by 7.5 cm containing moderate distended small bowel lobe. The exiting loop from the hernia axial image 74 is small caliber. Findings are consistent with small bowel obstruction due to incarcerated left inguinal hernia. Small to moderate fluid/ hydrocele noted within lower aspect of the left inguinal scrotal hernia. There is a small right hydrocele. Postsurgical changes are noted within right colon/cecum. Moderate stool noted within right colon transverse colon descending colon and rectosigmoid colon. No evidence of distal colonic obstruction. Vascular/Lymphatic: Atherosclerotic mild atherosclerotic calcifications of abdominal aorta and iliac arteries. No aortic aneurysm. No adenopathy. Reproductive: The patient is status post post proctectomy. Bilateral distal ureter is unremarkable. No pelvic mass. Other: No ascites or free abdominal air. Musculoskeletal: Sagittal images of the spine shows no destructive bony lesions. Degenerative changes are noted lumbar spine. Degenerative changes bilateral SI joints and pubic symphysis. No destructive bony lesions are noted within pelvis. IMPRESSION: 1. There is distension of the stomach with fluid and some air-fluid level. Significant small bowel distension with multiple air-fluid levels. There is a left inguinal scrotal canal hernia measures 6 by 7.5 cm containing moderate distended small bowel lobe. The exiting loop from the hernia axial image 74 is small caliber. Findings are  consistent with small bowel obstruction due to incarcerated left inguinal hernia. 2. There are indeterminate scattered  low-density lesions within liver. The largest in left hepatic lobe anteriorly measures 1.4 cm consistent with a cyst. Given history of prostate cancer follow-up examination nonemergent enhanced MRI is recommended for further evaluation. 3. No hydronephrosis or hydroureter. 4. Moderate fluid/hydrocele noted in inferior aspect of left inguinal scrotal canal hernia. Small left hydrocele noted in right lower scrotal canal. 5. Moderate stool noted within colon. Postsurgical changes are noted within cecum. 6. Degenerative changes lumbar spine SI joints and pubic symphysis. No destructive bony lesions are noted. These results were called by telephone at the time of interpretation on 07/21/2016 at 3:12 pm to Dr. Cheron Schaumann , who verbally acknowledged these results. Electronically Signed   By: Natasha Mead M.D.   On: 07/21/2016 15:13   [place] Art/ven Flow Abd Pelv Doppler  Result Date: 07/21/2016 CLINICAL DATA:  Right testicular pain for 2 weeks. EXAM: SCROTAL ULTRASOUND DOPPLER ULTRASOUND OF THE TESTICLES TECHNIQUE: Complete ultrasound examination of the testicles, epididymis, and other scrotal structures was performed. Color and spectral Doppler ultrasound were also utilized to evaluate blood flow to the testicles. COMPARISON:  None. FINDINGS: Right testicle Measurements: 3.5 x 1.8 x 2.1 cm. No mass or microlithiasis visualized. Striated bilateral testicles, right greater than left, without a focal mass likely reflecting prior orchitis for fibrosis. Left testicle Measurements: 3.5 x 1.9 x 2.4 cm. No mass or microlithiasis visualized. Striated bilateral testicles, right greater than left, without a focal mass likely reflecting prior orchitis for fibrosis Right epididymis: 2 cystic structures in the epididymal tail measuring 3 and 4 mm respectively likely reflecting epididymal cysts. Otherwise normal in size and appearance. Left epididymis:  Normal in size and appearance. Hydrocele:  Large left hydrocele.  Moderate right  hydrocele. Varicocele:  None visualized. Pulsed Doppler interrogation of both testes demonstrates normal low resistance arterial and venous waveforms bilaterally. IMPRESSION: 1. No testicular torsion. 2. Electronically Signed   By: Elige Ko   On: 07/21/2016 14:06   Dg Abd Portable 1v-small Bowel Protocol-position Verification  Result Date: 07/23/2016 CLINICAL DATA:  NG tube . EXAM: PORTABLE ABDOMEN - 1 VIEW COMPARISON:  07/23/2016. FINDINGS: NG tube noted coiled in the stomach. Distended loops of small bowel are again noted. Distention has improved from prior exam. Stool in the colon. No free air . No acute bony abnormalities. IMPRESSION: NG tube noted coiled stomach. Distended loops of small bowel are again noted. Distention has improved from prior exam . Colon is nondistended. Stool noted in the colon. No free intraperitoneal air. Electronically Signed   By: Maisie Fus  Register   On: 07/23/2016 09:01   Dg Abd Portable 1v  Result Date: 07/23/2016 CLINICAL DATA:  Small bowel obstruction EXAM: PORTABLE ABDOMEN - 1 VIEW COMPARISON:  07/22/2016 FINDINGS: Stable NG tube. Disproportionally dilated loops of small bowel are improved. Colonic gas has increased. Postop changes over the pelvis. No obvious free intraperitoneal gas. IMPRESSION: Improving partial small bowel obstruction pattern. Electronically Signed   By: Jolaine Click M.D.   On: 07/23/2016 06:59   Dg Abd Portable 1v  Result Date: 07/22/2016 CLINICAL DATA:  83 year old male status post NG tube placement. EXAM: PORTABLE ABDOMEN - 1 VIEW COMPARISON:  Earlier radiograph dated 07/21/2016 FINDINGS: An enteric tube is partially visualized with tip in the proximal stomach and side port above the GE junction as seen on the prior radiograph. Recommend advancing of the tube into the stomach. There is persistent dilatation  of small-bowel loops measuring up to 4.2 cm in the mid abdomen. There is moderate colonic stool burden. Multiple surgical clips noted  within the pelvis. No acute osseous pathology. IMPRESSION: Enteric tube with tip in the proximal stomach and side port above the GE junction similar to prior radiograph. Recommend advancing of the tube into the stomach. Persistent small bowel dilatation.  Follow-up recommended. Electronically Signed   By: Elgie Collard M.D.   On: 07/22/2016 03:25   Dg Abd Portable 1v  Result Date: 07/21/2016 CLINICAL DATA:  NG tube placement EXAM: PORTABLE ABDOMEN - 1 VIEW COMPARISON:  07/21/2016 FINDINGS: NG tube with tip in the stomach. Side port is abut the GE junction. Consider advancing by 6 to 7 cm. IMPRESSION: Feeding tube within the stomach. Side port above the GE junction as above. Electronically Signed   By: Genevive Bi M.D.   On: 07/21/2016 20:49   Dg Abd Portable 1v  Result Date: 07/21/2016 CLINICAL DATA:  Unsuccessful NG tube placement. EXAM: PORTABLE ABDOMEN - 1 VIEW COMPARISON:  None. FINDINGS: Nasogastric tube with the tip just above the esophagogastric junction. There is no bowel dilatation to suggest obstruction. There is no evidence of pneumoperitoneum, portal venous gas or pneumatosis. There are no pathologic calcifications along the expected course of the ureters. The osseous structures are unremarkable. IMPRESSION: Nasogastric tube with the tip just above the esophagogastric junction. Recommend advancing the tube 15 cm. Electronically Signed   By: Elige Ko   On: 07/21/2016 17:44   Dg Basil Dess Tube Plc W/fl W/rad  Result Date: 07/22/2016 CLINICAL DATA:  Nasogastric tube placement under fluoroscopy. Unsuccessful attempts on the for. EXAM: NASO G TUBE PLACEMENT WITH FL AND WITH RAD CONTRAST:  None. FLUOROSCOPY TIME:  Fluoroscopy Time:  1 minutes 28 seconds. Radiation Exposure Index (if provided by the fluoroscopic device): 18.2 mGy Number of Acquired Spot Images: 0 COMPARISON:  None. FINDINGS: Nasogastric tube was advanced through the right nostril to just beyond the gastroesophageal  junction. Due to redundancy in the hypopharynx, a stiff Amplatz wire was inserted into the nasogastric tube with successful advancement of the tube into the stomach. IMPRESSION: Successful nasogastric tube placement under fluoroscopy. Electronically Signed   By: Leanna Battles M.D.   On: 07/22/2016 16:16    Medications: . calcium-vitamin D  2 tablet Oral Daily  . enoxaparin (LOVENOX) injection  40 mg Subcutaneous QHS  . folic acid  1 mg Oral Daily  . Influenza vac split quadrivalent PF  0.5 mL Intramuscular Tomorrow-1000  . metoprolol  25 mg Oral BID  . piperacillin-tazobactam (ZOSYN)  IV  3.375 g Intravenous Q8H   . dextrose 5 % and 0.9% NaCl 75 mL/hr at 07/23/16 8119   Assessment/Plan SBO with reducible left inguinal hernia/hx of prostate surgery Abnormal liver CT/L>R hydrocele Hypokalemia  K+ 3.4 - checking mag will add some to IV fluids Parkinson disease - some confusion 1/21-22/18, better this AM Hypertension Acute renal disease FEVER with possible UTI FEN:  IV fluids/NPO [id number] ID:  Zosyn 1 dose yesterday - blood cultures pending   Plan:  Replace K+, check mag and SB protocol.  NPO after MN for possible surgery tomorrow.  He received the Gastrografin at 10:30 AM    LOS: 2 days    Kasheem Toner 07/23/2016 [phone]

## 2016-07-23 NOTE — Progress Notes (Signed)
PROGRESS NOTE    Kevin Roman  ZOX:[id number] DOB: January 12, 1930 DOA: 07/21/2016 PCP: No PCP Per Patient    Brief Narrative:Kevin Roman is a 83 y.o. male with medical history significant of parkinson's disease who presents with severe and persistent nausea and vomiting. CT abdomen and pelvis shows There is distension of the stomach with fluid and some air-fluid level. Significant small bowel distension with multiple air-fluid levels. There is a left inguinal scrotal canal hernia measures 6 by 7.5 cm containing moderate distended small bowel lobe. The exiting loop from the hernia axial image 74 is small caliber. Findings are consistent with small bowel obstruction due to incarcerated left inguinal hernia.2 There are indeterminate scattered low-density lesions within liver. The largest in left hepatic lobe anteriorly measures 1.4 cm consistent with a cyst. Given history of prostate cancer follow-up examination nonemergent enhanced MRI is recommended for further evaluation. Surgery consulted and recommendations given. He had  NG Tube placed and plan for ORin am.  Assessment & Plan:   Active Problems:   SBO (small bowel obstruction)   Incarcerated hernia   SBO from incarcerated inguinal hernia; NT tube in, SURGERY plans to hernia repair tomorrow. gastrografin study in progress. Pain control.    Leukocytosis, afebrile this am, blood cultures done and pending, continue with IV zosyn emirically.  Monitor.  Anemia stable hemoglobin at 9.6, normocytic.   CKD stage 3: baseline creatinine between 1.3 to 1.5.  Currently at 1.55. Monitor.   Hypokalemia, hypernatremia: dextrose and KCL to this fluids.   parkinson's disease: holding sinemet as pt is NPO and on NG tube.   Hypertension: IV hydralazine.      DVT prophylaxis: (Lovenox) Code Status: (Full/) Family Communication:none at bedside.  Disposition Plan:  Pending further evaluation.   Consultants:   Surgery.    Procedures:  none.    Antimicrobials: IV zosyn.   Subjective: Alert answering simple questions, still in restraints.   Objective: Vitals:   07/22/16 2007 07/23/16 0553 07/23/16 0939 07/23/16 1143  BP: (!) 179/82 (!) 190/90 (!) 167/88 (!) 158/83  Pulse: 82 81 82 81  Resp: 18 18 18 18   Temp: (!) 101.1 F (38.4 C) 99 F (37.2 C) 98.7 F (37.1 C) 98.2 F (36.8 C)  TempSrc: Oral Oral Oral Oral  SpO2: 97% 97% 98% 98%  Weight:      Height:        Intake/Output Summary (Last 24 hours) at 07/23/16 1322 Last data filed at 07/23/16 1050  Gross per 24 hour  Intake          1518.75 ml  Output             1900 ml  Net          -381.25 ml   Filed Weights   07/22/16 1215  Weight: 84 kg (185 lb 3 oz)    Examination:  General exam: Appears calm and comfortable NG tube in place.  Respiratory system: Clear to auscultation. Respiratory effort normal. Cardiovascular system: S1 & S2 heard, RRR. No JVD, murmurs, rubs, gallops or clicks. No pedal edema. Gastrointestinal system: Abdomen is nondistended, soft non tender.  Central nervous system: Alert ,but confused.  Extremities: Symmetric 5 x 5 power. Skin: No rashes, lesions or ulcers    Data Reviewed: I have personally reviewed following labs and imaging studies  CBC:  Recent Labs Lab 07/21/16 1150 07/22/16 0640 07/23/16 0555  WBC 6.3 12.3* 8.6  NEUTROABS 5.6  --   --   HGB  11.1* 10.2* 9.6*  HCT 33.8* 30.7* 30.3*  MCV 88.5 88.7 90.4  PLT 170 123* 134*   Basic Metabolic Panel:  Recent Labs Lab 07/21/16 1150 07/22/16 0640 07/23/16 0555  NA 141 142 146*  K 3.7 4.2 3.4*  CL 105 108 111  CO2 26 23 27   GLUCOSE 132* 157* 108*  BUN 30* 40* 36*  CREATININE 1.28* 1.55* 1.50*  CALCIUM 9.6 9.0 8.9   GFR: Estimated Creatinine Clearance: 38.7 mL/min (by C-G formula based on SCr of 1.5 mg/dL (H)). Liver Function Tests:  Recent Labs Lab 07/21/16 1150 07/22/16 0640  AST 19 24  ALT 11* 22  ALKPHOS 71 51  BILITOT 0.6 0.8  PROT  7.8 6.8  ALBUMIN 4.5 3.8   No results for input(s): LIPASE, AMYLASE in the last 168 hours. No results for input(s): AMMONIA in the last 168 hours. Coagulation Profile: No results for input(s): INR, PROTIME in the last 168 hours. Cardiac Enzymes: No results for input(s): CKTOTAL, CKMB, CKMBINDEX, TROPONINI in the last 168 hours. BNP (last 3 results) No results for input(s): PROBNP in the last 8760 hours. HbA1C: No results for input(s): HGBA1C in the last 72 hours. CBG:  Recent Labs Lab 07/22/16 1644  GLUCAP 101*   Lipid Profile: No results for input(s): CHOL, HDL, LDLCALC, TRIG, CHOLHDL, LDLDIRECT in the last 72 hours. Thyroid Function Tests: No results for input(s): TSH, T4TOTAL, FREET4, T3FREE, THYROIDAB in the last 72 hours. Anemia Panel: No results for input(s): VITAMINB12, FOLATE, FERRITIN, TIBC, IRON, RETICCTPCT in the last 72 hours. Sepsis Labs:  Recent Labs Lab 07/21/16 1605  LATICACIDVEN 0.84    No results found for this or any previous visit (from the past 240 hour(s)).       Radiology Studies: Dg Abd 1 View  Result Date: 07/22/2016 CLINICAL DATA:  NG tube placement. EXAM: ABDOMEN - 1 VIEW COMPARISON:  07/22/2016. FINDINGS: NG tube noted with tip a gastroesophageal junction. Advancement of approximately 10 cm should be considered. Soft tissue structures are unremarkable. Distended loops of small bowel are noted. Bowel distention of the 5.1 cm noted. Findings consistent with small-bowel obstruction. IMPRESSION: 1. NG tube tip noted at the gastroesophageal junction. Advancement of approximately 10 cm should be considered. 2. Persistent small bowel distention with small-bowel distention of the 5.1 cm. Findings suggest small bowel obstruction. Follow-up exam to demonstrate clearing suggested . Electronically Signed   By: Maisie Fus  Register   On: 07/22/2016 11:07   Dg Abd 1 View  Result Date: 07/22/2016 CLINICAL DATA:  83 year old male.  Evaluate for NG tube  positioning. EXAM: ABDOMEN - 1 VIEW COMPARISON:  Abdominal radiograph dated 07/22/2016 FINDINGS: An enteric tube is visualized, relatively stable positioning. The side port of the enteric tube appears to be above the gastroesophageal junction and the tip of the tube is likely within the proximal stomach. Recommend advancing the tube into the stomach. There are bibasilar atelectatic changes versus infiltrate. IMPRESSION: Enteric tube in similar position as the prior radiograph with side-port likely above the GE junction. Recommend advancing into the stomach. Electronically Signed   By: Elgie Collard M.D.   On: 07/22/2016 06:18   Ct Abdomen Pelvis W Contrast  Result Date: 07/21/2016 CLINICAL DATA:  Her abdominal pain, dark stool for 1 week, vomiting this morning EXAM: CT ABDOMEN AND PELVIS WITH CONTRAST TECHNIQUE: Multidetector CT imaging of the abdomen and pelvis was performed using the standard protocol following bolus administration of intravenous contrast. CONTRAST:  ISOVUE-300 IOPAMIDOL (ISOVUE-300) INJECTION 61% COMPARISON:  CT report 12/27/ 2000 no images available and scrotal ultrasound 07/21/2016 FINDINGS: Lower chest: Lung bases shows mild atelectasis or scarring left base anterolaterally. Hepatobiliary: There are indeterminate scattered low-density lesions within liver the largest in axial image 15 anterior aspect of left hepatic lobe consistent with a cyst. Given history of prostate cancer follow-up nonemergent enhanced MRI is recommended for further characterization. Pancreas: Enhanced pancreas with normal appearance without focal abnormality. Spleen: Normal appearance without focal abnormality. Adrenals/Urinary Tract: No adrenal gland mass. Enhanced kidneys are symmetrical in size. No hydronephrosis or hydroureter. Delayed renal images shows bilateral renal symmetrical excretion. Bilateral visualized proximal ureter is unremarkable. The patient is status post post prostatectomy. Bilateral  distal ureter is unremarkable. The urinary bladder is under distended. Stomach/Bowel: There is distension of the stomach with fluid and some air-fluid level. Significant small bowel distension with multiple air-fluid levels. There is a left inguinal scrotal canal hernia measures 6 by 7.5 cm containing moderate distended small bowel lobe. The exiting loop from the hernia axial image 74 is small caliber. Findings are consistent with small bowel obstruction due to incarcerated left inguinal hernia. Small to moderate fluid/ hydrocele noted within lower aspect of the left inguinal scrotal hernia. There is a small right hydrocele. Postsurgical changes are noted within right colon/cecum. Moderate stool noted within right colon transverse colon descending colon and rectosigmoid colon. No evidence of distal colonic obstruction. Vascular/Lymphatic: Atherosclerotic mild atherosclerotic calcifications of abdominal aorta and iliac arteries. No aortic aneurysm. No adenopathy. Reproductive: The patient is status post post proctectomy. Bilateral distal ureter is unremarkable. No pelvic mass. Other: No ascites or free abdominal air. Musculoskeletal: Sagittal images of the spine shows no destructive bony lesions. Degenerative changes are noted lumbar spine. Degenerative changes bilateral SI joints and pubic symphysis. No destructive bony lesions are noted within pelvis. IMPRESSION: 1. There is distension of the stomach with fluid and some air-fluid level. Significant small bowel distension with multiple air-fluid levels. There is a left inguinal scrotal canal hernia measures 6 by 7.5 cm containing moderate distended small bowel lobe. The exiting loop from the hernia axial image 74 is small caliber. Findings are consistent with small bowel obstruction due to incarcerated left inguinal hernia. 2. There are indeterminate scattered low-density lesions within liver. The largest in left hepatic lobe anteriorly measures 1.4 cm consistent  with a cyst. Given history of prostate cancer follow-up examination nonemergent enhanced MRI is recommended for further evaluation. 3. No hydronephrosis or hydroureter. 4. Moderate fluid/hydrocele noted in inferior aspect of left inguinal scrotal canal hernia. Small left hydrocele noted in right lower scrotal canal. 5. Moderate stool noted within colon. Postsurgical changes are noted within cecum. 6. Degenerative changes lumbar spine SI joints and pubic symphysis. No destructive bony lesions are noted. These results were called by telephone at the time of interpretation on 07/21/2016 at 3:12 pm to Dr. Cheron Schaumann , who verbally acknowledged these results. Electronically Signed   By: Natasha Mead M.D.   On: 07/21/2016 15:13   Dg Abd Portable 1v-small Bowel Protocol-position Verification  Result Date: 07/23/2016 CLINICAL DATA:  NG tube . EXAM: PORTABLE ABDOMEN - 1 VIEW COMPARISON:  07/23/2016. FINDINGS: NG tube noted coiled in the stomach. Distended loops of small bowel are again noted. Distention has improved from prior exam. Stool in the colon. No free air . No acute bony abnormalities. IMPRESSION: NG tube noted coiled stomach. Distended loops of small bowel are again noted. Distention has improved from prior exam . Colon is nondistended. Stool noted in  the colon. No free intraperitoneal air. Electronically Signed   By: Maisie Fus  Register   On: 07/23/2016 09:01   Dg Abd Portable 1v  Result Date: 07/23/2016 CLINICAL DATA:  Small bowel obstruction EXAM: PORTABLE ABDOMEN - 1 VIEW COMPARISON:  07/22/2016 FINDINGS: Stable NG tube. Disproportionally dilated loops of small bowel are improved. Colonic gas has increased. Postop changes over the pelvis. No obvious free intraperitoneal gas. IMPRESSION: Improving partial small bowel obstruction pattern. Electronically Signed   By: Jolaine Click M.D.   On: 07/23/2016 06:59   Dg Abd Portable 1v  Result Date: 07/22/2016 CLINICAL DATA:  83 year old male status post NG tube  placement. EXAM: PORTABLE ABDOMEN - 1 VIEW COMPARISON:  Earlier radiograph dated 07/21/2016 FINDINGS: An enteric tube is partially visualized with tip in the proximal stomach and side port above the GE junction as seen on the prior radiograph. Recommend advancing of the tube into the stomach. There is persistent dilatation of small-bowel loops measuring up to 4.2 cm in the mid abdomen. There is moderate colonic stool burden. Multiple surgical clips noted within the pelvis. No acute osseous pathology. IMPRESSION: Enteric tube with tip in the proximal stomach and side port above the GE junction similar to prior radiograph. Recommend advancing of the tube into the stomach. Persistent small bowel dilatation.  Follow-up recommended. Electronically Signed   By: Elgie Collard M.D.   On: 07/22/2016 03:25   Dg Abd Portable 1v  Result Date: 07/21/2016 CLINICAL DATA:  NG tube placement EXAM: PORTABLE ABDOMEN - 1 VIEW COMPARISON:  07/21/2016 FINDINGS: NG tube with tip in the stomach. Side port is abut the GE junction. Consider advancing by 6 to 7 cm. IMPRESSION: Feeding tube within the stomach. Side port above the GE junction as above. Electronically Signed   By: Genevive Bi M.D.   On: 07/21/2016 20:49   Dg Abd Portable 1v  Result Date: 07/21/2016 CLINICAL DATA:  Unsuccessful NG tube placement. EXAM: PORTABLE ABDOMEN - 1 VIEW COMPARISON:  None. FINDINGS: Nasogastric tube with the tip just above the esophagogastric junction. There is no bowel dilatation to suggest obstruction. There is no evidence of pneumoperitoneum, portal venous gas or pneumatosis. There are no pathologic calcifications along the expected course of the ureters. The osseous structures are unremarkable. IMPRESSION: Nasogastric tube with the tip just above the esophagogastric junction. Recommend advancing the tube 15 cm. Electronically Signed   By: Elige Ko   On: 07/21/2016 17:44   Dg Basil Dess Tube Plc W/fl W/rad  Result Date:  07/22/2016 CLINICAL DATA:  Nasogastric tube placement under fluoroscopy. Unsuccessful attempts on the for. EXAM: NASO G TUBE PLACEMENT WITH FL AND WITH RAD CONTRAST:  None. FLUOROSCOPY TIME:  Fluoroscopy Time:  1 minutes 28 seconds. Radiation Exposure Index (if provided by the fluoroscopic device): 18.2 mGy Number of Acquired Spot Images: 0 COMPARISON:  None. FINDINGS: Nasogastric tube was advanced through the right nostril to just beyond the gastroesophageal junction. Due to redundancy in the hypopharynx, a stiff Amplatz wire was inserted into the nasogastric tube with successful advancement of the tube into the stomach. IMPRESSION: Successful nasogastric tube placement under fluoroscopy. Electronically Signed   By: Leanna Battles M.D.   On: 07/22/2016 16:16        Scheduled Meds: . calcium-vitamin D  2 tablet Oral Daily  . enoxaparin (LOVENOX) injection  40 mg Subcutaneous QHS  . folic acid  1 mg Oral Daily  . Influenza vac split quadrivalent PF  0.5 mL Intramuscular Tomorrow-1000  . metoprolol  25 mg Oral BID  . piperacillin-tazobactam (ZOSYN)  IV  3.375 g Intravenous Q8H   Continuous Infusions: . dextrose 5 % and 0.9 % NaCl with KCl 20 mEq/L       LOS: 2 days    Time spent: 30 minutes.     Kathlen ModyAKULA,Nat Lowenthal, MD [facility] Pager [phone]   If 7PM-7AM, please contact night-coverage [email or web address] Password [email or web address] 07/23/2016, 1:22 PM

## 2016-07-24 ENCOUNTER — Inpatient Hospital Stay (HOSPITAL_COMMUNITY): Payer: Medicare Other

## 2016-07-24 DIAGNOSIS — K460 Unspecified abdominal hernia with obstruction, without gangrene: Secondary | ICD-10-CM

## 2016-07-24 DIAGNOSIS — D631 Anemia in chronic kidney disease: Secondary | ICD-10-CM

## 2016-07-24 DIAGNOSIS — N183 Chronic kidney disease, stage 3 (moderate): Secondary | ICD-10-CM

## 2016-07-24 DIAGNOSIS — I10 Essential (primary) hypertension: Secondary | ICD-10-CM

## 2016-07-24 DIAGNOSIS — G20 Parkinson's disease: Secondary | ICD-10-CM

## 2016-07-24 DIAGNOSIS — K56609 Unspecified intestinal obstruction, unspecified as to partial versus complete obstruction: Secondary | ICD-10-CM

## 2016-07-24 LAB — BASIC METABOLIC PANEL
ANION GAP: 8 (ref 5–15)
BUN: 28 mg/dL — ABNORMAL HIGH (ref 6–20)
CALCIUM: 8.9 mg/dL (ref 8.9–10.3)
CO2: 25 mmol/L (ref 22–32)
Chloride: 114 mmol/L — ABNORMAL HIGH (ref 101–111)
Creatinine, Ser: 1.42 mg/dL — ABNORMAL HIGH (ref 0.61–1.24)
GFR calc Af Amer: 51 mL/min — ABNORMAL LOW (ref >60)
GFR, EST NON AFRICAN AMERICAN: 44 mL/min — AB (ref >60)
Glucose, Bld: 114 mg/dL — ABNORMAL HIGH (ref 65–99)
POTASSIUM: 3.4 mmol/L — AB (ref 3.5–5.1)
SODIUM: 147 mmol/L — AB (ref 135–145)

## 2016-07-24 LAB — CBC
HEMATOCRIT: 30.8 % — AB (ref 39.0–52.0)
Hemoglobin: 10 g/dL — ABNORMAL LOW (ref 13.0–17.0)
MCH: 29.2 pg (ref 26.0–34.0)
MCHC: 32.5 g/dL (ref 30.0–36.0)
MCV: 90.1 fL (ref 78.0–100.0)
Platelets: 148 10*3/uL — ABNORMAL LOW (ref 150–400)
RBC: 3.42 MIL/uL — ABNORMAL LOW (ref 4.22–5.81)
RDW: 14 % (ref 11.5–15.5)
WBC: 8.9 10*3/uL (ref 4.0–10.5)

## 2016-07-24 MED ORDER — VITAMINS A & D EX OINT
TOPICAL_OINTMENT | CUTANEOUS | Status: AC
  Administered 2016-07-24: 14:00:00
  Filled 2016-07-24: qty 5

## 2016-07-24 MED ORDER — CARBIDOPA-LEVODOPA 25-100 MG PO TABS
2.0000 | ORAL_TABLET | Freq: Three times a day (TID) | ORAL | Status: DC
  Administered 2016-07-24 – 2016-07-25 (×5): 2 via ORAL
  Filled 2016-07-24 (×5): qty 2

## 2016-07-24 NOTE — Progress Notes (Signed)
PROGRESS NOTE    Kevin Roman  WUJ:[id number] DOB: Apr 02, 1930 DOA: 07/21/2016 PCP: No PCP Per Patient    Brief Narrative:Kevin Roman is a 83 y.o. male with medical history significant of parkinson's disease who presents with severe and persistent nausea and vomiting. CT abdomen and pelvis shows There is distension of the stomach with fluid and some air-fluid level. Significant small bowel distension with multiple air-fluid levels. There is a left inguinal scrotal canal hernia measures 6 by 7.5 cm containing moderate distended small bowel lobe. The exiting loop from the hernia axial image 74 is small caliber. Findings are consistent with small bowel obstruction due to incarcerated left inguinal hernia.2 There are indeterminate scattered low-density lesions within liver. The largest in left hepatic lobe anteriorly measures 1.4 cm consistent with a cyst. Given history of prostate cancer follow-up examination nonemergent enhanced MRI is recommended for further evaluation. Surgery consulted and recommendations given. He had  NG Tube in place; but currently clamped and with diet advanced to CLD. Plan for OR on Friday.   Assessment & Plan:   Active Problems:   HTN (hypertension)   Parkinson's disease (HCC)   Hypercholesteremia   Anemia   SBO (small bowel obstruction)   Incarcerated hernia   SBO from incarcerated inguinal hernia; NGT tube clamped, patient move BM overnight and has contrast in his colon, demonstarting not obstruction -will advance to CLD and resume oral meds for now -planning surgical repair of his hernia on 07/26/16  Leukocytosis, afebrile this am, blood cultures done and pending,  -continue with IV zosyn empirically for another day or so; most likely urine in etiology .  -will follow and discontinue abx's as soon as possible if not needed   Anemia: of chronic disease  -stable hemoglobin at 9.6, normocytic.   CKD stage 3: baseline creatinine between 1.3 to 1.5.    -Currently at 1.55.  -will Monitor closely.   Hypokalemia, hypernatremia:  -Due to NGT suctions and poor intake -will continue IVF's with potassium supplementation -CLD permitted today by CCS; will follow tolerance -BMET in am  parkinson's disease:  -now that NG is clamped will resume his carbidopa   Hypertension:  -will resume clonidine PO -continue PRN IV hydralazine.   DVT prophylaxis: (Lovenox) Code Status: (Full/) Family Communication: none at bedside.  Disposition Plan:  Remains inpatient for now; per surgery will clamp NGT and will allow CLD. Potentially hernia repair on 1/26   Consultants:   Surgery.    Procedures: none.    Antimicrobials: IV zosyn.   Subjective: Oriented mainly to person; mild agitation and needing restrains. Had BM overnight.  Objective: Vitals:   07/23/16 2033 07/24/16 0023 07/24/16 0400 07/24/16 1220  BP: (!) 175/79 (!) 162/80 (!) 158/84 (!) 164/61  Pulse: 84 82 82 88  Resp: 19 19 20 18   Temp: 99.3 F (37.4 C) 99.2 F (37.3 C) 99 F (37.2 C) 98.6 F (37 C)  TempSrc: Oral Oral Oral Oral  SpO2: 95% 96% 98% 98%  Weight:      Height:        Intake/Output Summary (Last 24 hours) at 07/24/16 1631 Last data filed at 07/24/16 1345  Gross per 24 hour  Intake           1352.5 ml  Output             1950 ml  Net           -597.5 ml   Filed Weights   07/22/16 1215  Weight: 84 kg (185 lb 3 oz)    Examination:  General exam: Afebrile, agitated overnight and very confused today. Also with increase shaking activity. NG tube in place. Per nursing staff had BM last night. Respiratory system: Clear to auscultation. Respiratory effort normal. Cardiovascular system: S1 & S2 heard, RRR. No JVD, murmurs, rubs, gallops or clicks. No pedal edema. Gastrointestinal system: Abdomen is nondistended, soft non tender. BS are present but reduced. Central nervous system: Alert ,but confused.  Extremities: Symmetric 5 x 5 power. Skin: No rashes,  lesions or ulcers  Data Reviewed: I have personally reviewed following labs and imaging studies  CBC:  Recent Labs Lab 07/21/16 1150 07/22/16 0640 07/23/16 0555 07/24/16 0605  WBC 6.3 12.3* 8.6 8.9  NEUTROABS 5.6  --   --   --   HGB 11.1* 10.2* 9.6* 10.0*  HCT 33.8* 30.7* 30.3* 30.8*  MCV 88.5 88.7 90.4 90.1  PLT 170 123* 134* 148*   Basic Metabolic Panel:  Recent Labs Lab 07/21/16 1150 07/22/16 0640 07/23/16 0555 07/24/16 0605  NA 141 142 146* 147*  K 3.7 4.2 3.4* 3.4*  CL 105 108 111 114*  CO2 26 23 27 25   GLUCOSE 132* 157* 108* 114*  BUN 30* 40* 36* 28*  CREATININE 1.28* 1.55* 1.50* 1.42*  CALCIUM 9.6 9.0 8.9 8.9  MG  --   --  2.0  --    GFR: Estimated Creatinine Clearance: 40.9 mL/min (by C-G formula based on SCr of 1.42 mg/dL (H)).   Liver Function Tests:  Recent Labs Lab 07/21/16 1150 07/22/16 0640  AST 19 24  ALT 11* 22  ALKPHOS 71 51  BILITOT 0.6 0.8  PROT 7.8 6.8  ALBUMIN 4.5 3.8   CBG:  Recent Labs Lab 07/22/16 1644  GLUCAP 101*   Sepsis Labs:  Recent Labs Lab 07/21/16 1605  LATICACIDVEN 0.84    Recent Results (from the past 240 hour(s))  Culture, blood (Routine X 2) w Reflex to ID Panel     Status: None (Preliminary result)   Collection Time: 07/22/16 10:44 AM  Result Value Ref Range Status   Specimen Description BLOOD LEFT ARM  Final   Special Requests BOTTLES DRAWN AEROBIC AND ANAEROBIC 7CC  Final   Culture   Final    NO GROWTH 2 DAYS Performed at [facility], [address] [place], [place] [address]    Report Status PENDING  Incomplete  Culture, blood (Routine X 2) w Reflex to ID Panel     Status: None (Preliminary result)   Collection Time: 07/22/16 10:44 AM  Result Value Ref Range Status   Specimen Description BLOOD LEFT HAND  Final   Special Requests BOTTLES DRAWN AEROBIC AND ANAEROBIC 10CC  Final   Culture   Final    NO GROWTH 2 DAYS Performed at [facility], [address] [place], [place]  [address]    Report Status PENDING  Incomplete      Radiology Studies: Dg Abd 1 View  Result Date: 07/24/2016 CLINICAL DATA:  Small bowel obstruction, followup EXAM: ABDOMEN - 1 VIEW COMPARISON:  Portable exam 0842 hours compared 07/23/2016 FINDINGS: Nasogastric tube coiled in proximal stomach. Retained contrast present in colon from mid transverse to sigmoid colon. Small bowel gas pattern appears normal. Surgical clips and phleboliths in pelvis. No definite bowel wall thickening or acute osseous findings. IMPRESSION: Retained contrast in colon without small bowel dilatation. Electronically Signed   By: Ulyses SouthwardMark  Boles M.D.   On:  07/24/2016 09:14   Dg Abd Portable 1v-small Bowel Obstruction Protocol-initial, 8 Hr Delay  Result Date: 07/23/2016 CLINICAL DATA:  8 hours status post Gastrografin administration for small bowel obstruction workup. Initial encounter. EXAM: PORTABLE ABDOMEN - 1 VIEW COMPARISON:  Abdominal radiograph performed earlier today at 8:33 a.m. FINDINGS: Contrast has progressed into the colon. Previously noted distended small bowel loops are less apparent, likely reflecting mild dysmotility. There is no evidence of bowel obstruction at this time. The patient's enteric tube is noted ending overlying the fundus of the stomach. No acute osseous abnormalities are seen. Mild degenerative change is noted at the lower lumbar spine. Postoperative change is noted at the pelvis. IMPRESSION: Contrast has progressed into the colon. Previously noted distended small bowel loops are less apparent, likely reflecting mild dysmotility. No evidence of bowel obstruction at this time. No additional imaging is required per small bowel obstruction protocol. Electronically Signed   By: Roanna Raider M.D.   On: 07/23/2016 20:06   Dg Abd Portable 1v-small Bowel Protocol-position Verification  Result Date: 07/23/2016 CLINICAL DATA:  NG tube . EXAM: PORTABLE ABDOMEN - 1 VIEW COMPARISON:  07/23/2016. FINDINGS: NG tube  noted coiled in the stomach. Distended loops of small bowel are again noted. Distention has improved from prior exam. Stool in the colon. No free air . No acute bony abnormalities. IMPRESSION: NG tube noted coiled stomach. Distended loops of small bowel are again noted. Distention has improved from prior exam . Colon is nondistended. Stool noted in the colon. No free intraperitoneal air. Electronically Signed   By: Maisie Fus  Register   On: 07/23/2016 09:01   Dg Abd Portable 1v  Result Date: 07/23/2016 CLINICAL DATA:  Small bowel obstruction EXAM: PORTABLE ABDOMEN - 1 VIEW COMPARISON:  07/22/2016 FINDINGS: Stable NG tube. Disproportionally dilated loops of small bowel are improved. Colonic gas has increased. Postop changes over the pelvis. No obvious free intraperitoneal gas. IMPRESSION: Improving partial small bowel obstruction pattern. Electronically Signed   By: Jolaine Click M.D.   On: 07/23/2016 06:59    Scheduled Meds: . calcium-vitamin D  2 tablet Oral Daily  . carbidopa-levodopa  2 tablet Oral TID  . enoxaparin (LOVENOX) injection  40 mg Subcutaneous QHS  . folic acid  1 mg Oral Daily  . Influenza vac split quadrivalent PF  0.5 mL Intramuscular Tomorrow-1000  . metoprolol  25 mg Oral BID  . piperacillin-tazobactam (ZOSYN)  IV  3.375 g Intravenous Q8H   Continuous Infusions: . dextrose 5 % and 0.9 % NaCl with KCl 20 mEq/L 75 mL/hr at 07/24/16 1411     LOS: 3 days    Time spent: 25 minutes.     Vassie Loll, MD [facility] Pager [phone]   If 7PM-7AM, please contact night-coverage [email or web address] Password [facility] 07/24/2016, 4:31 PM

## 2016-07-24 NOTE — Progress Notes (Signed)
Subjective: He is confused this AM and in restraints again, he wants to pull NG out.  His granddaughter is here and I also talked with his son on the phone.  Contrast is in the colon.  He is having BM.  Objective: Vital signs in last 24 hours: Temp:  [98.2 F (36.8 C)-99.3 F (37.4 C)] 99 F (37.2 C) (01/24 0400) Pulse Rate:  [79-93] 82 (01/24 0400) Resp:  [18-20] 20 (01/24 0400) BP: (158-188)/(76-85) 158/84 (01/24 0400) SpO2:  [95 %-98 %] 98 % (01/24 0400) Last BM Date: 07/21/16 850 NG 1300 IV 1500urine BM x 2 Afebrile, VSS, BP still up some Na 147, K+ 3.4 Creatinine is 1.42 Films show contrast to the colon   Intake/Output from previous day: 01/23 0701 - 01/24 0700 In: 1387.5 [I.V.:1237.5; IV Piggyback:150] Out: 2350 [Urine:1500; Emesis/NG output:850] Intake/Output this shift: No intake/output data recorded.  General appearance: alert, cooperative, confused, in restraints, but no distress. He cannot tell me where he is today, but he is talking.   GI: soft, non-tender; bowel sounds normal; no masses,  no organomegaly  Lab Results:   Recent Labs  07/23/16 0555 07/24/16 0605  WBC 8.6 8.9  HGB 9.6* 10.0*  HCT 30.3* 30.8*  PLT 134* 148*    BMET  Recent Labs  07/23/16 0555 07/24/16 0605  NA 146* 147*  K 3.4* 3.4*  CL 111 114*  CO2 27 25  GLUCOSE 108* 114*  BUN 36* 28*  CREATININE 1.50* 1.42*  CALCIUM 8.9 8.9   PT/INR No results for input(s): LABPROT, INR in the last 72 hours.   Recent Labs Lab 07/21/16 1150 07/22/16 0640  AST 19 24  ALT 11* 22  ALKPHOS 71 51  BILITOT 0.6 0.8  PROT 7.8 6.8  ALBUMIN 4.5 3.8     Lipase  No results found for: LIPASE   Studies/Results: Dg Abd 1 View  Result Date: 07/24/2016 CLINICAL DATA:  Small bowel obstruction, followup EXAM: ABDOMEN - 1 VIEW COMPARISON:  Portable exam 0842 hours compared 07/23/2016 FINDINGS: Nasogastric tube coiled in proximal stomach. Retained contrast present in colon from mid  transverse to sigmoid colon. Small bowel gas pattern appears normal. Surgical clips and phleboliths in pelvis. No definite bowel wall thickening or acute osseous findings. IMPRESSION: Retained contrast in colon without small bowel dilatation. Electronically Signed   By: Ulyses SouthwardMark  Boles M.D.   On: 07/24/2016 09:14   Dg Abd Portable 1v-small Bowel Obstruction Protocol-initial, 8 Hr Delay  Result Date: 07/23/2016 CLINICAL DATA:  8 hours status post Gastrografin administration for small bowel obstruction workup. Initial encounter. EXAM: PORTABLE ABDOMEN - 1 VIEW COMPARISON:  Abdominal radiograph performed earlier today at 8:33 a.m. FINDINGS: Contrast has progressed into the colon. Previously noted distended small bowel loops are less apparent, likely reflecting mild dysmotility. There is no evidence of bowel obstruction at this time. The patient's enteric tube is noted ending overlying the fundus of the stomach. No acute osseous abnormalities are seen. Mild degenerative change is noted at the lower lumbar spine. Postoperative change is noted at the pelvis. IMPRESSION: Contrast has progressed into the colon. Previously noted distended small bowel loops are less apparent, likely reflecting mild dysmotility. No evidence of bowel obstruction at this time. No additional imaging is required per small bowel obstruction protocol. Electronically Signed   By: Roanna RaiderJeffery  Chang M.D.   On: 07/23/2016 20:06   Dg Abd Portable 1v-small Bowel Protocol-position Verification  Result Date: 07/23/2016 CLINICAL DATA:  NG tube . EXAM: PORTABLE ABDOMEN -  1 VIEW COMPARISON:  07/23/2016. FINDINGS: NG tube noted coiled in the stomach. Distended loops of small bowel are again noted. Distention has improved from prior exam. Stool in the colon. No free air . No acute bony abnormalities. IMPRESSION: NG tube noted coiled stomach. Distended loops of small bowel are again noted. Distention has improved from prior exam . Colon is nondistended. Stool  noted in the colon. No free intraperitoneal air. Electronically Signed   By: Maisie Fus  Register   On: 07/23/2016 09:01   Dg Abd Portable 1v  Result Date: 07/23/2016 CLINICAL DATA:  Small bowel obstruction EXAM: PORTABLE ABDOMEN - 1 VIEW COMPARISON:  07/22/2016 FINDINGS: Stable NG tube. Disproportionally dilated loops of small bowel are improved. Colonic gas has increased. Postop changes over the pelvis. No obvious free intraperitoneal gas. IMPRESSION: Improving partial small bowel obstruction pattern. Electronically Signed   By: Jolaine Click M.D.   On: 07/23/2016 06:59   Dg Vangie Bicker G Tube Plc W/fl W/rad  Result Date: 07/22/2016 CLINICAL DATA:  Nasogastric tube placement under fluoroscopy. Unsuccessful attempts on the for. EXAM: NASO G TUBE PLACEMENT WITH FL AND WITH RAD CONTRAST:  None. FLUOROSCOPY TIME:  Fluoroscopy Time:  1 minutes 28 seconds. Radiation Exposure Index (if provided by the fluoroscopic device): 18.2 mGy Number of Acquired Spot Images: 0 COMPARISON:  None. FINDINGS: Nasogastric tube was advanced through the right nostril to just beyond the gastroesophageal junction. Due to redundancy in the hypopharynx, a stiff Amplatz wire was inserted into the nasogastric tube with successful advancement of the tube into the stomach. IMPRESSION: Successful nasogastric tube placement under fluoroscopy. Electronically Signed   By: Leanna Battles M.D.   On: 07/22/2016 16:16    Medications: . calcium-vitamin D  2 tablet Oral Daily  . enoxaparin (LOVENOX) injection  40 mg Subcutaneous QHS  . folic acid  1 mg Oral Daily  . Influenza vac split quadrivalent PF  0.5 mL Intramuscular Tomorrow-1000  . metoprolol  25 mg Oral BID  . piperacillin-tazobactam (ZOSYN)  IV  3.375 g Intravenous Q8H   . dextrose 5 % and 0.9 % NaCl with KCl 20 mEq/L 75 mL/hr at 07/24/16 0546    Assessment/Plan SBO with reducible left inguinal hernia/hx of prostate surgery Abnormal liver CT/L>R hydrocele Hypokalemia  K+ 3.4 -  checking mag will add some to IV fluids Parkinson disease - some confusion 1/21-22/18, better this AM Hypertension Acute renal disease FEVER with possible UTI FEN: IV fluids/NPO DVT:  Lovenox ID: [email or web address] 07/21/16 =>> day 4   Plan:  I am going to clamp NG, give him some clears, ask PT to get him up if possible and walk him some.  We would like to examine hernia with him up but unable to so far.  I would pull his NG if it were not so hard to replace.  Nursing knows to have him up in chair or bed for all PO's.  I talked with the family about the risk and benefits of surgery.  With him I think the anesthesia is the biggest risk.  He is easily confused now.  Anesthesia may make it worse, at least for a time.  His granddaughter says he is normally not confused.  Very tight and involved family.  Will let the family talk and keep him NPO for surgery tomorrow if the family wants to go forward.      LOS: 3 days    Beva Remund 07/24/2016 [phone]

## 2016-07-25 DIAGNOSIS — K4031 Unilateral inguinal hernia, with obstruction, without gangrene, recurrent: Secondary | ICD-10-CM

## 2016-07-25 MED ORDER — HYDRALAZINE HCL 20 MG/ML IJ SOLN: 10.0000 mg | Freq: Four times a day (QID) | INTRAMUSCULAR | Status: DC | PRN

## 2016-07-25 MED ORDER — CLONIDINE HCL 0.1 MG/24HR TD PTWK
0.1000 mg | MEDICATED_PATCH | TRANSDERMAL | Status: DC
  Administered 2016-07-25: 0.1 mg via TRANSDERMAL
  Filled 2016-07-25: qty 1

## 2016-07-25 NOTE — Care Management Important Message (Signed)
Important Message  Patient Details  Name: Kevin Roman MRN: [id number] Date of Birth: 01/12/1930   Medicare Important Message Given:  Yes    Caren MacadamFuller, Chauntae Hults 07/25/2016, 12:09 PMImportant Message  Patient Details  Name: Kevin Roman MRN: [id number] Date of Birth: 01/12/1930   Medicare Important Message Given:  Yes    Caren MacadamFuller, Atleigh Gruen 07/25/2016, 12:09 PM

## 2016-07-25 NOTE — Progress Notes (Addendum)
PROGRESS NOTE    Kevin Roman  RUE:[id number] DOB: [birth year] DOA: 07/21/2016 PCP: No PCP Per Patient    Brief Narrative:Kevin Roman is a 83 y.o. male with medical history significant of parkinson's disease who presents with severe and persistent nausea and vomiting. CT abdomen and pelvis shows There is distension of the stomach with fluid and some air-fluid level. Significant small bowel distension with multiple air-fluid levels. There is a left inguinal scrotal canal hernia measures 6 by 7.5 cm containing moderate distended small bowel lobe. The exiting loop from the hernia axial image 74 is small caliber. Findings are consistent with small bowel obstruction due to incarcerated left inguinal hernia.2 There are indeterminate scattered low-density lesions within liver. The largest in left hepatic lobe anteriorly measures 1.4 cm consistent with a cyst. Given history of prostate cancer follow-up examination nonemergent enhanced MRI is recommended for further evaluation. Surgery consulted and recommendations given. He had  NG Tube in place; but currently clamped and with diet advanced to CLD. Plan for OR on Friday.   Assessment & Plan:   Active Problems:   HTN (hypertension)   Parkinson's disease (HCC)   Hypercholesteremia   Anemia   SBO (small bowel obstruction)   Incarcerated hernia   SBO from incarcerated inguinal hernia; NGT tube clamped, patient move BM overnight and has contrast in his colon, demonstarting not obstruction -continue CLD and oral meds for now; NPO after midnight  -planning surgical repair of his hernia on 07/26/16 -will d/c NGT  Leukocytosis, afebrile this am, blood cultures done and pending,  -continue with IV zosyn empirically for another day or so; most likely urine in etiology .  -will follow and discontinue abx's as soon as possible if not needed  -will check UA and urine cx, if remains stable will d/c abx's after today/s dosages  Anemia: of chronic disease    -stable hemoglobin at 10.0, normocytic.   CKD stage 3: baseline creatinine between 1.3 to 1.5.  -Currently at 1.42.  -will Monitor closely.   Hypokalemia, hypernatremia:  -Due to NGT suctions and poor intake -will continue IVF's with potassium supplementation -clear liquid diet until midnight today as per CCS rec's -will follow electrolytes trend   parkinson's disease:  -continue carbidopa   Hypertension:  -will resume clonidine transdermal today -continue PRN IV hydralazine.   Agitation/delirium -stable and much improved -will d/c restrains   DVT prophylaxis: (Lovenox) Code Status: (Full/) Family Communication: none at bedside.  Disposition Plan:  Remains inpatient for now; per surgery will clamp NGT and will allow CLD. Potentially hernia repair on 1/26   Consultants:   Surgery.    Procedures: none.    Antimicrobials: IV zosyn 1/21>>>   Subjective: Oriented to person and place; calm and no presenting agitation currently. Patient following commands.   Objective: Vitals:   07/24/16 1605 07/24/16 2047 07/25/16 0510 07/25/16 1006  BP: (!) 149/70 (!) 143/79 (!) 160/74 (!) 174/102  Pulse: 85 78 80 78  Resp: 18 19 19 18   Temp: 99.2 F (37.3 C) 100 F (37.8 C) 98.9 F (37.2 C) 98.7 F (37.1 C)  TempSrc: Oral Oral Oral Oral  SpO2: 97% 97% 96% 95%  Weight:      Height:        Intake/Output Summary (Last 24 hours) at 07/25/16 1115 Last data filed at 07/25/16 0900  Gross per 24 hour  Intake             2050 ml  Output  300 ml  Net             1750 ml   Filed Weights   07/22/16 1215  Weight: 84 kg (185 lb 3 oz)    Examination:  General exam: Afebrile, oriented X2 and in no distress. Patient even has continue requiring restrains is lees shaky today and following commands. No nausea, no vomiting and denies abd pain. NG tube in place.  Respiratory system: Clear to auscultation. Respiratory effort normal. Cardiovascular system: S1 & S2 heard,  RRR. No JVD, murmurs, rubs, gallops or clicks. No pedal edema. Gastrointestinal system: Abdomen is nondistended, soft non tender. BS are present but reduced. Central nervous system: Alert ,but confused.  Extremities: Symmetric 5 x 5 power. Skin: No rashes, lesions or ulcers  Data Reviewed: I have personally reviewed following labs and imaging studies  CBC:  Recent Labs Lab 07/21/16 1150 07/22/16 0640 07/23/16 0555 07/24/16 0605  WBC 6.3 12.3* 8.6 8.9  NEUTROABS 5.6  --   --   --   HGB 11.1* 10.2* 9.6* 10.0*  HCT 33.8* 30.7* 30.3* 30.8*  MCV 88.5 88.7 90.4 90.1  PLT 170 123* 134* 148*   Basic Metabolic Panel:  Recent Labs Lab 07/21/16 1150 07/22/16 0640 07/23/16 0555 07/24/16 0605  NA 141 142 146* 147*  K 3.7 4.2 3.4* 3.4*  CL 105 108 111 114*  CO2 26 23 27 25   GLUCOSE 132* 157* 108* 114*  BUN 30* 40* 36* 28*  CREATININE 1.28* 1.55* 1.50* 1.42*  CALCIUM 9.6 9.0 8.9 8.9  MG  --   --  2.0  --    GFR: Estimated Creatinine Clearance: 40.9 mL/min (by C-G formula based on SCr of 1.42 mg/dL (H)).   Liver Function Tests:  Recent Labs Lab 07/21/16 1150 07/22/16 0640  AST 19 24  ALT 11* 22  ALKPHOS 71 51  BILITOT 0.6 0.8  PROT 7.8 6.8  ALBUMIN 4.5 3.8   CBG:  Recent Labs Lab 07/22/16 1644  GLUCAP 101*   Sepsis Labs:  Recent Labs Lab 07/21/16 1605  LATICACIDVEN 0.84    Recent Results (from the past 240 hour(s))  Culture, blood (Routine X 2) w Reflex to ID Panel     Status: None (Preliminary result)   Collection Time: 07/22/16 10:44 AM  Result Value Ref Range Status   Specimen Description BLOOD LEFT ARM  Final   Special Requests BOTTLES DRAWN AEROBIC AND ANAEROBIC 7CC  Final   Culture   Final    NO GROWTH 2 DAYS Performed at [facility], [address] [place], [place] [address]    Report Status PENDING  Incomplete  Culture, blood (Routine X 2) w Reflex to ID Panel     Status: None (Preliminary result)   Collection Time: 07/22/16 10:44 AM   Result Value Ref Range Status   Specimen Description BLOOD LEFT HAND  Final   Special Requests BOTTLES DRAWN AEROBIC AND ANAEROBIC 10CC  Final   Culture   Final    NO GROWTH 2 DAYS Performed at [facility], [address], [place], [place] [address]    Report Status PENDING  Incomplete      Radiology Studies: Dg Abd 1 View  Result Date: 07/24/2016 CLINICAL DATA:  Small bowel obstruction, followup EXAM: ABDOMEN - 1 VIEW COMPARISON:  Portable exam 0842 hours compared 07/23/2016 FINDINGS: Nasogastric tube coiled in proximal stomach. Retained contrast present in colon from mid transverse to sigmoid colon. Small bowel gas pattern appears  normal. Surgical clips and phleboliths in pelvis. No definite bowel wall thickening or acute osseous findings. IMPRESSION: Retained contrast in colon without small bowel dilatation. Electronically Signed   By: Ulyses SouthwardMark  Boles M.D.   On: 07/24/2016 09:14   Dg Abd Portable 1v-small Bowel Obstruction Protocol-initial, 8 Hr Delay  Result Date: 07/23/2016 CLINICAL DATA:  8 hours status post Gastrografin administration for small bowel obstruction workup. Initial encounter. EXAM: PORTABLE ABDOMEN - 1 VIEW COMPARISON:  Abdominal radiograph performed earlier today at 8:33 a.m. FINDINGS: Contrast has progressed into the colon. Previously noted distended small bowel loops are less apparent, likely reflecting mild dysmotility. There is no evidence of bowel obstruction at this time. The patient's enteric tube is noted ending overlying the fundus of the stomach. No acute osseous abnormalities are seen. Mild degenerative change is noted at the lower lumbar spine. Postoperative change is noted at the pelvis. IMPRESSION: Contrast has progressed into the colon. Previously noted distended small bowel loops are less apparent, likely reflecting mild dysmotility. No evidence of bowel obstruction at this time. No additional imaging is required per small bowel obstruction protocol.  Electronically Signed   By: Roanna RaiderJeffery  Chang M.D.   On: 07/23/2016 20:06    Scheduled Meds: . calcium-vitamin D  2 tablet Oral Daily  . carbidopa-levodopa  2 tablet Oral TID  . cloNIDine  0.1 mg Transdermal Weekly  . enoxaparin (LOVENOX) injection  40 mg Subcutaneous QHS  . folic acid  1 mg Oral Daily  . Influenza vac split quadrivalent PF  0.5 mL Intramuscular Tomorrow-1000  . metoprolol  25 mg Oral BID  . piperacillin-tazobactam (ZOSYN)  IV  3.375 g Intravenous Q8H   Continuous Infusions: . dextrose 5 % and 0.9 % NaCl with KCl 20 mEq/L 75 mL/hr at 07/24/16 2221     LOS: 4 days    Time spent: 25 minutes.     Vassie LollMadera, Anakaren Campion, MD [facility] Pager [phone]   If 7PM-7AM, please contact night-coverage [email or web address] Password [email or web address] 07/25/2016, 11:15 AM

## 2016-07-25 NOTE — Progress Notes (Signed)
Pharmacy Antibiotic Note  Kevin Roman is a 83 y.o. male admitted on 07/21/2016 with intra-abdominal infection.  patient with incarcerated hernia. Pharmacy has been consulted for zosyn dosing. Given zosyn x 1 in ED 1/21 pm but not resumed as no signs of infection.  Resumed 1/22 for fevers and mildly elevated WBC.  Today, 07/25/2016  WBC resolved, last fevers 1/22  SCr elevated at baseline, stable  Plan:  Continue Zosyn 3.375gm IV given q8h over 4h infusion   Given resolution of fever and leukocytosis x 48 hr, consider stopping antibiotics today if patient clinically appears improved  Height: 5\' 8"  (172.7 cm) Weight: 185 lb 3 oz (84 kg) (per pt reports ) IBW/kg (Calculated) : 68.4  Temp (24hrs), Avg:99.1 F (37.3 C), Min:98.6 F (37 C), Max:100 F (37.8 C)   Recent Labs Lab 07/21/16 1150 07/21/16 1605 07/22/16 0640 07/23/16 0555 07/24/16 0605  WBC 6.3  --  12.3* 8.6 8.9  CREATININE 1.28*  --  1.55* 1.50* 1.42*  LATICACIDVEN  --  0.84  --   --   --     Estimated Creatinine Clearance: 40.9 mL/min (by C-G formula based on SCr of 1.42 mg/dL (H)).    No Known Allergies  Antimicrobials this admission: 1/22 >> zosyn    Dose adjustments this admission: ---   Microbiology results: 1/22 BCx: ng2d  Thank you for allowing pharmacy to be a part of this patient's care.  Bernadene Personrew Miia Blanks, PharmD, BCPS Pager: [phone] 07/25/2016, 11:10 AM

## 2016-07-25 NOTE — Progress Notes (Signed)
Subjective: He is a little confused but answering questions.  He has restraints but it is for his NG.  Nurse says he had a BM last PM and just now.  Abdomen is not distended and he is not tender.    Objective: Vital signs in last 24 hours: Temp:  [98.6 F (37 C)-100 F (37.8 C)] 98.7 F (37.1 C) (01/25 1006) Pulse Rate:  [78-88] 78 (01/25 1006) Resp:  [18-19] 18 (01/25 1006) BP: (143-174)/(61-102) 174/102 (01/25 1006) SpO2:  [95 %-98 %] 95 % (01/25 1006) Last BM Date: 07/24/16 25 PO recorded 300 urine Stool - none recorded Afebrile, BP up K+ still low  Intake/Output from previous day: 01/24 0701 - 01/25 0700 In: 2050 [P.O.:25; I.V.:1875; IV Piggyback:150] Out: 300 [Urine:300] Intake/Output this shift: No intake/output data recorded.  General appearance: alert, cooperative, no distress and still a little confused.   GI: soft, non-tender; bowel sounds normal; no masses,  no organomegaly  Lab Results:   Recent Labs  07/23/16 0555 07/24/16 0605  WBC 8.6 8.9  HGB 9.6* 10.0*  HCT 30.3* 30.8*  PLT 134* 148*    BMET  Recent Labs  07/23/16 0555 07/24/16 0605  NA 146* 147*  K 3.4* 3.4*  CL 111 114*  CO2 27 25  GLUCOSE 108* 114*  BUN 36* 28*  CREATININE 1.50* 1.42*  CALCIUM 8.9 8.9   PT/INR No results for input(s): LABPROT, INR in the last 72 hours.   Recent Labs Lab 07/21/16 1150 07/22/16 0640  AST 19 24  ALT 11* 22  ALKPHOS 71 51  BILITOT 0.6 0.8  PROT 7.8 6.8  ALBUMIN 4.5 3.8     Lipase  No results found for: LIPASE   Studies/Results: Dg Abd 1 View  Result Date: 07/24/2016 CLINICAL DATA:  Small bowel obstruction, followup EXAM: ABDOMEN - 1 VIEW COMPARISON:  Portable exam 0842 hours compared 07/23/2016 FINDINGS: Nasogastric tube coiled in proximal stomach. Retained contrast present in colon from mid transverse to sigmoid colon. Small bowel gas pattern appears normal. Surgical clips and phleboliths in pelvis. No definite bowel wall thickening or  acute osseous findings. IMPRESSION: Retained contrast in colon without small bowel dilatation. Electronically Signed   By: Ulyses SouthwardMark  Boles M.D.   On: 07/24/2016 09:14   Dg Abd Portable 1v-small Bowel Obstruction Protocol-initial, 8 Hr Delay  Result Date: 07/23/2016 CLINICAL DATA:  8 hours status post Gastrografin administration for small bowel obstruction workup. Initial encounter. EXAM: PORTABLE ABDOMEN - 1 VIEW COMPARISON:  Abdominal radiograph performed earlier today at 8:33 a.m. FINDINGS: Contrast has progressed into the colon. Previously noted distended small bowel loops are less apparent, likely reflecting mild dysmotility. There is no evidence of bowel obstruction at this time. The patient's enteric tube is noted ending overlying the fundus of the stomach. No acute osseous abnormalities are seen. Mild degenerative change is noted at the lower lumbar spine. Postoperative change is noted at the pelvis. IMPRESSION: Contrast has progressed into the colon. Previously noted distended small bowel loops are less apparent, likely reflecting mild dysmotility. No evidence of bowel obstruction at this time. No additional imaging is required per small bowel obstruction protocol. Electronically Signed   By: Roanna RaiderJeffery  Chang M.D.   On: 07/23/2016 20:06    Medications: . calcium-vitamin D  2 tablet Oral Daily  . carbidopa-levodopa  2 tablet Oral TID  . cloNIDine  0.1 mg Transdermal Weekly  . enoxaparin (LOVENOX) injection  40 mg Subcutaneous QHS  . folic acid  1 mg Oral Daily  .  Influenza vac split quadrivalent PF  0.5 mL Intramuscular Tomorrow-1000  . metoprolol  25 mg Oral BID  . piperacillin-tazobactam (ZOSYN)  IV  3.375 g Intravenous Q8H    Assessment/Plan SBO with reducible left inguinal hernia/hx of prostate surgery Abnormal liver CT/L>R hydrocele Hypokalemia K+ 3.4 - checking mag will add some to IV fluids Parkinson disease - some confusion 1/21-22/18, better this AM Hypertension Acute renal  disease FEVER with possible UTI FEN: IV fluids/clears DVT:  Lovenox ID: Zosyn 07/21/16 =>> day 5   Plan:  Pull NG,  Clears today, and plan [facility] repair tomorrow by Dr. Daphine Deutscher. He is back on PO meds also.     LOS: 4 days    Halona Amstutz 07/25/2016 [phone]

## 2016-07-25 NOTE — Progress Notes (Signed)
PT Cancellation Note  Patient Details Name: Kevin Roman MRN: [id number] DOB: Nov 12, 1929   Cancelled Treatment:    Reason Eval/Treat Not Completed: Medical issues which prohibited therapy (patient to go to surgery for hernia repair. Please reorder post op. )   Rada HayHill, Kevin Roman 07/25/2016, 1:18 PM Blanchard KelchKaren Resa Rinks PT [phone]

## 2016-07-26 ENCOUNTER — Inpatient Hospital Stay (HOSPITAL_COMMUNITY): Payer: Medicare Other | Admitting: Anesthesiology

## 2016-07-26 ENCOUNTER — Encounter (HOSPITAL_COMMUNITY)
Admission: EM | Disposition: A | Discharge status: Skilled Nursing Facility | Payer: Self-pay | Source: Home / Self Care | Attending: Internal Medicine

## 2016-07-26 HISTORY — PX: INGUINAL HERNIA REPAIR: SHX194

## 2016-07-26 LAB — TYPE AND SCREEN
ABO/RH(D): B POS
Antibody Screen: NEGATIVE

## 2016-07-26 SURGERY — REPAIR, HERNIA, INGUINAL, ADULT
Anesthesia: General | Laterality: Left

## 2016-07-26 MED ORDER — ROCURONIUM BROMIDE 50 MG/5ML IV SOSY
PREFILLED_SYRINGE | INTRAVENOUS | Status: DC | PRN
  Administered 2016-07-26 (×4): 10 mg via INTRAVENOUS
  Administered 2016-07-26: 20 mg via INTRAVENOUS

## 2016-07-26 MED ORDER — KCL IN DEXTROSE-NACL 20-5-0.45 MEQ/L-%-% IV SOLN
INTRAVENOUS | Status: DC
  Administered 2016-07-27 – 2016-07-28 (×2): via INTRAVENOUS
  Filled 2016-07-26 (×2): qty 1000

## 2016-07-26 MED ORDER — ROCURONIUM BROMIDE 50 MG/5ML IV SOSY: PREFILLED_SYRINGE | INTRAVENOUS | Status: DC | PRN

## 2016-07-26 MED ORDER — BUPIVACAINE LIPOSOME 1.3 % IJ SUSP
20.0000 mL | Freq: Once | INTRAMUSCULAR | Status: AC
  Administered 2016-07-26: 20 mL
  Filled 2016-07-26: qty 20

## 2016-07-26 MED ORDER — LACTATED RINGERS IV SOLN
INTRAVENOUS | Status: DC | PRN
  Administered 2016-07-26: 07:00:00 via INTRAVENOUS
  Administered 2016-07-26: 10:00:00

## 2016-07-26 MED ORDER — FENTANYL CITRATE (PF) 100 MCG/2ML IJ SOLN
INTRAMUSCULAR | Status: DC | PRN
  Administered 2016-07-26: 50 ug via INTRAVENOUS
  Administered 2016-07-26 (×2): 25 ug via INTRAVENOUS

## 2016-07-26 MED ORDER — POLYVINYL ALCOHOL 1.4 % OP SOLN: 1.0000 [drp] | Freq: Three times a day (TID) | OPHTHALMIC | Status: DC | PRN

## 2016-07-26 MED ORDER — HEPARIN SODIUM (PORCINE) 5000 UNIT/ML IJ SOLN
5000.0000 [IU] | Freq: Three times a day (TID) | INTRAMUSCULAR | Status: DC
  Administered 2016-07-26 – 2016-07-29 (×10): 5000 [IU] via SUBCUTANEOUS
  Filled 2016-07-26 (×8): qty 1

## 2016-07-26 MED ORDER — HYDRALAZINE HCL 20 MG/ML IJ SOLN: 10.0000 mg | Freq: Four times a day (QID) | INTRAMUSCULAR | Status: DC | PRN

## 2016-07-26 MED ORDER — SODIUM CHLORIDE 0.9 % IJ SOLN
INTRAMUSCULAR | Status: AC
  Filled 2016-07-26: qty 50

## 2016-07-26 MED ORDER — FENTANYL CITRATE (PF) 100 MCG/2ML IJ SOLN: 25.0000 ug | INTRAMUSCULAR | Status: DC | PRN

## 2016-07-26 MED ORDER — FOLIC ACID 1 MG PO TABS
1.0000 mg | ORAL_TABLET | Freq: Every day | ORAL | Status: DC
  Administered 2016-07-27 – 2016-07-29 (×3): 1 mg via ORAL
  Filled 2016-07-26 (×3): qty 1

## 2016-07-26 MED ORDER — PIPERACILLIN-TAZOBACTAM 3.375 G IVPB
3.3750 g | Freq: Once | INTRAVENOUS | Status: DC
  Administered 2016-07-26: 3.375 g via INTRAVENOUS
  Filled 2016-07-26: qty 50

## 2016-07-26 MED ORDER — MORPHINE SULFATE (PF) 2 MG/ML IV SOLN: 1.0000 mg | INTRAVENOUS | Status: DC | PRN

## 2016-07-26 MED ORDER — ALBUMIN HUMAN 5 % IV SOLN
INTRAVENOUS | Status: DC | PRN
  Administered 2016-07-26 (×2): via INTRAVENOUS

## 2016-07-26 MED ORDER — ROCURONIUM BROMIDE 50 MG/5ML IV SOSY
PREFILLED_SYRINGE | INTRAVENOUS | Status: AC
  Filled 2016-07-26: qty 5

## 2016-07-26 MED ORDER — ALBUMIN HUMAN 5 % IV SOLN
INTRAVENOUS | Status: AC
  Filled 2016-07-26: qty 250

## 2016-07-26 MED ORDER — SUCCINYLCHOLINE CHLORIDE 200 MG/10ML IV SOSY
PREFILLED_SYRINGE | INTRAVENOUS | Status: AC
  Filled 2016-07-26: qty 10

## 2016-07-26 MED ORDER — PANTOPRAZOLE SODIUM 40 MG IV SOLR
40.0000 mg | Freq: Every day | INTRAVENOUS | Status: DC
  Administered 2016-07-26 – 2016-07-28 (×3): 40 mg via INTRAVENOUS
  Filled 2016-07-26 (×3): qty 40

## 2016-07-26 MED ORDER — ONDANSETRON HCL 4 MG/2ML IJ SOLN
INTRAMUSCULAR | Status: AC
  Filled 2016-07-26: qty 2

## 2016-07-26 MED ORDER — PROPOFOL 10 MG/ML IV BOLUS
INTRAVENOUS | Status: DC | PRN
  Administered 2016-07-26: 100 mg via INTRAVENOUS

## 2016-07-26 MED ORDER — CALCIUM CARBONATE-VITAMIN D 500-200 MG-UNIT PO TABS
2.0000 | ORAL_TABLET | Freq: Every day | ORAL | Status: DC
  Administered 2016-07-27 – 2016-07-29 (×3): 2 via ORAL
  Filled 2016-07-26: qty 1
  Filled 2016-07-26 (×2): qty 2

## 2016-07-26 MED ORDER — FENTANYL CITRATE (PF) 100 MCG/2ML IJ SOLN
INTRAMUSCULAR | Status: AC
  Filled 2016-07-26: qty 2

## 2016-07-26 MED ORDER — BOOST / RESOURCE BREEZE PO LIQD
1.0000 | Freq: Three times a day (TID) | ORAL | Status: DC
  Administered 2016-07-26 – 2016-07-29 (×9): 1 via ORAL

## 2016-07-26 MED ORDER — SUCCINYLCHOLINE CHLORIDE 200 MG/10ML IV SOSY
PREFILLED_SYRINGE | INTRAVENOUS | Status: DC | PRN
Start: 2016-07-26 — End: 2016-07-26
  Administered 2016-07-26: 100 mg via INTRAVENOUS

## 2016-07-26 MED ORDER — LIDOCAINE 2% (20 MG/ML) 5 ML SYRINGE
INTRAMUSCULAR | Status: AC
  Filled 2016-07-26: qty 5

## 2016-07-26 MED ORDER — SUGAMMADEX SODIUM 200 MG/2ML IV SOLN
INTRAVENOUS | Status: DC | PRN
  Administered 2016-07-26: 200 mg via INTRAVENOUS

## 2016-07-26 MED ORDER — CLONIDINE HCL 0.1 MG/24HR TD PTWK
0.1000 mg | MEDICATED_PATCH | TRANSDERMAL | Status: DC
  Administered 2016-07-27: 0.1 mg via TRANSDERMAL
  Filled 2016-07-26: qty 1

## 2016-07-26 MED ORDER — ONDANSETRON 4 MG PO TBDP: 4.0000 mg | ORAL_TABLET | Freq: Four times a day (QID) | ORAL | Status: DC | PRN

## 2016-07-26 MED ORDER — DEXAMETHASONE SODIUM PHOSPHATE 10 MG/ML IJ SOLN
INTRAMUSCULAR | Status: AC
  Filled 2016-07-26: qty 1

## 2016-07-26 MED ORDER — BUPIVACAINE HCL (PF) 0.25 % IJ SOLN
INTRAMUSCULAR | Status: AC
  Filled 2016-07-26: qty 30

## 2016-07-26 MED ORDER — PIPERACILLIN-TAZOBACTAM 3.375 G IVPB 30 MIN: 3.3750 g | Freq: Three times a day (TID) | INTRAVENOUS | Status: DC

## 2016-07-26 MED ORDER — ONDANSETRON HCL 4 MG/2ML IJ SOLN
INTRAMUSCULAR | Status: DC | PRN
  Administered 2016-07-26: 4 mg via INTRAVENOUS

## 2016-07-26 MED ORDER — 0.9 % SODIUM CHLORIDE (POUR BTL) OPTIME
TOPICAL | Status: DC | PRN
  Administered 2016-07-26: 1000 mL

## 2016-07-26 MED ORDER — LIDOCAINE 2% (20 MG/ML) 5 ML SYRINGE
INTRAMUSCULAR | Status: DC | PRN
  Administered 2016-07-26: 40 mg via INTRAVENOUS

## 2016-07-26 MED ORDER — METOPROLOL TARTRATE 25 MG PO TABS
25.0000 mg | ORAL_TABLET | Freq: Two times a day (BID) | ORAL | Status: DC
  Administered 2016-07-26 – 2016-07-29 (×6): 25 mg via ORAL
  Filled 2016-07-26 (×3): qty 1
  Filled 2016-07-26: qty 2
  Filled 2016-07-26 (×2): qty 1

## 2016-07-26 MED ORDER — PROPOFOL 10 MG/ML IV BOLUS
INTRAVENOUS | Status: AC
  Filled 2016-07-26: qty 20

## 2016-07-26 MED ORDER — ONDANSETRON HCL 4 MG/2ML IJ SOLN: 4.0000 mg | Freq: Four times a day (QID) | INTRAMUSCULAR | Status: DC | PRN

## 2016-07-26 MED ORDER — CARBIDOPA-LEVODOPA 25-100 MG PO TABS
2.0000 | ORAL_TABLET | Freq: Three times a day (TID) | ORAL | Status: DC
  Administered 2016-07-26 – 2016-07-29 (×8): 2 via ORAL
  Filled 2016-07-26 (×6): qty 2
  Filled 2016-07-26: qty 1
  Filled 2016-07-26: qty 2

## 2016-07-26 MED ORDER — KCL IN DEXTROSE-NACL 20-5-0.45 MEQ/L-%-% IV SOLN
INTRAVENOUS | Status: DC
  Filled 2016-07-26: qty 1000

## 2016-07-26 MED ORDER — SUGAMMADEX SODIUM 200 MG/2ML IV SOLN
INTRAVENOUS | Status: AC
  Filled 2016-07-26: qty 2

## 2016-07-26 SURGICAL SUPPLY — 32 items
ADH SKN CLS APL DERMABOND .7 (GAUZE/BANDAGES/DRESSINGS) ×1
BLADE SURG 15 STRL LF DISP TIS (BLADE) ×1 IMPLANT
BLADE SURG 15 STRL SS (BLADE) ×3
COVER SURGICAL LIGHT HANDLE (MISCELLANEOUS) ×3 IMPLANT
DECANTER SPIKE VIAL GLASS SM (MISCELLANEOUS) ×3 IMPLANT
DERMABOND ADVANCED (GAUZE/BANDAGES/DRESSINGS) ×2
DERMABOND ADVANCED .7 DNX12 (GAUZE/BANDAGES/DRESSINGS) IMPLANT
DISSECTOR ROUND CHERRY 3/8 STR (MISCELLANEOUS) IMPLANT
DRAIN PENROSE 18X1/2 LTX STRL (DRAIN) ×3 IMPLANT
DRAPE LAPAROTOMY TRNSV 102X78 (DRAPE) ×3 IMPLANT
ELECT PENCIL ROCKER SW 15FT (MISCELLANEOUS) ×3 IMPLANT
ELECT REM PT RETURN 9FT ADLT (ELECTROSURGICAL) ×3
ELECTRODE REM PT RTRN 9FT ADLT (ELECTROSURGICAL) ×1 IMPLANT
GLOVE BIOGEL M 8.0 STRL (GLOVE) ×3 IMPLANT
GOWN STRL REUS W/TWL XL LVL3 (GOWN DISPOSABLE) ×6 IMPLANT
KIT BASIN OR (CUSTOM PROCEDURE TRAY) ×3 IMPLANT
MESH HERNIA 3X6 (Mesh General) ×2 IMPLANT
NEEDLE HYPO 22GX1.5 SAFETY (NEEDLE) ×3 IMPLANT
PACK BASIC VI WITH GOWN DISP (CUSTOM PROCEDURE TRAY) ×3 IMPLANT
SPONGE LAP 4X18 X RAY DECT (DISPOSABLE) ×3 IMPLANT
STAPLER VISISTAT 35W (STAPLE) ×2 IMPLANT
SUT PROLENE 2 0 CT2 30 (SUTURE) ×12 IMPLANT
SUT SILK 2 0 SH (SUTURE) IMPLANT
SUT VIC AB 2-0 SH 27 (SUTURE) ×3
SUT VIC AB 2-0 SH 27X BRD (SUTURE) ×1 IMPLANT
SUT VIC AB 4-0 SH 18 (SUTURE) ×3 IMPLANT
SUT VICRYL 4-0 (SUTURE) ×3 IMPLANT
SYR 20CC LL (SYRINGE) ×3 IMPLANT
SYR BULB IRRIGATION 50ML (SYRINGE) ×3 IMPLANT
TOWEL OR 17X26 10 PK STRL BLUE (TOWEL DISPOSABLE) ×3 IMPLANT
TOWEL OR NON WOVEN STRL DISP B (DISPOSABLE) ×3 IMPLANT
YANKAUER SUCT BULB TIP 10FT TU (MISCELLANEOUS) ×3 IMPLANT

## 2016-07-26 NOTE — Brief Op Note (Signed)
07/21/2016 - 07/26/2016  10:02 AM  PATIENT:  Kevin Roman  83 y.o. male  PRE-OPERATIVE DIAGNOSIS:  left inguinal hernia  POST-OPERATIVE DIAGNOSIS:  left inguinal hernia  PROCEDURE:  Procedure(s): HERNIA REPAIR INGUINAL ADULT (Left)  SURGEON:  Surgeon(s) and Role:    * Luretha MurphyMatthew Jasiah Elsen, MD - Primary  PHYSICIAN ASSISTANT:   ASSISTANTS: none   ANESTHESIA:   general  EBL:  Total I/O In: 1300 [I.V.:800; IV Piggyback:500] Out: 150 [Urine:100; Blood:50]  BLOOD ADMINISTERED:none  DRAINS: none   LOCAL MEDICATIONS USED:  BUPIVICAINE   SPECIMEN:  No Specimen  DISPOSITION OF SPECIMEN:  N/A  COUNTS:  YES  TOURNIQUET:  * No tourniquets in log *  DICTATION: .Other Dictation: Dictation Number [id number]  PLAN OF CARE: return to floor  PATIENT DISPOSITION:  PACU - hemodynamically stable.   Delay start of Pharmacological VTE agent (>24hrs) due to surgical blood loss or risk of bleeding: no

## 2016-07-26 NOTE — Anesthesia Preprocedure Evaluation (Addendum)
Anesthesia Evaluation  Patient identified by MRN, date of birth, ID band Patient awake    Reviewed: Allergy & Precautions, NPO status   History of Anesthesia Complications Negative for: history of anesthetic complications  Airway Mallampati: II  TM Distance: >3 FB Neck ROM: Full    Dental  (+) Edentulous Upper, Edentulous Lower   Pulmonary former smoker,    breath sounds clear to auscultation       Cardiovascular hypertension,  Rhythm:Regular Rate:Normal     Neuro/Psych Parkinson disease    GI/Hepatic Neg liver ROS, SBO c incarc hernia   Endo/Other    Renal/GU Renal InsufficiencyRenal disease     Musculoskeletal   Abdominal   Peds  Hematology  (+) anemia ,   Anesthesia Other Findings   Reproductive/Obstetrics                            Anesthesia Physical Anesthesia Plan  ASA: III  Anesthesia Plan: General   Post-op Pain Management:    Induction:   Airway Management Planned: Oral ETT  Additional Equipment:   Intra-op Plan:   Post-operative Plan: Extubation in OR and Possible Post-op intubation/ventilation  Informed Consent: I have reviewed the patients History and Physical, chart, labs and discussed the procedure including the risks, benefits and alternatives for the proposed anesthesia with the patient or authorized representative who has indicated his/her understanding and acceptance.   Dental advisory given  Plan Discussed with:   Anesthesia Plan Comments:         Anesthesia Quick Evaluation

## 2016-07-26 NOTE — H&P (View-Only) (Signed)
Subjective: He is a little confused but answering questions.  He has restraints but it is for his NG.  Nurse says he had a BM last PM and just now.  Abdomen is not distended and he is not tender.    Objective: Vital signs in last 24 hours: Temp:  [98.6 F (37 C)-100 F (37.8 C)] 98.7 F (37.1 C) (01/25 1006) Pulse Rate:  [78-88] 78 (01/25 1006) Resp:  [18-19] 18 (01/25 1006) BP: (143-174)/(61-102) 174/102 (01/25 1006) SpO2:  [95 %-98 %] 95 % (01/25 1006) Last BM Date: 07/24/16 25 PO recorded 300 urine Stool - none recorded Afebrile, BP up K+ still low  Intake/Output from previous day: 01/24 0701 - 01/25 0700 In: 2050 [P.O.:25; I.V.:1875; IV Piggyback:150] Out: 300 [Urine:300] Intake/Output this shift: No intake/output data recorded.  General appearance: alert, cooperative, no distress and still a little confused.   GI: soft, non-tender; bowel sounds normal; no masses,  no organomegaly  Lab Results:   Recent Labs  07/23/16 0555 07/24/16 0605  WBC 8.6 8.9  HGB 9.6* 10.0*  HCT 30.3* 30.8*  PLT 134* 148*    BMET  Recent Labs  07/23/16 0555 07/24/16 0605  NA 146* 147*  K 3.4* 3.4*  CL 111 114*  CO2 27 25  GLUCOSE 108* 114*  BUN 36* 28*  CREATININE 1.50* 1.42*  CALCIUM 8.9 8.9   PT/INR No results for input(s): LABPROT, INR in the last 72 hours.   Recent Labs Lab 07/21/16 1150 07/22/16 0640  AST 19 24  ALT 11* 22  ALKPHOS 71 51  BILITOT 0.6 0.8  PROT 7.8 6.8  ALBUMIN 4.5 3.8     Lipase  No results found for: LIPASE   Studies/Results: Dg Abd 1 View  Result Date: 07/24/2016 CLINICAL DATA:  Small bowel obstruction, followup EXAM: ABDOMEN - 1 VIEW COMPARISON:  Portable exam 0842 hours compared 07/23/2016 FINDINGS: Nasogastric tube coiled in proximal stomach. Retained contrast present in colon from mid transverse to sigmoid colon. Small bowel gas pattern appears normal. Surgical clips and phleboliths in pelvis. No definite bowel wall thickening or  acute osseous findings. IMPRESSION: Retained contrast in colon without small bowel dilatation. Electronically Signed   By: Mark  Boles M.D.   On: 07/24/2016 09:14   Dg Abd Portable 1v-small Bowel Obstruction Protocol-initial, 8 Hr Delay  Result Date: 07/23/2016 CLINICAL DATA:  8 hours status post Gastrografin administration for small bowel obstruction workup. Initial encounter. EXAM: PORTABLE ABDOMEN - 1 VIEW COMPARISON:  Abdominal radiograph performed earlier today at 8:33 a.m. FINDINGS: Contrast has progressed into the colon. Previously noted distended small bowel loops are less apparent, likely reflecting mild dysmotility. There is no evidence of bowel obstruction at this time. The patient's enteric tube is noted ending overlying the fundus of the stomach. No acute osseous abnormalities are seen. Mild degenerative change is noted at the lower lumbar spine. Postoperative change is noted at the pelvis. IMPRESSION: Contrast has progressed into the colon. Previously noted distended small bowel loops are less apparent, likely reflecting mild dysmotility. No evidence of bowel obstruction at this time. No additional imaging is required per small bowel obstruction protocol. Electronically Signed   By: Jeffery  Chang M.D.   On: 07/23/2016 20:06    Medications: . calcium-vitamin D  2 tablet Oral Daily  . carbidopa-levodopa  2 tablet Oral TID  . cloNIDine  0.1 mg Transdermal Weekly  . enoxaparin (LOVENOX) injection  40 mg Subcutaneous QHS  . folic acid  1 mg Oral Daily  .   Influenza vac split quadrivalent PF  0.5 mL Intramuscular Tomorrow-1000  . metoprolol  25 mg Oral BID  . piperacillin-tazobactam (ZOSYN)  IV  3.375 g Intravenous Q8H    Assessment/Plan SBO with reducible left inguinal hernia/hx of prostate surgery Abnormal liver CT/L>R hydrocele Hypokalemia K+ 3.4 - checking mag will add some to IV fluids Parkinson disease - some confusion 1/21-22/18, better this AM Hypertension Acute renal  disease FEVER with possible UTI FEN: IV fluids/clears DVT:  Lovenox ID: Zosyn 07/21/16 =>> day 5   Plan:  Pull NG,  Clears today, and plan [facility] repair tomorrow by Dr. Martin. He is back on PO meds also.     LOS: 4 days    Ewald Beg 07/25/2016 [phone]  

## 2016-07-26 NOTE — Progress Notes (Signed)
PROGRESS NOTE    Marella ChimesJames H Galer  WUJ:[id number] DOB: February 13, 1930 DOA: 07/21/2016 PCP: No PCP Per Patient    Brief Narrative:Kalman Rexene EdisonH Jearld FentonByers is a 83 y.o. male with medical history significant of parkinson's disease who presents with severe and persistent nausea and vomiting. CT abdomen and pelvis shows There is distension of the stomach with fluid and some air-fluid level. Significant small bowel distension with multiple air-fluid levels. There is a left inguinal scrotal canal hernia measures 6 by 7.5 cm containing moderate distended small bowel lobe. The exiting loop from the hernia axial image 74 is small caliber. Findings are consistent with small bowel obstruction due to incarcerated left inguinal hernia.2 There are indeterminate scattered low-density lesions within liver. The largest in left hepatic lobe anteriorly measures 1.4 cm consistent with a cyst. Given history of prostate cancer follow-up examination nonemergent enhanced MRI is recommended for further evaluation. Surgery consulted and recommendations given. He had  NG Tube in place; but currently clamped and with diet advanced to CLD. S/P left hernia repair today 1/26.   Assessment & Plan:   Active Problems:   HTN (hypertension)   Parkinson's disease (HCC)   Hypercholesteremia   Anemia   SBO (small bowel obstruction)   Incarcerated hernia   SBO from incarcerated inguinal hernia; -NGT discontinued on 07/25/16 -patient tolerated CLD; which was stopped on 1/26 for left hernia repair surgery -surgery completed w/o apparent complications and plans is to advance diet again to CLD, with progression as tolerated -PT to work with patient in am -CCS on board and dictating post operative course, will follow rec's  Leukocytosis, afebrile for 48 hours now, blood cultures done and neg so far  -will discontinue zosyn and follow patient off abx's -no signs of infection in UA -WBC's back to normal -will follow trend   Anemia: of chronic  disease  -no transfusion needed at this point, imagine some blood loss with surgery -will assess hgb trend   CKD stage 3: baseline creatinine between 1.3 to 1.5.  -Currently back to baseline at 1.42.  -will Monitor trend.   Hypokalemia, hypernatremia:  -Due to prior NGT suctions and poor intake -will continue IVF's with potassium supplementation until PO intake is tolerated.  -clear liquid diet resume now after surgery, will advance as tolerated  -will follow electrolytes trend and replete them as needed   parkinson's disease:  -continue carbidopa   Hypertension:  -will continue metoprolol, transdermal clonidine and continue PRN IV hydralazine.   Agitation/delirium -stable and much improved -no further restrains needed -will monitor  DVT prophylaxis: (heparin) Code Status: (Full/) Family Communication: none at bedside.  Disposition Plan:  Remains inpatient for now; s/p surgery (hernia repair). Will advance diet and assess clinical response. Hopefully home in 1-2 days.   Consultants:   Surgery.    Procedures:   -left inguinal hernia repair  1/26   Antimicrobials: IV zosyn 1/21>>>07/26/16   Subjective: In no acute distress. Somnolent from anesthesia. denies nausea, vomiting and abd pain.   Objective: Vitals:   07/26/16 1108 07/26/16 1210 07/26/16 1310 07/26/16 1405  BP: (!) 161/73 (!) 164/84 (!) 158/72 (!) 172/79  Pulse:  71 69 67  Resp:      Temp:    98.2 F (36.8 C)  TempSrc:    Oral  SpO2:  100% 100% 100%  Weight:      Height:        Intake/Output Summary (Last 24 hours) at 07/26/16 1811 Last data filed at 07/26/16 1026  Gross  per 24 hour  Intake          2293.75 ml  Output              150 ml  Net          2143.75 ml   Filed Weights   07/22/16 1215  Weight: 84 kg (185 lb 3 oz)    Examination:  General exam: Afebrile, somnolent from anesthesia, but answering appropriately and w/o signs of agitation. No nausea, no vomiting and denies abd pain.  NGT removed on 07/25/16 Respiratory system: Clear to auscultation. Respiratory effort normal. Cardiovascular system: S1 & S2 heard, RRR. No JVD, murmurs, rubs, gallops or clicks. No pedal edema. Gastrointestinal system: Abdomen is nondistended, soft non tender. BS are present. Central nervous system: Alert ,but confused.  Extremities: Symmetric 5 x 5 power. Skin: No rashes, Left pubic area with surgical wound from hernia repair.  Data Reviewed: I have personally reviewed following labs and imaging studies  CBC:  Recent Labs Lab 07/21/16 1150 07/22/16 0640 07/23/16 0555 07/24/16 0605  WBC 6.3 12.3* 8.6 8.9  NEUTROABS 5.6  --   --   --   HGB 11.1* 10.2* 9.6* 10.0*  HCT 33.8* 30.7* 30.3* 30.8*  MCV 88.5 88.7 90.4 90.1  PLT 170 123* 134* 148*   Basic Metabolic Panel:  Recent Labs Lab 07/21/16 1150 07/22/16 0640 07/23/16 0555 07/24/16 0605  NA 141 142 146* 147*  K 3.7 4.2 3.4* 3.4*  CL 105 108 111 114*  CO2 26 23 27 25   GLUCOSE 132* 157* 108* 114*  BUN 30* 40* 36* 28*  CREATININE 1.28* 1.55* 1.50* 1.42*  CALCIUM 9.6 9.0 8.9 8.9  MG  --   --  2.0  --    GFR: Estimated Creatinine Clearance: 40.9 mL/min (by C-G formula based on SCr of 1.42 mg/dL (H)).   Liver Function Tests:  Recent Labs Lab 07/21/16 1150 07/22/16 0640  AST 19 24  ALT 11* 22  ALKPHOS 71 51  BILITOT 0.6 0.8  PROT 7.8 6.8  ALBUMIN 4.5 3.8   CBG:  Recent Labs Lab 07/22/16 1644  GLUCAP 101*   Sepsis Labs:  Recent Labs Lab 07/21/16 1605  LATICACIDVEN 0.84    Recent Results (from the past 240 hour(s))  Culture, blood (Routine X 2) w Reflex to ID Panel     Status: None (Preliminary result)   Collection Time: 07/22/16 10:44 AM  Result Value Ref Range Status   Specimen Description BLOOD LEFT ARM  Final   Special Requests BOTTLES DRAWN AEROBIC AND ANAEROBIC 7CC  Final   Culture   Final    NO GROWTH 4 DAYS Performed at [facility], [address], [place], [place] [address]     Report Status PENDING  Incomplete  Culture, blood (Routine X 2) w Reflex to ID Panel     Status: None (Preliminary result)   Collection Time: 07/22/16 10:44 AM  Result Value Ref Range Status   Specimen Description BLOOD LEFT HAND  Final   Special Requests BOTTLES DRAWN AEROBIC AND ANAEROBIC 10CC  Final   Culture   Final    NO GROWTH 4 DAYS Performed at [facility], [address], [place], [place] [address]    Report Status PENDING  Incomplete      Radiology Studies: No results found.  Scheduled Meds: . calcium-vitamin D  2 tablet Oral Daily  . carbidopa-levodopa  2 tablet Oral TID  . cloNIDine  0.1 mg  Transdermal Weekly  . feeding supplement  1 Container Oral TID BM  . folic acid  1 mg Oral Daily  . heparin  5,000 Units Subcutaneous Q8H  . metoprolol  25 mg Oral BID  . pantoprazole (PROTONIX) IV  40 mg Intravenous QHS   Continuous Infusions: . dextrose 5 % and 0.45 % NaCl with KCl 20 mEq/L 50 mL/hr at 07/26/16 1143     LOS: 5 days    Time spent: 25 minutes.     Vassie Loll, MD [facility] Pager [phone]   If 7PM-7AM, please contact night-coverage [email or web address] Password [facility] 07/26/2016, 6:11 PM

## 2016-07-26 NOTE — Progress Notes (Signed)
Nutrition Follow-up  DOCUMENTATION CODES:   Not applicable  INTERVENTION:  Boost Breeze po TID, each supplement provides 250 kcal and 9 grams of protein  NUTRITION DIAGNOSIS:   Inadequate oral intake related to lethargy/confusion, altered GI function as evidenced by NPO status, severe depletion of muscle mass. Pt advanced to clear liquid diet.  GOAL:   Patient will meet greater than or equal to 90% of their needs  MONITOR:   Diet advancement, I & O's, Labs, Weight trends    ASSESSMENT:   83 y.o. male with medical history significant of parkinson's disease, CKD III, who presents with severe and persistent nausea and vomiting. Diagnosed with incarcerated inguinal hernia with bowel obstruction   Pt had surgery today to repair Inguinal hernia. Pt was eating 60% of clear liquid diet yesterday prior to NPO for surgery. Per family, this patient loves food and eats well at home. Pt with NGT in place. RD will order Boost Breeze. Pt with continued hypokalemia. Will continue to monitor.   Medications reviewed and include: Heparin, protonix, zosyn  Labs reviewed: Na 147(H), K 3.4(L), Cl 114(H), BUN 28(H), creat 1.42(H)  Diet Order:  Diet clear liquid Room service appropriate? Yes; Fluid consistency: Thin  Skin:  Reviewed, no issues  Last BM:  1/21  Height:   Ht Readings from Last 1 Encounters:  07/22/16 5\' 8"  (1.727 m)    Weight:   Wt Readings from Last 1 Encounters:  07/22/16 185 lb 3 oz (84 kg)    Ideal Body Weight:  70 kg  BMI:  Body mass index is 28.16 kg/m.  Estimated Nutritional Needs:   Kcal:  1700-1900kcal/day   Protein:  101-117g/day   Fluid:  >1.7L/day   EDUCATION NEEDS:   No education needs identified at this time  Betsey Holidayasey Generoso Cropper, RD, LDN Pager #[phone]- [phone]

## 2016-07-26 NOTE — Transfer of Care (Signed)
Immediate Anesthesia Transfer of Care Note  Patient: Kevin ChimesJames H Roman  Procedure(s) Performed: Procedure(s): HERNIA REPAIR INGUINAL ADULT (Left)  Patient Location: PACU  Anesthesia Type:General  Level of Consciousness: awake and patient cooperative  Airway & Oxygen Therapy: Patient Spontanous Breathing and Patient connected to face mask oxygen  Post-op Assessment: Report given to RN and Post -op Vital signs reviewed and stable  Post vital signs: Reviewed and stable  Last Vitals:  Vitals:   07/25/16 2027 07/26/16 0624  BP: (!) 153/77 (!) 179/92  Pulse: 73 74  Resp: 20 20  Temp: 36.4 C 37.1 C    Last Pain:  Vitals:   07/26/16 0624  TempSrc: Oral  PainSc:          Complications: No apparent anesthesia complications

## 2016-07-26 NOTE — Anesthesia Procedure Notes (Signed)
Procedure Name: Intubation Date/Time: 07/26/2016 7:56 AM Performed by: Dione Booze Pre-anesthesia Checklist: Emergency Drugs available, Patient identified, Suction available and Patient being monitored Patient Re-evaluated:Patient Re-evaluated prior to inductionOxygen Delivery Method: Circle system utilized Preoxygenation: Pre-oxygenation with 100% oxygen Intubation Type: IV induction Laryngoscope Size: Mac and 4 Grade View: Grade I Tube type: Oral Tube size: 7.5 mm Number of attempts: 1 Airway Equipment and Method: Stylet Placement Confirmation: ETT inserted through vocal cords under direct vision,  positive ETCO2 and breath sounds checked- equal and bilateral Secured at: 22 cm Tube secured with: Tape Dental Injury: Teeth and Oropharynx as per pre-operative assessment

## 2016-07-26 NOTE — Interval H&P Note (Signed)
History and Physical Interval Note:  07/26/2016 7:36 AM  Kevin Roman  has presented today for surgery, with the diagnosis of left inguinal hernia  The various methods of treatment have been discussed with the patient and family. After consideration of risks, benefits and other options for treatment, the patient has consented to  Procedure(s): HERNIA REPAIR INGUINAL ADULT (Left) as a surgical intervention .  The patient's history has been reviewed, patient examined, no change in status, stable for surgery.  I have reviewed the patient's chart and labs.  Questions were answered to the patient's satisfaction.     [name] B

## 2016-07-26 NOTE — Anesthesia Postprocedure Evaluation (Addendum)
Anesthesia Post Note  Patient: Kevin Roman  Procedure(s) Performed: Procedure(s) (LRB): HERNIA REPAIR INGUINAL ADULT (Left)  Patient location during evaluation: PACU Anesthesia Type: General Level of consciousness: awake, sedated and oriented Pain management: pain level controlled Vital Signs Assessment: post-procedure vital signs reviewed and stable Respiratory status: spontaneous breathing, nonlabored ventilation, respiratory function stable and patient connected to nasal cannula oxygen Cardiovascular status: blood pressure returned to baseline and stable Postop Assessment: no signs of nausea or vomiting Anesthetic complications: no       Last Vitals:  Vitals:   07/26/16 1045 07/26/16 1051  BP: (!) 171/92 (!) 166/92  Pulse: 71 74  Resp: 18 15  Temp:  36.9 C    Last Pain:  Vitals:   07/26/16 1030  TempSrc:   PainSc: Asleep                 Suriyah Vergara,Denard TERRILL

## 2016-07-26 NOTE — Discharge Instructions (Signed)
[facility] _______[facility] ° °INGUINAL HERNIA REPAIR: POST OP INSTRUCTIONS ° °Always review your discharge instruction sheet given to you by the facility where your surgery was performed. °IF YOU HAVE DISABILITY OR FAMILY LEAVE FORMS, YOU MUST BRING THEM TO THE OFFICE FOR PROCESSING.   °DO NOT GIVE THEM TO YOUR DOCTOR. ° °1. A  prescription for pain medication may be given to you upon discharge.  Take your pain medication as prescribed, if needed.  If narcotic pain medicine is not needed, then you may take acetaminophen (Tylenol) or ibuprofen (Advil) as needed. °2. Take your usually prescribed medications unless otherwise directed. °If you need a refill on your pain medication, please contact your pharmacy.  They will contact our office to request authorization. Prescriptions will not be filled after 5 pm or on week-ends. °3. You should follow a light diet the first 24 hours after arrival home, such as soup and crackers, etc.  Be sure to include lots of fluids daily.  Resume your normal diet the day after surgery. °4.Most patients will experience some swelling and bruising around the umbilicus or in the groin and scrotum.  Ice packs and reclining will help.  Swelling and bruising can take several days to resolve.  °6. It is common to experience some constipation if taking pain medication after surgery.  Increasing fluid intake and taking a stool softener (such as Colace) will usually help or prevent this problem from occurring.  A mild laxative (Milk of Magnesia or Miralax) should be taken according to package directions if there are no bowel movements after 48 hours. °7. Unless discharge instructions indicate otherwise, you may remove your bandages 24-48 hours after surgery, and you may shower at that time.  You may have steri-strips (small skin tapes) in place directly over the incision.  These strips should be left on the skin for 7-10 days.  If your surgeon used skin glue on the incision, you may  shower in 24 hours.  The glue will flake off over the next 2-3 weeks.  Any sutures or staples will be removed at the office during your follow-up visit. °8. ACTIVITIES:  You may resume regular (light) daily activities beginning the next day--such as daily self-care, walking, climbing stairs--gradually increasing activities as tolerated.  You may have sexual intercourse when it is comfortable.  Refrain from any heavy lifting or straining until approved by your doctor. ° °a.You may drive when you are no longer taking prescription pain medication, you can comfortably wear a seatbelt, and you can safely maneuver your car and apply brakes. ° ° °9.You should see your doctor in the office for a follow-up appointment approximately 2-3 weeks after your surgery.  Make sure that you call for this appointment within a day or two after you arrive home to insure a convenient appointment time. ° ° °WHEN TO CALL YOUR DOCTOR: °1. Fever over 101.0 °2. Inability to urinate °3. Nausea and/or vomiting °4. Extreme swelling or bruising °5. Continued bleeding from incision. °6. Increased pain, redness, or drainage from the incision ° °The clinic staff is available to answer your questions during regular business hours.  Please don’t hesitate to call and ask to speak to one of the nurses for clinical concerns.  If you have a medical emergency, go to the nearest emergency room or call 911.  A surgeon from [facility] is always on call at the hospital ° ° °[address], [address], [place], [place]  [address] ? ° [address]   [place], [place]   [address] [phone] ? [phone] ? FAX [phone] Web site: [email or web address]

## 2016-07-27 LAB — CBC
HEMATOCRIT: 26.6 % — AB (ref 39.0–52.0)
Hemoglobin: 8.2 g/dL — ABNORMAL LOW (ref 13.0–17.0)
MCH: 27.7 pg (ref 26.0–34.0)
MCHC: 30.8 g/dL (ref 30.0–36.0)
MCV: 89.9 fL (ref 78.0–100.0)
Platelets: 162 10*3/uL (ref 150–400)
RBC: 2.96 MIL/uL — ABNORMAL LOW (ref 4.22–5.81)
RDW: 13.8 % (ref 11.5–15.5)
WBC: 6.2 10*3/uL (ref 4.0–10.5)

## 2016-07-27 LAB — CULTURE, BLOOD (ROUTINE X 2)
CULTURE: NO GROWTH
Culture: NO GROWTH

## 2016-07-27 LAB — COMPREHENSIVE METABOLIC PANEL
ALT: 6 U/L — AB (ref 17–63)
AST: 21 U/L (ref 15–41)
Albumin: 3.2 g/dL — ABNORMAL LOW (ref 3.5–5.0)
Alkaline Phosphatase: 50 U/L (ref 38–126)
Anion gap: 6 (ref 5–15)
BILIRUBIN TOTAL: 0.8 mg/dL (ref 0.3–1.2)
BUN: 26 mg/dL — AB (ref 6–20)
CO2: 25 mmol/L (ref 22–32)
CREATININE: 1.52 mg/dL — AB (ref 0.61–1.24)
Calcium: 8.4 mg/dL — ABNORMAL LOW (ref 8.9–10.3)
Chloride: 116 mmol/L — ABNORMAL HIGH (ref 101–111)
GFR, EST AFRICAN AMERICAN: 47 mL/min — AB (ref >60)
GFR, EST NON AFRICAN AMERICAN: 40 mL/min — AB (ref >60)
Glucose, Bld: 108 mg/dL — ABNORMAL HIGH (ref 65–99)
POTASSIUM: 3.7 mmol/L (ref 3.5–5.1)
Sodium: 147 mmol/L — ABNORMAL HIGH (ref 135–145)
TOTAL PROTEIN: 6.5 g/dL (ref 6.5–8.1)

## 2016-07-27 NOTE — Progress Notes (Signed)
Patient ID: Kevin ChimesJames H Soter, male   DOB: [birth year], 83 y.o.   MRN: [id number] 1 Day Post-Op   Subjective: Denies pain or nausea.  Pt not sure about BM but recorded as yesterday  Objective: Vital signs in last 24 hours: Temp:  [98.2 F (36.8 C)-98.6 F (37 C)] 98.4 F (36.9 C) (01/27 0502) Pulse Rate:  [65-75] 73 (01/27 0502) Resp:  [15-18] 18 (01/27 0502) BP: (149-174)/(69-92) 149/69 (01/27 0502) SpO2:  [99 %-100 %] 99 % (01/27 0502) Last BM Date: 07/26/16  Intake/Output from previous day: 01/26 0701 - 01/27 0700 In: 1400 [I.V.:900; IV Piggyback:500] Out: 150 [Urine:100; Blood:50] Intake/Output this shift: No intake/output data recorded.  General appearance: alert, cooperative and no distress GI: Distended and tympanitic but non tender Incision/Wound: Left groin incision clean and dry  Lab Results:   Recent Labs  07/27/16 0641  WBC 6.2  HGB 8.2*  HCT 26.6*  PLT 162   BMET  Recent Labs  07/27/16 0641  NA 147*  K 3.7  CL 116*  CO2 25  GLUCOSE 108*  BUN 26*  CREATININE 1.52*  CALCIUM 8.4*     Studies/Results: No results found.  Anti-infectives: Anti-infectives    Start     Dose/Rate Route Frequency Ordered Stop   07/26/16 1400  piperacillin-tazobactam (ZOSYN) IVPB 3.375 g  Status:  Discontinued     3.375 g 100 mL/hr over 30 Minutes Intravenous Every 8 hours 07/26/16 1123 07/26/16 1124   07/26/16 1400  piperacillin-tazobactam (ZOSYN) IVPB 3.375 g  Status:  Discontinued     3.375 g 12.5 mL/hr over 240 Minutes Intravenous  Once 07/26/16 1125 07/26/16 1811   07/22/16 1800  piperacillin-tazobactam (ZOSYN) IVPB 3.375 g  Status:  Discontinued     3.375 g 12.5 mL/hr over 240 Minutes Intravenous Every 8 hours 07/22/16 1730 07/26/16 1123   07/21/16 1530  piperacillin-tazobactam (ZOSYN) IVPB 3.375 g     3.375 g 12.5 mL/hr over 240 Minutes Intravenous  Once 07/21/16 1527 07/21/16 1955      Assessment/Plan: s/p Procedure(s): HERNIA REPAIR INGUINAL  ADULT Stable.  As abdomen is quite distended will leave on CL diet today. Needs to be OOB (ordered)   LOS: 6 days    Shanicqua Coldren T 07/27/2016

## 2016-07-27 NOTE — Evaluation (Signed)
Physical Therapy Evaluation Patient Details Name: Kevin Roman MRN: [id number] DOB: 14-Apr-1930 Today's Date: 07/27/2016   History of Present Illness  83 y.o. male with h/o Parkinsons admitted with nausea and vomiting, SBO Dx of incarcerated hernia, s/p SBO with reducible left inguinal hernia  Clinical Impression  Pt admitted with above diagnosis. Pt currently with functional limitations due to the deficits listed below (see PT Problem List).  Pt will benefit from skilled PT to increase their independence and safety with mobility to allow discharge to the venue listed below.   Pt requiring incr assist today compared to  his baseline, per wife she has been struggling to assist him at home with ADLs; recommend SNF post acute, family in agreement; will continue to follow in acute setting; pt is quite pleasant and cooperative    Follow Up Recommendations SNF    Equipment Recommendations  None recommended by PT    Recommendations for Other Services       Precautions / Restrictions Precautions Precautions: Fall Restrictions Weight Bearing Restrictions: No      Mobility  Bed Mobility Overal bed mobility: Needs Assistance Bed Mobility: Rolling;Sidelying to Sit Rolling: Mod assist (R and L) Sidelying to sit: Mod assist       General bed mobility comments: tactile cues to initiate movement, assist with trunk and LEs, incr time  Transfers Overall transfer level: Needs assistance Equipment used: Rolling walker (2 wheeled) Transfers: Sit to/from Stand Sit to Stand: Min assist         General transfer comment: assist to rise and stabilize, incr time, cues for knee/hip extension  Ambulation/Gait Ambulation/Gait assistance: Min assist Ambulation Distance (Feet): 40 Feet Assistive device: Rolling walker (2 wheeled) Gait Pattern/deviations: Step-through pattern;Trunk flexed;Narrow base of support;Shuffle;Decreased stride length Gait velocity: decr   General Gait Details: cues  for trunk/hip/knee extension, incr step length and RW position from self  Stairs            Wheelchair Mobility    Modified Rankin (Stroke Patients Only)       Balance Overall balance assessment: Needs assistance Sitting-balance support: Feet supported;Bilateral upper extremity supported Sitting balance-Leahy Scale: Fair     Standing balance support: Bilateral upper extremity supported Standing balance-Leahy Scale: Poor Standing balance comment: reliant on UEs, cues for posture in standing             High level balance activites: Backward walking;Turns               Pertinent Vitals/Pain Pain Assessment: No/denies pain    Home Living Family/patient expects to be discharged to:: Private residence Living Arrangements: Spouse/significant other Available Help at Discharge: Family Type of Home: House Home Access: Level entry     Home Layout: One level Home Equipment: Environmental consultantWalker - 2 wheels Additional Comments: aide 3d/wk, 3hrs/day    Prior Function Level of Independence: Independent with assistive device(s);Needs assistance      ADL's / Homemaking Assistance Needed: assist with bathing and dressing         Hand Dominance        Extremity/Trunk Assessment   Upper Extremity Assessment Upper Extremity Assessment:  (resting tremors)    Lower Extremity Assessment Lower Extremity Assessment: Generalized weakness       Communication   Communication: No difficulties  Cognition Arousal/Alertness: Awake/alert Behavior During Therapy: WFL for tasks assessed/performed Overall Cognitive Status: Within Functional Limits for tasks assessed  General Comments      Exercises     Assessment/Plan    PT Assessment Patient needs continued PT services  PT Problem List Decreased strength;Decreased activity tolerance;Decreased mobility;Decreased coordination          PT Treatment Interventions DME instruction;Functional  mobility training;Therapeutic activities;Stair training;Therapeutic exercise;Gait training;Patient/family education    PT Goals (Current goals can be found in the Care Plan section)  Acute Rehab PT Goals Patient Stated Goal: family desires rehab PT Goal Formulation: With family Time For Goal Achievement: 08/03/16 Potential to Achieve Goals: Good    Frequency Min 3X/week   Barriers to discharge        Co-evaluation               End of Session Equipment Utilized During Treatment: Gait belt Activity Tolerance: Patient tolerated treatment well;Patient limited by fatigue Patient left: in chair           Time: 1521-1550 PT Time Calculation (min) (ACUTE ONLY): 29 min   Charges:   PT Evaluation $PT Eval Moderate Complexity: 1 Procedure PT Treatments $Gait Training: 8-22 mins   PT G Codes:        Kevin Roman Aug 26, 2016, 5:21 PM

## 2016-07-27 NOTE — Progress Notes (Signed)
PROGRESS NOTE    Kevin Roman  ZOX:[id number] DOB: 1930/06/01 DOA: 07/21/2016 PCP: No PCP Per Patient    Brief Narrative:Kevin Roman is a 83 y.o. male with medical history significant of parkinson's disease who presents with severe and persistent nausea and vomiting. CT abdomen and pelvis shows There is distension of the stomach with fluid and some air-fluid level. Significant small bowel distension with multiple air-fluid levels. There is a left inguinal scrotal canal hernia measures 6 by 7.5 cm containing moderate distended small bowel lobe. The exiting loop from the hernia axial image 74 is small caliber. Findings are consistent with small bowel obstruction due to incarcerated left inguinal hernia.2 There are indeterminate scattered low-density lesions within liver. The largest in left hepatic lobe anteriorly measures 1.4 cm consistent with a cyst. Given history of prostate cancer follow-up examination nonemergent enhanced MRI is recommended for further evaluation. Surgery consulted and recommendations given. He had  NG Tube in place; but currently clamped and with diet advanced to CLD. S/P left hernia repair today 1/26.   Assessment & Plan:   Active Problems:   HTN (hypertension)   Parkinson's disease (HCC)   Hypercholesteremia   Anemia   SBO (small bowel obstruction)   Incarcerated hernia   SBO from incarcerated inguinal hernia; -NGT discontinued on 07/25/16 -patient tolerated CLD; which was stopped on 1/26 for left hernia repair surgery -surgery completed w/o apparent complications and plans is to advance diet again to CLD, with progression as tolerated -PT to work with patient in am -CCS on board and dictating post operative course, will follow rec's  Leukocytosis, afebrile for 48 hours now, blood cultures done and neg so far  -will discontinue zosyn and follow patient off abx's -no signs of infection in UA -WBC's back to normal -will follow trend   Anemia: of chronic  disease  -no transfusion needed at this point, imagine some blood loss with surgery -will assess hgb trend, no transfusion needed currently -Hgb 8.2  CKD stage 3: baseline creatinine between 1.3 to 1.5.  -Currently at baseline at 1.42---1.52  -will Monitor trend.  -providing maintenance fluids, while having difficulties tolerating PO's.  Hypokalemia, hypernatremia:  -Due to prior NGT suctions and poor intake -will continue IVF's with potassium supplementation until PO intake is tolerated.  -clear liquid diet resume now after surgery, will advance as tolerated  -will follow electrolytes trend and replete them as needed   parkinson's disease:  -continue carbidopa   Hypertension:  -will continue metoprolol, transdermal clonidine and continue PRN IV hydralazine.   Agitation/delirium -stable and much improved -no further restrains needed -will monitor  DVT prophylaxis: (heparin) Code Status: (Full/) Family Communication: none at bedside.  Disposition Plan:  Remains inpatient for now; s/p surgery (hernia repair). Will advance diet and assess clinical response. Hopefully home in 1-2 days.   Consultants:   Surgery.    Procedures:   Left inguinal hernia repair  1/26   Antimicrobials: IV zosyn 1/21>>>07/26/16   Subjective: In no acute distress. No agitation, answering questions properly and denying nausea, vomiting and abd pain.   Objective: Vitals:   07/26/16 1405 07/26/16 2056 07/27/16 0502 07/27/16 1500  BP: (!) 172/79 (!) 153/82 (!) 149/69 (!) 156/73  Pulse: 67 65 73 99  Resp:  18 18 18   Temp: 98.2 F (36.8 C) 98.6 F (37 C) 98.4 F (36.9 C) 99 F (37.2 C)  TempSrc: Oral Oral Axillary Oral  SpO2: 100% 100% 99% 100%  Weight:  Height:        Intake/Output Summary (Last 24 hours) at 07/27/16 1643 Last data filed at 07/27/16 0800  Gross per 24 hour  Intake              520 ml  Output                0 ml  Net              520 ml   Filed Weights    07/22/16 1215  Weight: 84 kg (185 lb 3 oz)    Examination:  General exam: Afebrile, in no acute distress, denies CP, SOB and abdominal pain. patient is calmed and w/o  signs of agitation. Tolerating CLD, no BM's Respiratory system: Clear to auscultation. Respiratory effort normal. Cardiovascular system: S1 & S2 heard, RRR. No JVD, murmurs, rubs, gallops or clicks. No pedal edema. Gastrointestinal system: Abdomen is slightly distended today, non tender. BS are present. Central nervous system: Alert ,but confused.  Extremities: Symmetric 5 x 5 power. Skin: No rashes, Left pubic area with surgical wound from hernia repair.  Data Reviewed: I have personally reviewed following labs and imaging studies  CBC:  Recent Labs Lab 07/21/16 1150 07/22/16 0640 07/23/16 0555 07/24/16 0605 07/27/16 0641  WBC 6.3 12.3* 8.6 8.9 6.2  NEUTROABS 5.6  --   --   --   --   HGB 11.1* 10.2* 9.6* 10.0* 8.2*  HCT 33.8* 30.7* 30.3* 30.8* 26.6*  MCV 88.5 88.7 90.4 90.1 89.9  PLT 170 123* 134* 148* 162   Basic Metabolic Panel:  Recent Labs Lab 07/21/16 1150 07/22/16 0640 07/23/16 0555 07/24/16 0605 07/27/16 0641  NA 141 142 146* 147* 147*  K 3.7 4.2 3.4* 3.4* 3.7  CL 105 108 111 114* 116*  CO2 26 23 27 25 25   GLUCOSE 132* 157* 108* 114* 108*  BUN 30* 40* 36* 28* 26*  CREATININE 1.28* 1.55* 1.50* 1.42* 1.52*  CALCIUM 9.6 9.0 8.9 8.9 8.4*  MG  --   --  2.0  --   --    GFR: Estimated Creatinine Clearance: 38.2 mL/min (by C-G formula based on SCr of 1.52 mg/dL (H)).   Liver Function Tests:  Recent Labs Lab 07/21/16 1150 07/22/16 0640 07/27/16 0641  AST 19 24 21   ALT 11* 22 6*  ALKPHOS 71 51 50  BILITOT 0.6 0.8 0.8  PROT 7.8 6.8 6.5  ALBUMIN 4.5 3.8 3.2*   CBG:  Recent Labs Lab 07/22/16 1644  GLUCAP 101*   Sepsis Labs:  Recent Labs Lab 07/21/16 1605  LATICACIDVEN 0.84    Recent Results (from the past 240 hour(s))  Culture, blood (Routine X 2) w Reflex to ID Panel      Status: None   Collection Time: 07/22/16 10:44 AM  Result Value Ref Range Status   Specimen Description BLOOD LEFT ARM  Final   Special Requests BOTTLES DRAWN AEROBIC AND ANAEROBIC 7CC  Final   Culture   Final    NO GROWTH 5 DAYS Performed at [facility], [address], [place], [place] [address]    Report Status 07/27/2016 FINAL  Final  Culture, blood (Routine X 2) w Reflex to ID Panel     Status: None   Collection Time: 07/22/16 10:44 AM  Result Value Ref Range Status   Specimen Description BLOOD LEFT HAND  Final   Special Requests BOTTLES DRAWN AEROBIC AND ANAEROBIC 10CC  Final   Culture   Final  NO GROWTH 5 DAYS Performed at [facility], [address] [place], [place] [address]    Report Status 07/27/2016 FINAL  Final      Radiology Studies: No results found.  Scheduled Meds: . calcium-vitamin D  2 tablet Oral Daily  . carbidopa-levodopa  2 tablet Oral TID  . cloNIDine  0.1 mg Transdermal Weekly  . feeding supplement  1 Container Oral TID BM  . folic acid  1 mg Oral Daily  . heparin  5,000 Units Subcutaneous Q8H  . metoprolol  25 mg Oral BID  . pantoprazole (PROTONIX) IV  40 mg Intravenous QHS   Continuous Infusions: . dextrose 5 % and 0.45 % NaCl with KCl 20 mEq/L       LOS: 6 days    Time spent: 25 minutes.     Vassie LollMadera, Christal Lagerstrom, MD [facility] Pager [phone]   If 7PM-7AM, please contact night-coverage [email or web address] Password Detar [email or web address] 07/27/2016, 4:43 PM

## 2016-07-28 DIAGNOSIS — R5381 Other malaise: Secondary | ICD-10-CM

## 2016-07-28 DIAGNOSIS — E876 Hypokalemia: Secondary | ICD-10-CM

## 2016-07-28 LAB — BASIC METABOLIC PANEL
ANION GAP: 5 (ref 5–15)
BUN: 25 mg/dL — ABNORMAL HIGH (ref 6–20)
CO2: 25 mmol/L (ref 22–32)
Calcium: 8.2 mg/dL — ABNORMAL LOW (ref 8.9–10.3)
Chloride: 111 mmol/L (ref 101–111)
Creatinine, Ser: 1.37 mg/dL — ABNORMAL HIGH (ref 0.61–1.24)
GFR calc Af Amer: 53 mL/min — ABNORMAL LOW (ref >60)
GFR, EST NON AFRICAN AMERICAN: 46 mL/min — AB (ref >60)
GLUCOSE: 107 mg/dL — AB (ref 65–99)
POTASSIUM: 3.7 mmol/L (ref 3.5–5.1)
Sodium: 141 mmol/L (ref 135–145)

## 2016-07-28 LAB — CBC
HEMATOCRIT: 25.2 % — AB (ref 39.0–52.0)
Hemoglobin: 8.3 g/dL — ABNORMAL LOW (ref 13.0–17.0)
MCH: 29.5 pg (ref 26.0–34.0)
MCHC: 32.9 g/dL (ref 30.0–36.0)
MCV: 89.7 fL (ref 78.0–100.0)
PLATELETS: 161 10*3/uL (ref 150–400)
RBC: 2.81 MIL/uL — AB (ref 4.22–5.81)
RDW: 13.8 % (ref 11.5–15.5)
WBC: 6 10*3/uL (ref 4.0–10.5)

## 2016-07-28 MED ORDER — ACETAMINOPHEN 325 MG PO TABS
650.0000 mg | ORAL_TABLET | Freq: Four times a day (QID) | ORAL | Status: DC | PRN
  Administered 2016-07-28: 650 mg via ORAL
  Filled 2016-07-28: qty 2

## 2016-07-28 NOTE — Progress Notes (Signed)
Patient ID: Kevin Roman, male   DOB: March 16, 1930, 83 y.o.   MRN: [id number] 2 Days Post-Op   Subjective: No complaints this morning. Denies abdominal pain or nausea. Tolerating clear liquids. States he had a normal bowel movement and passing flatus this morning.  Objective: Vital signs in last 24 hours: Temp:  [98.8 F (37.1 C)-99.3 F (37.4 C)] 98.8 F (37.1 C) (01/28 0456) Pulse Rate:  [75-99] 75 (01/28 0456) Resp:  [18-19] 18 (01/28 0456) BP: (142-156)/(66-73) 142/66 (01/28 0456) SpO2:  [95 %-100 %] 95 % (01/28 0456) Last BM Date: 07/27/16  Intake/Output from previous day: 01/27 0701 - 01/28 0700 In: 1749.2 [P.O.:1460; I.V.:289.2] Out: -  Intake/Output this shift: No intake/output data recorded.  General appearance: alert, cooperative and no distress GI: Nontender. Still mildly to moderately distended and somewhat tympanitic but less than yesterday. Incision/Wound: Clean and dry without erythema or drainage  Lab Results:   Recent Labs  07/27/16 0641 07/28/16 0540  WBC 6.2 6.0  HGB 8.2* 8.3*  HCT 26.6* 25.2*  PLT 162 161   BMET  Recent Labs  07/27/16 0641 07/28/16 0540  NA 147* 141  K 3.7 3.7  CL 116* 111  CO2 25 25  GLUCOSE 108* 107*  BUN 26* 25*  CREATININE 1.52* 1.37*  CALCIUM 8.4* 8.2*     Studies/Results: No results found.  Anti-infectives: Anti-infectives    Start     Dose/Rate Route Frequency Ordered Stop   07/26/16 1400  piperacillin-tazobactam (ZOSYN) IVPB 3.375 g  Status:  Discontinued     3.375 g 100 mL/hr over 30 Minutes Intravenous Every 8 hours 07/26/16 1123 07/26/16 1124   07/26/16 1400  piperacillin-tazobactam (ZOSYN) IVPB 3.375 g  Status:  Discontinued     3.375 g 12.5 mL/hr over 240 Minutes Intravenous  Once 07/26/16 1125 07/26/16 1811   07/22/16 1800  piperacillin-tazobactam (ZOSYN) IVPB 3.375 g  Status:  Discontinued     3.375 g 12.5 mL/hr over 240 Minutes Intravenous Every 8 hours 07/22/16 1730 07/26/16 1123   07/21/16  1530  piperacillin-tazobactam (ZOSYN) IVPB 3.375 g     3.375 g 12.5 mL/hr over 240 Minutes Intravenous  Once 07/21/16 1527 07/21/16 1955      Assessment/Plan: s/p Procedure(s): HERNIA REPAIR INGUINAL ADULT Doing well. Has good bowel function and abdomen is softer. Advance to full liquid diet. Renal function improving. Physical therapy working with patient and likely will require SNF   LOS: 7 days    Kevin Roman T 07/28/2016

## 2016-07-28 NOTE — Progress Notes (Signed)
PROGRESS NOTE    Kevin Roman  ZOX:[id number] DOB: 1929-11-27 DOA: 07/21/2016 PCP: No PCP Per Patient    Brief Narrative:Kevin Roman is a 83 y.o. male with medical history significant of parkinson's disease who presents with severe and persistent nausea and vomiting. CT abdomen and pelvis shows There is distension of the stomach with fluid and some air-fluid level. Significant small bowel distension with multiple air-fluid levels. There is a left inguinal scrotal canal hernia measures 6 by 7.5 cm containing moderate distended small bowel lobe. The exiting loop from the hernia axial image 74 is small caliber. Findings are consistent with small bowel obstruction due to incarcerated left inguinal hernia.2 There are indeterminate scattered low-density lesions within liver. The largest in left hepatic lobe anteriorly measures 1.4 cm consistent with a cyst. Given history of prostate cancer follow-up examination nonemergent enhanced MRI is recommended for further evaluation. Surgery consulted and recommendations given. He had  NG Tube in place; but currently clamped and with diet advanced to CLD. S/P left hernia repair today 1/26.   Assessment & Plan:   Active Problems:   HTN (hypertension)   Parkinson's disease (HCC)   Hypercholesteremia   Anemia   SBO (small bowel obstruction)   Incarcerated hernia   SBO from incarcerated inguinal hernia; -NGT discontinued on 07/25/16 -patient tolerated CLD; which was stopped on 1/26 for left hernia repair surgery -surgery completed w/o apparent complications and plan is to advance diet now to full liquid diet, with progression as tolerated -PT has evaluated patient and recommending SNF short term for rehab.  -CCS on board and dictating post operative course, will follow rec's -if remains stable and continue tolerating diet, might be ready for SNF discharge in 1-2 days. SW aware.  Leukocytosis, afebrile for 48 hours now, blood cultures done and neg so far    -will discontinue zosyn and follow patient off abx's -no signs of infection in UA -WBC's back to normal -will follow trend   Anemia: of chronic disease  -no transfusion needed at this point, imagine some blood loss with surgery -will assess hgb trend, no transfusion needed currently -Hgb 8.3  CKD stage 3: baseline creatinine between 1.3 to 1.5.  -Currently at baseline at 1.42---1.52---1.37  -will Monitor trend.  -providing maintenance fluids, while having difficulties tolerating PO's.  Hypokalemia, hypernatremia:  -Due to prior NGT suctions and poor intake -will continue IVF's with potassium supplementation until PO intake is tolerated.  -clear liquid diet resume now after surgery, will advance as tolerated  -will follow electrolytes trend and replete them as needed   parkinson's disease:  -continue carbidopa   Hypertension:  -will continue metoprolol, transdermal clonidine and continue PRN IV hydralazine.   Agitation/delirium -stable and resolve dnow -no further restrains needed -will monitor  DVT prophylaxis: (heparin) Code Status: (Full/) Family Communication: none at bedside.  Disposition Plan:  Remains inpatient for now; s/p surgery (hernia repair). Will advance diet and assess clinical response. Will need SNF at discharge.   Consultants:   Surgery.    Procedures:   Left inguinal hernia repair  1/26   Antimicrobials: IV zosyn 1/21>>>07/26/16   Subjective: In no acute distress. No agitation, answering questions properly and denying nausea, vomiting and abd pain. Had BM and is passing flatus.  Objective: Vitals:   07/27/16 0502 07/27/16 1500 07/27/16 2055 07/28/16 0456  BP: (!) 149/69 (!) 156/73 (!) 145/67 (!) 142/66  Pulse: 73 99 80 75  Resp: 18 18 19 18   Temp: 98.4 F (36.9 C)  99 F (37.2 C) 99.3 F (37.4 C) 98.8 F (37.1 C)  TempSrc: Axillary Oral Oral Oral  SpO2: 99% 100% 100% 95%  Weight:      Height:        Intake/Output Summary (Last  24 hours) at 07/28/16 1304 Last data filed at 07/28/16 0544  Gross per 24 hour  Intake           749.17 ml  Output                0 ml  Net           749.17 ml   Filed Weights   07/22/16 1215  Weight: 84 kg (185 lb 3 oz)    Examination:  General exam: Afebrile, in no acute distress, denies CP, SOB and abdominal pain. Patient is oriented X2, calmed and w/o  signs of agitation. Tolerated CLD, is passing flatus and had BM's Respiratory system: Clear to auscultation. Respiratory effort normal. Cardiovascular system: S1 & S2 heard, RRR. No JVD, murmurs, rubs, gallops or clicks. No pedal edema. Gastrointestinal system: Abdomen is softer, non tender and with good BS presence. Central nervous system: Alert ,but confused.  Extremities: Symmetric 4 x 5 power, slightly spastic (from parkinson's disease). Skin: No rashes, Left pubic area with surgical wound from hernia repair.  Data Reviewed: I have personally reviewed following labs and imaging studies  CBC:  Recent Labs Lab 07/22/16 0640 07/23/16 0555 07/24/16 0605 07/27/16 0641 07/28/16 0540  WBC 12.3* 8.6 8.9 6.2 6.0  HGB 10.2* 9.6* 10.0* 8.2* 8.3*  HCT 30.7* 30.3* 30.8* 26.6* 25.2*  MCV 88.7 90.4 90.1 89.9 89.7  PLT 123* 134* 148* 162 161   Basic Metabolic Panel:  Recent Labs Lab 07/22/16 0640 07/23/16 0555 07/24/16 0605 07/27/16 0641 07/28/16 0540  NA 142 146* 147* 147* 141  K 4.2 3.4* 3.4* 3.7 3.7  CL 108 111 114* 116* 111  CO2 23 27 25 25 25   GLUCOSE 157* 108* 114* 108* 107*  BUN 40* 36* 28* 26* 25*  CREATININE 1.55* 1.50* 1.42* 1.52* 1.37*  CALCIUM 9.0 8.9 8.9 8.4* 8.2*  MG  --  2.0  --   --   --    GFR: Estimated Creatinine Clearance: 42.4 mL/min (by C-G formula based on SCr of 1.37 mg/dL (H)).   Liver Function Tests:  Recent Labs Lab 07/22/16 0640 07/27/16 0641  AST 24 21  ALT 22 6*  ALKPHOS 51 50  BILITOT 0.8 0.8  PROT 6.8 6.5  ALBUMIN 3.8 3.2*   CBG:  Recent Labs Lab 07/22/16 1644   GLUCAP 101*   Sepsis Labs:  Recent Labs Lab 07/21/16 1605  LATICACIDVEN 0.84    Recent Results (from the past 240 hour(s))  Culture, blood (Routine X 2) w Reflex to ID Panel     Status: None   Collection Time: 07/22/16 10:44 AM  Result Value Ref Range Status   Specimen Description BLOOD LEFT ARM  Final   Special Requests BOTTLES DRAWN AEROBIC AND ANAEROBIC 7CC  Final   Culture   Final    NO GROWTH 5 DAYS Performed at [facility], [address] [place], [place] [address]    Report Status 07/27/2016 FINAL  Final  Culture, blood (Routine X 2) w Reflex to ID Panel     Status: None   Collection Time: 07/22/16 10:44 AM  Result Value Ref Range Status   Specimen Description BLOOD LEFT HAND  Final   Special Requests BOTTLES  DRAWN AEROBIC AND ANAEROBIC 10CC  Final   Culture   Final    NO GROWTH 5 DAYS Performed at [facility], [address], [place], [place] [address]    Report Status 07/27/2016 FINAL  Final      Radiology Studies: No results found.  Scheduled Meds: . calcium-vitamin D  2 tablet Oral Daily  . carbidopa-levodopa  2 tablet Oral TID  . cloNIDine  0.1 mg Transdermal Weekly  . feeding supplement  1 Container Oral TID BM  . folic acid  1 mg Oral Daily  . heparin  5,000 Units Subcutaneous Q8H  . metoprolol  25 mg Oral BID  . pantoprazole (PROTONIX) IV  40 mg Intravenous QHS   Continuous Infusions: . dextrose 5 % and 0.45 % NaCl with KCl 20 mEq/L 50 mL/hr at 07/28/16 0544     LOS: 7 days    Time spent: 25 minutes.     Vassie Loll, MD [facility] Pager [phone]   If 7PM-7AM, please contact night-coverage [email or web address] Password [email or web address] 07/28/2016, 1:04 PM

## 2016-07-29 ENCOUNTER — Encounter (HOSPITAL_COMMUNITY): Payer: Self-pay | Admitting: Surgery

## 2016-07-29 DIAGNOSIS — R110 Nausea: Secondary | ICD-10-CM | POA: Diagnosis not present

## 2016-07-29 DIAGNOSIS — N183 Chronic kidney disease, stage 3 (moderate): Secondary | ICD-10-CM | POA: Diagnosis not present

## 2016-07-29 DIAGNOSIS — R5381 Other malaise: Secondary | ICD-10-CM | POA: Diagnosis not present

## 2016-07-29 DIAGNOSIS — M6281 Muscle weakness (generalized): Secondary | ICD-10-CM | POA: Diagnosis not present

## 2016-07-29 DIAGNOSIS — R2689 Other abnormalities of gait and mobility: Secondary | ICD-10-CM | POA: Diagnosis not present

## 2016-07-29 DIAGNOSIS — I509 Heart failure, unspecified: Secondary | ICD-10-CM

## 2016-07-29 DIAGNOSIS — R100 Acute abdomen: Secondary | ICD-10-CM | POA: Diagnosis not present

## 2016-07-29 DIAGNOSIS — K56609 Unspecified intestinal obstruction, unspecified as to partial versus complete obstruction: Secondary | ICD-10-CM | POA: Diagnosis not present

## 2016-07-29 DIAGNOSIS — K219 Gastro-esophageal reflux disease without esophagitis: Secondary | ICD-10-CM | POA: Diagnosis not present

## 2016-07-29 DIAGNOSIS — E785 Hyperlipidemia, unspecified: Secondary | ICD-10-CM | POA: Diagnosis not present

## 2016-07-29 DIAGNOSIS — N4400 Torsion of testis, unspecified: Secondary | ICD-10-CM | POA: Diagnosis not present

## 2016-07-29 DIAGNOSIS — I10 Essential (primary) hypertension: Secondary | ICD-10-CM | POA: Diagnosis not present

## 2016-07-29 DIAGNOSIS — R1110 Vomiting, unspecified: Secondary | ICD-10-CM | POA: Diagnosis not present

## 2016-07-29 DIAGNOSIS — D649 Anemia, unspecified: Secondary | ICD-10-CM | POA: Diagnosis not present

## 2016-07-29 DIAGNOSIS — D72829 Elevated white blood cell count, unspecified: Secondary | ICD-10-CM | POA: Diagnosis not present

## 2016-07-29 DIAGNOSIS — G20 Parkinson's disease: Secondary | ICD-10-CM | POA: Diagnosis not present

## 2016-07-29 DIAGNOSIS — K5649 Other impaction of intestine: Secondary | ICD-10-CM | POA: Diagnosis not present

## 2016-07-29 DIAGNOSIS — K460 Unspecified abdominal hernia with obstruction, without gangrene: Secondary | ICD-10-CM | POA: Diagnosis not present

## 2016-07-29 DIAGNOSIS — Z9889 Other specified postprocedural states: Secondary | ICD-10-CM | POA: Diagnosis not present

## 2016-07-29 MED ORDER — BOOST / RESOURCE BREEZE PO LIQD: 1.0000 | Freq: Three times a day (TID) | ORAL | Status: DC

## 2016-07-29 MED ORDER — CLONIDINE HCL 0.1 MG/24HR TD PTWK
0.1000 mg | MEDICATED_PATCH | TRANSDERMAL | 12 refills | Status: AC
Start: 2016-08-03 — End: ?

## 2016-07-29 MED ORDER — TRAMADOL HCL 50 MG PO TABS: 50.0000 mg | ORAL_TABLET | Freq: Three times a day (TID) | ORAL | 0 refills | Status: DC | PRN

## 2016-07-29 MED ORDER — PANTOPRAZOLE SODIUM 40 MG PO TBEC: 40.0000 mg | DELAYED_RELEASE_TABLET | Freq: Every day | ORAL | Status: DC

## 2016-07-29 MED ORDER — DOCUSATE SODIUM 50 MG PO CAPS: 50.0000 mg | ORAL_CAPSULE | Freq: Two times a day (BID) | ORAL | Status: DC

## 2016-07-29 MED ORDER — POLYETHYLENE GLYCOL 3350 17 G PO PACK: 17.0000 g | PACK | Freq: Every day | ORAL | Status: DC | PRN

## 2016-07-29 MED ORDER — ACETAMINOPHEN 325 MG PO TABS: 650.0000 mg | ORAL_TABLET | Freq: Four times a day (QID) | ORAL | Status: DC | PRN

## 2016-07-29 NOTE — Progress Notes (Signed)
Nutrition Follow-up  DOCUMENTATION CODES:   Not applicable  INTERVENTION:   -Continue Boost Breeze po TID, each supplement provides 250 kcal and 9 grams of protein  NUTRITION DIAGNOSIS:   Inadequate oral intake related to lethargy/confusion, altered GI function as evidenced by NPO status, severe depletion of muscle mass.  Progressing  GOAL:   Patient will meet greater than or equal to 90% of their needs  Met  MONITOR:   PO intake, Supplement acceptance, Labs, Weight trends, Skin, I & O's  REASON FOR ASSESSMENT:   Malnutrition Screening Tool    ASSESSMENT:   83 y.o. male with medical history significant of parkinson's disease, CKD III, who presents with severe and persistent nausea and vomiting. Diagnosed with incarcerated inguinal hernia with bowel obstruction  S/p Procedure(s)07/26/16: HERNIA REPAIR INGUINAL ADULT (Left)   1/25- NGT d/c  1/28- advance to full liquid diet  Pt sleeping soundly at time of visit.   Pt advanced to a soft diet this morning. Pt continues to have a good appetite; noted 60-100% of meal completion. Pt is also taking Boost Breeze supplements well, per MAR. Boost Breeze supplements on discharge orders; will recommend continuing supplements due to increased nutrient needs for rehab participation.   Per MD notes, plan to d/c for SNF today.   Labs reviewed.   Diet Order:  DIET SOFT Room service appropriate? Yes; Fluid consistency: Thin Diet - low sodium heart healthy  Skin:  Reviewed, no issues  Last BM:  07/29/16  Height:   Ht Readings from Last 1 Encounters:  07/22/16 5' 8"  (1.727 m)    Weight:   Wt Readings from Last 1 Encounters:  07/22/16 185 lb 3 oz (84 kg)    Ideal Body Weight:  70 kg  BMI:  Body mass index is 28.16 kg/m.  Estimated Nutritional Needs:   Kcal:  1700-1900kcal/day   Protein:  101-117g/day   Fluid:  >1.7L/day   EDUCATION NEEDS:   No education needs identified at this time  Peighton Edgin A. Jimmye Norman,  RD, LDN, CDE Pager: [phone] After hours Pager: [phone]

## 2016-07-29 NOTE — Progress Notes (Signed)
Pt transferred to [facility]. Report given to TurkeyVictoria, LPN. Pt belongings sent with the pt. Pt family members were informed pt would be transferring. No questions or concerns at this time.  Keonia Pasko W Kellie Chisolm, RN

## 2016-07-29 NOTE — Clinical Social Work Placement (Signed)
   CLINICAL SOCIAL WORK PLACEMENT  NOTE  Date:  07/29/2016  Patient Details  Name: Kevin ChimesJames H Amores MRN: [id number] Date of Birth: 12/23/1929  Clinical Social Work is seeking post-discharge placement for this patient at the Skilled  Nursing Facility level of care (*CSW will initial, date and re-position this form in  chart as items are completed):  Yes   Patient/family provided with [place] Clinical Social Work Department's list of facilities offering this level of care within the geographic area requested by the patient (or if unable, by the patient's family).  Yes   Patient/family informed of their freedom to choose among providers that offer the needed level of care, that participate in Medicare, Medicaid or managed care program needed by the patient, have an available bed and are willing to accept the patient.  Yes   Patient/family informed of [facility]'s ownership interest in [facility] and [facility], as well as of the fact that they are under no obligation to receive care at these facilities.  PASRR submitted to EDS on       PASRR number received on       Existing PASRR number confirmed on 07/29/16     FL2 transmitted to all facilities in geographic area requested by pt/family on 07/29/16     FL2 transmitted to all facilities within larger geographic area on 07/29/16     Patient informed that his/her managed care company has contracts with or will negotiate with certain facilities, including the following:  [facility]     Yes   Patient/family informed of bed offers received.  [facility]     Physician recommends and patient chooses bed at [facility]    Patient to be [facility] to [facility] on 07/29/16.  Patient to be transferred to facility by [facility]     Patient family notified on 07/29/16 of transfer.  Name of family member notified:  Spouse     PHYSICIAN Please prepare priority discharge  summary, including medications     Additional Comment:    _______________________________________________ Clearance CootsNicole A Joeanna Howdyshell, LCSW 07/29/2016, 12:58 PM

## 2016-07-29 NOTE — [place] FL2 (Signed)
[place] MEDICAID FL2 LEVEL OF CARE SCREENING TOOL     IDENTIFICATION  Patient Name: Kevin Roman Birthdate: 06-28-1930 Sex: male Admission Date (Current Location): 07/21/2016  [facility] [place] Number:      Facility and Address:  [facility],  [address] [place] [address]      Provider Number: [id number]  Attending Physician Name and Address:  Vassie Loll, MD  Relative Name and Phone Number:       Current Level of Care: Hospital Recommended Level of Care: Skilled Nursing Facility Prior Approval Number:    Date Approved/Denied:   PASRR Number:   [id number]    Discharge Plan: SNF    Current Diagnoses: Patient Active Problem List   Diagnosis Date Noted  . SBO (small bowel obstruction) 07/21/2016  . Incarcerated hernia 07/21/2016  . Acute renal failure (HCC) 11/28/2011  . Palpitations 11/26/2011  . HTN (hypertension) 11/26/2011  . Parkinson's disease (HCC) 11/26/2011  . Hypercholesteremia 11/26/2011  . Anemia 11/26/2011    Orientation RESPIRATION BLADDER Height & Weight     Self, Place  Normal Incontinent Weight: 185 lb 3 oz (84 kg) (per pt reports ) Height:  5\' 8"  (172.7 cm)  BEHAVIORAL SYMPTOMS/MOOD NEUROLOGICAL BOWEL NUTRITION STATUS      Incontinent Diet (Soft Diet)  AMBULATORY STATUS COMMUNICATION OF NEEDS Skin   Extensive Assist Verbally Other (Comment) (Surgical Incision )                       Personal Care Assistance Level of Assistance  Bathing, Feeding, Dressing Bathing Assistance: Maximum assistance Feeding assistance: Limited assistance Dressing Assistance: Maximum assistance     Functional Limitations Info  Sight, Hearing, Speech Sight Info: Impaired Hearing Info: Adequate Speech Info: Adequate    SPECIAL CARE FACTORS FREQUENCY  PT (By licensed PT)     PT Frequency: 5              Contractures Contractures Info: Present    Additional Factors Info  Code Status, Allergies Code Status Info:  Fullcode Allergies Info: No Known Allergies           Current Medications (07/29/2016):  This is the current hospital active medication list Current Facility-Administered Medications  Medication Dose Route Frequency Provider Last Rate Last Dose  . acetaminophen (TYLENOL) tablet 650 mg  650 mg Oral Q6H PRN Alexis Hugelmeyer, DO   650 mg at 07/28/16 2131  . calcium-vitamin D (OSCAL WITH D) 500-200 MG-UNIT per tablet 2 tablet  2 tablet Oral Daily Vassie Loll, MD   2 tablet at 07/29/16 1022  . carbidopa-levodopa (SINEMET IR) 25-100 MG per tablet immediate release 2 tablet  2 tablet Oral TID Vassie Loll, MD   2 tablet at 07/29/16 1023  . cloNIDine (CATAPRES - Dosed in mg/24 hr) patch 0.1 mg  0.1 mg Transdermal Weekly Vassie Loll, MD   0.1 mg at 07/27/16 2214  . dextrose 5 % and 0.45 % NaCl with KCl 20 mEq/L infusion   Intravenous Continuous Vassie Loll, MD 50 mL/hr at 07/28/16 2131    . feeding supplement (BOOST / RESOURCE BREEZE) liquid 1 Container  1 Container Oral TID BM Vassie Loll, MD   1 Container at 07/29/16 1025  . folic acid (FOLVITE) tablet 1 mg  1 mg Oral Daily Vassie Loll, MD   1 mg at 07/29/16 1023  . heparin injection 5,000 Units  5,000 Units Subcutaneous Q8H Luretha Murphy, MD   5,000 Units at 07/29/16 0519  .  hydrALAZINE (APRESOLINE) injection 10 mg  10 mg Intravenous Q6H PRN Vassie Lollarlos Madera, MD      . metoprolol tartrate (LOPRESSOR) tablet 25 mg  25 mg Oral BID Vassie Lollarlos Madera, MD   25 mg at 07/29/16 1022  . morphine 2 MG/ML injection 1 mg  1 mg Intravenous Q1H PRN Luretha MurphyMatthew Martin, MD      . ondansetron (ZOFRAN-ODT) disintegrating tablet 4 mg  4 mg Oral Q6H PRN Luretha MurphyMatthew Martin, MD       Or  . ondansetron [facility](ZOFRAN) injection 4 mg  4 mg Intravenous Q6H PRN Luretha MurphyMatthew Martin, MD      . pantoprazole (PROTONIX) injection 40 mg  40 mg Intravenous QHS Luretha MurphyMatthew Martin, MD   40 mg at 07/28/16 2131  . polyvinyl alcohol (LIQUIFILM TEARS) 1.4 % ophthalmic solution 1 drop  1 drop Both Eyes  TID PRN Vassie Lollarlos Madera, MD         Discharge Medications: Please see discharge summary for a list of discharge medications.  Relevant Imaging Results:  Relevant Lab Results:   Additional Information ssn#[id number]  Clearance CootsNicole A Nikala Walsworth, LCSW

## 2016-07-29 NOTE — Progress Notes (Signed)
PHARMACIST - PHYSICIAN COMMUNICATION  DR:   Gwenlyn PerkingMadera  CONCERNING: IV to Oral Route Change Policy  RECOMMENDATION: This patient is receiving pantoprazole by the intravenous route.  Based on criteria approved by the Pharmacy and Therapeutics Committee, the intravenous medication(s) is/are being converted to the equivalent oral dose form(s).   DESCRIPTION: These criteria include:  The patient is eating (either orally or via tube) and/or has been taking other orally administered medications for a least 24 hours  The patient has no evidence of active gastrointestinal bleeding or impaired GI absorption (gastrectomy, short bowel, patient on TNA or NPO).  If you have questions about this conversion, please contact the Pharmacy Department  []   [phone]( [phone] )  [facility] []   [phone]( [phone] )  [facility] []   [phone]( [phone] )  [facility] []   [phone]( [phone] )  [facility] [x]   [phone]( [phone] )  [facility]   Juliette AlcideZeigler, Trevonte Ashkar GraylingGeorge, [facility] CampusRPH 07/29/2016 10:59 AM

## 2016-07-29 NOTE — Op Note (Signed)
NAMKary Kos:  Dolliver, Natale                 ACCOUNT NO.:  [id number]  MEDICAL RECORD NO.:  [id number]  LOCATION:                                 FACILITY:  PHYSICIAN:  Thornton ParkMatthew B. Daphine DeutscherMartin, MD  DATE OF BIRTH:  05-29-1930  DATE OF PROCEDURE:  07/26/2016 DATE OF DISCHARGE:                              OPERATIVE REPORT   PREOPERATIVE DIAGNOSIS:  Left scrotal hernia with history of admission for small bowel obstruction.  PROCEDURE:  Left inguinal herniorrhaphy with high ligation of sac, repair of floor with imbrication of transversalis fascia, and repair of floor with a piece of Marlex type mesh, cut to fit.  SURGEON:  Thornton ParkMatthew B. Daphine DeutscherMartin, MD  ANESTHESIA:  General endotracheal.  PROCEDURE DESCRIPTION:  [facility]  The left-side had been correctly marked by the patient by me and the area including his penis where we had taken off his condom catheter, was prepped widely with Techni-Care and draped sterilely.  After a time- out, an oblique incision was made in the left inguinal region, carried down through the fatty tissue to open the external oblique.  I mobilized the cord.  The hernia had been reduced and went proximally and freed this and opened the thickened cremasteric fibers because this was a chronic large hernia.  I got into the sac and he had a fair amount of straw-colored ascites in the abdomen and this was evacuated.  I then took my time to get around the sac itself and disconnect that and then found a portion of probably the colon well up inside that was possibly part of a slider, but I kept that all into the abdomen.  I then closed this proximal sac with a 2-0 Vicryl in a pursestring fashion, tucked it inside.  With inside, I then incised the floor and did a repair of transversalis fascia with a 2-0 Prolene, imbricating the fascia and then tightening it up and this significantly tightened up the internal ring. The floor was then repaired further  with a piece of Marlex mesh cut to fit, sutured along the inguinal ligament with a 2-0 Prolene and medially to the internal oblique with running 2-0 Prolene.  It was sutured to itself laterally and tucked beneath the external oblique.  I irrigated with a L of saline.  I then injected the area with 20 mL of Exparel.  I closed the external oblique with running 2-0 Vicryl and then closed the wound in layers with 4-0 Vicryl and with Dermabond on the skin.  The patient seemed to tolerate the procedure well, and he was taken to the recovery room in satisfactory condition.    Thornton ParkMatthew B. Daphine DeutscherMartin, MD   ______________________________ Thornton ParkMatthew B. Daphine DeutscherMartin, MD   MBM/MEDQ  D:  07/26/2016  T:  07/27/2016  Job:  [id number]

## 2016-07-29 NOTE — Progress Notes (Signed)
Patient ID: Kevin ChimesJames H Stege, male   DOB: 03/11/1930, 83 y.o.   MRN: [id number] 3 Days Post-Op   Subjective: No complaints this morning. Denies abdominal pain or nausea. Tolerating full liquids. Two bowel movements yesterday.  Objective: Vital signs in last 24 hours: Temp:  [98.7 F (37.1 C)-100.4 F (38 C)] 98.7 F (37.1 C) (01/29 0358) Pulse Rate:  [69-81] 69 (01/29 0358) Resp:  [18-19] 19 (01/29 0358) BP: (133-148)/(58-88) 148/88 (01/29 0358) SpO2:  [97 %-100 %] 100 % (01/29 0358) Last BM Date: 07/28/16  Intake/Output from previous day: 01/28 0701 - 01/29 0700 In: 2205 [P.O.:1360; I.V.:845] Out: -  Intake/Output this shift: No intake/output data recorded.  Alert and cooperative, answers questions appropriately Unlabored respirations Abdomen soft, mildly distended, nontender Incision c/d/i with dermabond, no erythema or sigh of infection  Lab Results:   Recent Labs  07/27/16 0641 07/28/16 0540  WBC 6.2 6.0  HGB 8.2* 8.3*  HCT 26.6* 25.2*  PLT 162 161   BMET  Recent Labs  07/27/16 0641 07/28/16 0540  NA 147* 141  K 3.7 3.7  CL 116* 111  CO2 25 25  GLUCOSE 108* 107*  BUN 26* 25*  CREATININE 1.52* 1.37*  CALCIUM 8.4* 8.2*     Studies/Results: No results found.  Anti-infectives: Anti-infectives    Start     Dose/Rate Route Frequency Ordered Stop   07/26/16 1400  piperacillin-tazobactam (ZOSYN) IVPB 3.375 g  Status:  Discontinued     3.375 g 100 mL/hr over 30 Minutes Intravenous Every 8 hours 07/26/16 1123 07/26/16 1124   07/26/16 1400  piperacillin-tazobactam (ZOSYN) IVPB 3.375 g  Status:  Discontinued     3.375 g 12.5 mL/hr over 240 Minutes Intravenous  Once 07/26/16 1125 07/26/16 1811   07/22/16 1800  piperacillin-tazobactam (ZOSYN) IVPB 3.375 g  Status:  Discontinued     3.375 g 12.5 mL/hr over 240 Minutes Intravenous Every 8 hours 07/22/16 1730 07/26/16 1123   07/21/16 1530  piperacillin-tazobactam (ZOSYN) IVPB 3.375 g     3.375 g 12.5 mL/hr  over 240 Minutes Intravenous  Once 07/21/16 1527 07/21/16 1955      Assessment/Plan: s/p Procedure(s): HERNIA REPAIR INGUINAL ADULT Doing well. Has good bowel function and abdomen is softer. Advance to soft diet. Renal function improving. Physical therapy working with patient and likely will require SNF. From surgery standpoint he is ok for discharge once SNF available.   LOS: 8 days    Berna BueChelsea A Jennie Bolar 07/29/2016

## 2016-07-29 NOTE — Discharge Summary (Signed)
Physician Discharge Summary  ORRY SIGL ZOX:[id number] DOB: 13-Oct-1929 DOA: 07/21/2016  PCP: No PCP Per Patient  Admit date: 07/21/2016 Discharge date: 07/29/2016  Time spent: 35 minutes  Recommendations for Outpatient Follow-up:  Soft diet and advance further to regular consistency in 1 week if otherwise well tolerated  Maintain adequate hydration  Follow up with general surgery as instructed Follow heart healthy diet  (sodium intake < 2.5 gram daily) Repeat BMET in 5 days to follow renal function and electrolytes Repeat CBC in 5 days to follow Hgb and WBC's trend    Discharge Diagnoses:  Active Problems:   HTN (hypertension)   Parkinson's disease (HCC)   Hypercholesteremia   Anemia   SBO (small bowel obstruction)   Incarcerated hernia   Chronic renal failure in pediatric patient, stage 3 (moderate)   Discharge Condition: stable and improved. Discharge to SNF for physical rehabilitation and further care. Patient will follow up with general surgery in 2-3 weeks as an outpatient   Diet recommendation: heart healthy diet (low sodium). Will need soft diet for the next 5 days and advance to regular consistency as tolerated.  Filed Weights   07/22/16 1215  Weight: 84 kg (185 lb 3 oz)    History of present illness:  As per Dr. Ella Jubilee H&P written on 07/21/16 83 y.o. male with medical history significant of parkinson's disease who presents with severe and persistent nausea and vomiting. Early this am patient started vomiting multiple times, associated with nausea but no abdominal pain, no improving or worsening factors. Emesis was noted to be dark as well as stools, patient's family called the [place] and advised to call EMS.  Patient had self resolved nausea and vomiting 7 days prior to admission.    Hospital Course:  SBO from incarcerated inguinal hernia; -NGT discontinued on 07/25/16 -patient tolerated CLD; which was stopped on 1/26 for left hernia repair surgery -surgery was  completed w/o apparent complications and patient has tolerated up to soft diet. Plan is to continue advancing after the next 5 days if he demonstrated good tolerance.  -per [facility] has remained stable and tolerating diet, ready for SNF discharge. -PRN pain meds and good bowel regimen  Physical deconditioning  -PT has evaluated patient and recommending SNF short term for rehab.  in 1-2 days. SW aware.  Leukocytosis, afebrile for 48 hours now, blood cultures done and neg so far  -will discontinue zosyn and follow patient off abx's -no signs of infection in repeated UA -WBC's back to normal  Anemia: of chronic disease  -no transfusion needed at this point, imagine some blood loss with surgery -will assess hgb trend, no transfusion needed currently -Hgb 8.3 at discharge   CKD stage 3: baseline creatinine between 1.3 to 1.5.  -Currently at baseline at 1.42---1.52---1.37  -will Monitor trend.  -providing maintenance fluids, while having difficulties tolerating PO's.  Hypokalemia, hypernatremia:  -Due to prior NGT suctions and poor intake -will continue IVF's with potassium supplementation until PO intake is tolerated.  -clear liquid diet resume now after surgery, will advance as tolerated  -will follow electrolytes trend and replete them as needed   parkinson's disease:  -continue carbidopa   Hypertension:  -will continue metoprolol, transdermal clonidine and low sodium diet  Agitation/delirium -stable and resolved now -patient required haldol and restrains during this hospitalization initially   Procedures:  See below for x-ray reports   Left Inguinal hernia repair 1/26   Consultations:  CCS   Discharge Exam: Vitals:   07/29/16 0100  07/29/16 0358  BP:  (!) 148/88  Pulse:  69  Resp:  19  Temp: 99.2 F (37.3 C) 98.7 F (37.1 C)   General exam: Afebrile, in no acute distress, denies CP, SOB and abdominal pain. Patient is oriented X2, calmed and w/o  signs of  agitation. Tolerated CLD, is passing flatus and had BM's Respiratory system: Clear to auscultation. Respiratory effort normal. Cardiovascular system: S1 & S2 heard, RRR. No JVD, murmurs, rubs, gallops or clicks. No pedal edema. Gastrointestinal system: Abdomen is softer, non tender and with good BS presence. Central nervous system: Alert ,but confused.  Extremities: Symmetric 4 x 5 power, slightly spastic (from parkinson's disease). Skin: No rashes, Left pubic area with surgical wound from hernia repair.    Discharge Instructions   Discharge Instructions    Diet - low sodium heart healthy    Complete by:  As directed    Discharge instructions    Complete by:  As directed    Soft diet and advance further to regular consistency in 1 week if otherwise well tolerated  Maintain adequate hydration  Follow up with general surgery as instructed Physical therapy as per SNF protocol Follow up with PCP in 2 weeks Follow heart healthy diet  (sodium intake < 2.5 gram daily) BMET in 5 days to follow renal function and electrolytes     Current Discharge Medication List    START taking these medications   Details  acetaminophen (TYLENOL) 325 MG tablet Take 2 tablets (650 mg total) by mouth every 6 (six) hours as needed for mild pain, fever or headache.    cloNIDine (CATAPRES - DOSED IN MG/24 HR) 0.1 mg/24hr patch Place 1 patch (0.1 mg total) onto the skin once a week. Qty: 4 patch, Refills: 12    docusate sodium (COLACE) 50 MG capsule Take 1 capsule (50 mg total) by mouth 2 (two) times daily. Hold for diarrhea    feeding supplement (BOOST / RESOURCE BREEZE) LIQD Take 1 Container by mouth 3 (three) times daily between meals.    pantoprazole (PROTONIX) 40 MG tablet Take 1 tablet (40 mg total) by mouth at bedtime.    polyethylene glycol (MIRALAX / GLYCOLAX) packet Take 17 g by mouth daily as needed for mild constipation.    traMADol (ULTRAM) 50 MG tablet Take 1 tablet (50 mg total) by mouth  every 8 (eight) hours as needed for severe pain. Qty: 20 tablet, Refills: 0      CONTINUE these medications which have NOT CHANGED   Details  Calcium Carbonate-Vitamin D3 (CALCIUM 600/VITAMIN D) 600-400 MG-UNIT TABS Take 2 tablets by mouth daily.    carbidopa-levodopa (SINEMET IR) 25-100 MG per tablet Take 2 tablets by mouth 3 (three) times daily.    folic acid (FOLVITE) 1 MG tablet Take 1 mg by mouth daily.    hydroxypropyl methylcellulose (ISOPTO TEARS) 2.5 % ophthalmic solution Place 1 drop into both eyes 3 (three) times daily as needed. For dry eyes    metoprolol (LOPRESSOR) 50 MG tablet Take 25 mg by mouth 2 (two) times daily.      STOP taking these medications     cloNIDine (CATAPRES) 0.1 MG tablet      furosemide (LASIX) 20 MG tablet        No Known Allergies Follow-up Information    MARTIN,MATTHEW B, MD. Schedule an appointment as soon as possible for a visit.   Specialty:  General Surgery Why:  2-3 weeks Contact information: [address]  [place] [address] [phone]           The results of significant diagnostics from this hospitalization (including imaging, microbiology, ancillary and laboratory) are listed below for reference.    Significant Diagnostic Studies: Dg Abd 1 View  Result Date: 07/24/2016 CLINICAL DATA:  Small bowel obstruction, followup EXAM: ABDOMEN - 1 VIEW COMPARISON:  Portable exam 0842 hours compared 07/23/2016 FINDINGS: Nasogastric tube coiled in proximal stomach. Retained contrast present in colon from mid transverse to sigmoid colon. Small bowel gas pattern appears normal. Surgical clips and phleboliths in pelvis. No definite bowel wall thickening or acute osseous findings. IMPRESSION: Retained contrast in colon without small bowel dilatation. Electronically Signed   By: Ulyses Southward M.D.   On: 07/24/2016 09:14   Dg Abd 1 View  Result Date: 07/22/2016 CLINICAL DATA:  NG tube placement. EXAM: ABDOMEN - 1 VIEW COMPARISON:   07/22/2016. FINDINGS: NG tube noted with tip a gastroesophageal junction. Advancement of approximately 10 cm should be considered. Soft tissue structures are unremarkable. Distended loops of small bowel are noted. Bowel distention of the 5.1 cm noted. Findings consistent with small-bowel obstruction. IMPRESSION: 1. NG tube tip noted at the gastroesophageal junction. Advancement of approximately 10 cm should be considered. 2. Persistent small bowel distention with small-bowel distention of the 5.1 cm. Findings suggest small bowel obstruction. Follow-up exam to demonstrate clearing suggested . Electronically Signed   By: Maisie Fus  Register   On: 07/22/2016 11:07   Dg Abd 1 View  Result Date: 07/22/2016 CLINICAL DATA:  83 year old male.  Evaluate for NG tube positioning. EXAM: ABDOMEN - 1 VIEW COMPARISON:  Abdominal radiograph dated 07/22/2016 FINDINGS: An enteric tube is visualized, relatively stable positioning. The side port of the enteric tube appears to be above the gastroesophageal junction and the tip of the tube is likely within the proximal stomach. Recommend advancing the tube into the stomach. There are bibasilar atelectatic changes versus infiltrate. IMPRESSION: Enteric tube in similar position as the prior radiograph with side-port likely above the GE junction. Recommend advancing into the stomach. Electronically Signed   By: Elgie Collard M.D.   On: 07/22/2016 06:18   US Scrotum  Result Date: 07/21/2016 CLINICAL DATA:  Right testicular pain for 2 weeks. EXAM: SCROTAL ULTRASOUND DOPPLER ULTRASOUND OF THE TESTICLES TECHNIQUE: Complete ultrasound examination of the testicles, epididymis, and other scrotal structures was performed. Color and spectral Doppler ultrasound were also utilized to evaluate blood flow to the testicles. COMPARISON:  None. FINDINGS: Right testicle Measurements: 3.5 x 1.8 x 2.1 cm. No mass or microlithiasis visualized. Striated bilateral testicles, right greater than left,  without a focal mass likely reflecting prior orchitis for fibrosis. Left testicle Measurements: 3.5 x 1.9 x 2.4 cm. No mass or microlithiasis visualized. Striated bilateral testicles, right greater than left, without a focal mass likely reflecting prior orchitis for fibrosis Right epididymis: 2 cystic structures in the epididymal tail measuring 3 and 4 mm respectively likely reflecting epididymal cysts. Otherwise normal in size and appearance. Left epididymis:  Normal in size and appearance. Hydrocele:  Large left hydrocele.  Moderate right hydrocele. Varicocele:  None visualized. Pulsed Doppler interrogation of both testes demonstrates normal low resistance arterial and venous waveforms bilaterally. IMPRESSION: 1. No testicular torsion. 2. Electronically Signed   By: Elige Ko   On: 07/21/2016 14:06   Ct Abdomen Pelvis W Contrast  Result Date: 07/21/2016 CLINICAL DATA:  Her abdominal pain, dark stool for 1 week, vomiting this morning EXAM: CT ABDOMEN AND PELVIS WITH CONTRAST  TECHNIQUE: Multidetector CT imaging of the abdomen and pelvis was performed using the standard protocol following bolus administration of intravenous contrast. CONTRAST:  ISOVUE-300 IOPAMIDOL (ISOVUE-300) INJECTION 61% COMPARISON:  CT report 12/27/ 2000 no images available and scrotal ultrasound 07/21/2016 FINDINGS: Lower chest: Lung bases shows mild atelectasis or scarring left base anterolaterally. Hepatobiliary: There are indeterminate scattered low-density lesions within liver the largest in axial image 15 anterior aspect of left hepatic lobe consistent with a cyst. Given history of prostate cancer follow-up nonemergent enhanced MRI is recommended for further characterization. Pancreas: Enhanced pancreas with normal appearance without focal abnormality. Spleen: Normal appearance without focal abnormality. Adrenals/Urinary Tract: No adrenal gland mass. Enhanced kidneys are symmetrical in size. No hydronephrosis or hydroureter.  Delayed renal images shows bilateral renal symmetrical excretion. Bilateral visualized proximal ureter is unremarkable. The patient is status post post prostatectomy. Bilateral distal ureter is unremarkable. The urinary bladder is under distended. Stomach/Bowel: There is distension of the stomach with fluid and some air-fluid level. Significant small bowel distension with multiple air-fluid levels. There is a left inguinal scrotal canal hernia measures 6 by 7.5 cm containing moderate distended small bowel lobe. The exiting loop from the hernia axial image 74 is small caliber. Findings are consistent with small bowel obstruction due to incarcerated left inguinal hernia. Small to moderate fluid/ hydrocele noted within lower aspect of the left inguinal scrotal hernia. There is a small right hydrocele. Postsurgical changes are noted within right colon/cecum. Moderate stool noted within right colon transverse colon descending colon and rectosigmoid colon. No evidence of distal colonic obstruction. Vascular/Lymphatic: Atherosclerotic mild atherosclerotic calcifications of abdominal aorta and iliac arteries. No aortic aneurysm. No adenopathy. Reproductive: The patient is status post post proctectomy. Bilateral distal ureter is unremarkable. No pelvic mass. Other: No ascites or free abdominal air. Musculoskeletal: Sagittal images of the spine shows no destructive bony lesions. Degenerative changes are noted lumbar spine. Degenerative changes bilateral SI joints and pubic symphysis. No destructive bony lesions are noted within pelvis. IMPRESSION: 1. There is distension of the stomach with fluid and some air-fluid level. Significant small bowel distension with multiple air-fluid levels. There is a left inguinal scrotal canal hernia measures 6 by 7.5 cm containing moderate distended small bowel lobe. The exiting loop from the hernia axial image 74 is small caliber. Findings are consistent with small bowel obstruction due to  incarcerated left inguinal hernia. 2. There are indeterminate scattered low-density lesions within liver. The largest in left hepatic lobe anteriorly measures 1.4 cm consistent with a cyst. Given history of prostate cancer follow-up examination nonemergent enhanced MRI is recommended for further evaluation. 3. No hydronephrosis or hydroureter. 4. Moderate fluid/hydrocele noted in inferior aspect of left inguinal scrotal canal hernia. Small left hydrocele noted in right lower scrotal canal. 5. Moderate stool noted within colon. Postsurgical changes are noted within cecum. 6. Degenerative changes lumbar spine SI joints and pubic symphysis. No destructive bony lesions are noted. These results were called by telephone at the time of interpretation on 07/21/2016 at 3:12 pm to Dr. Cheron Schaumann , who verbally acknowledged these results. Electronically Signed   By: Natasha Mead M.D.   On: 07/21/2016 15:13   [place] Art/ven Flow Abd Pelv Doppler  Result Date: 07/21/2016 CLINICAL DATA:  Right testicular pain for 2 weeks. EXAM: SCROTAL ULTRASOUND DOPPLER ULTRASOUND OF THE TESTICLES TECHNIQUE: Complete ultrasound examination of the testicles, epididymis, and other scrotal structures was performed. Color and spectral Doppler ultrasound were also utilized to evaluate blood flow to the testicles.  COMPARISON:  None. FINDINGS: Right testicle Measurements: 3.5 x 1.8 x 2.1 cm. No mass or microlithiasis visualized. Striated bilateral testicles, right greater than left, without a focal mass likely reflecting prior orchitis for fibrosis. Left testicle Measurements: 3.5 x 1.9 x 2.4 cm. No mass or microlithiasis visualized. Striated bilateral testicles, right greater than left, without a focal mass likely reflecting prior orchitis for fibrosis Right epididymis: 2 cystic structures in the epididymal tail measuring 3 and 4 mm respectively likely reflecting epididymal cysts. Otherwise normal in size and appearance. Left epididymis:  Normal in  size and appearance. Hydrocele:  Large left hydrocele.  Moderate right hydrocele. Varicocele:  None visualized. Pulsed Doppler interrogation of both testes demonstrates normal low resistance arterial and venous waveforms bilaterally. IMPRESSION: 1. No testicular torsion. 2. Electronically Signed   By: Elige Ko   On: 07/21/2016 14:06   Dg Abd Portable 1v-small Bowel Obstruction Protocol-initial, 8 Hr Delay  Result Date: 07/23/2016 CLINICAL DATA:  8 hours status post Gastrografin administration for small bowel obstruction workup. Initial encounter. EXAM: PORTABLE ABDOMEN - 1 VIEW COMPARISON:  Abdominal radiograph performed earlier today at 8:33 a.m. FINDINGS: Contrast has progressed into the colon. Previously noted distended small bowel loops are less apparent, likely reflecting mild dysmotility. There is no evidence of bowel obstruction at this time. The patient's enteric tube is noted ending overlying the fundus of the stomach. No acute osseous abnormalities are seen. Mild degenerative change is noted at the lower lumbar spine. Postoperative change is noted at the pelvis. IMPRESSION: Contrast has progressed into the colon. Previously noted distended small bowel loops are less apparent, likely reflecting mild dysmotility. No evidence of bowel obstruction at this time. No additional imaging is required per small bowel obstruction protocol. Electronically Signed   By: Roanna Raider M.D.   On: 07/23/2016 20:06   Dg Abd Portable 1v-small Bowel Protocol-position Verification  Result Date: 07/23/2016 CLINICAL DATA:  NG tube . EXAM: PORTABLE ABDOMEN - 1 VIEW COMPARISON:  07/23/2016. FINDINGS: NG tube noted coiled in the stomach. Distended loops of small bowel are again noted. Distention has improved from prior exam. Stool in the colon. No free air . No acute bony abnormalities. IMPRESSION: NG tube noted coiled stomach. Distended loops of small bowel are again noted. Distention has improved from prior exam .  Colon is nondistended. Stool noted in the colon. No free intraperitoneal air. Electronically Signed   By: Maisie Fus  Register   On: 07/23/2016 09:01   Dg Abd Portable 1v  Result Date: 07/23/2016 CLINICAL DATA:  Small bowel obstruction EXAM: PORTABLE ABDOMEN - 1 VIEW COMPARISON:  07/22/2016 FINDINGS: Stable NG tube. Disproportionally dilated loops of small bowel are improved. Colonic gas has increased. Postop changes over the pelvis. No obvious free intraperitoneal gas. IMPRESSION: Improving partial small bowel obstruction pattern. Electronically Signed   By: Jolaine Click M.D.   On: 07/23/2016 06:59   Dg Abd Portable 1v  Result Date: 07/22/2016 CLINICAL DATA:  83 year old male status post NG tube placement. EXAM: PORTABLE ABDOMEN - 1 VIEW COMPARISON:  Earlier radiograph dated 07/21/2016 FINDINGS: An enteric tube is partially visualized with tip in the proximal stomach and side port above the GE junction as seen on the prior radiograph. Recommend advancing of the tube into the stomach. There is persistent dilatation of small-bowel loops measuring up to 4.2 cm in the mid abdomen. There is moderate colonic stool burden. Multiple surgical clips noted within the pelvis. No acute osseous pathology. IMPRESSION: Enteric tube with tip in the  proximal stomach and side port above the GE junction similar to prior radiograph. Recommend advancing of the tube into the stomach. Persistent small bowel dilatation.  Follow-up recommended. Electronically Signed   By: Elgie Collard M.D.   On: 07/22/2016 03:25   Dg Abd Portable 1v  Result Date: 07/21/2016 CLINICAL DATA:  NG tube placement EXAM: PORTABLE ABDOMEN - 1 VIEW COMPARISON:  07/21/2016 FINDINGS: NG tube with tip in the stomach. Side port is abut the GE junction. Consider advancing by 6 to 7 cm. IMPRESSION: Feeding tube within the stomach. Side port above the GE junction as above. Electronically Signed   By: Genevive Bi M.D.   On: 07/21/2016 20:49   Dg Abd  Portable 1v  Result Date: 07/21/2016 CLINICAL DATA:  Unsuccessful NG tube placement. EXAM: PORTABLE ABDOMEN - 1 VIEW COMPARISON:  None. FINDINGS: Nasogastric tube with the tip just above the esophagogastric junction. There is no bowel dilatation to suggest obstruction. There is no evidence of pneumoperitoneum, portal venous gas or pneumatosis. There are no pathologic calcifications along the expected course of the ureters. The osseous structures are unremarkable. IMPRESSION: Nasogastric tube with the tip just above the esophagogastric junction. Recommend advancing the tube 15 cm. Electronically Signed   By: Elige Ko   On: 07/21/2016 17:44   Dg Basil Dess Tube Plc W/fl W/rad  Result Date: 07/22/2016 CLINICAL DATA:  Nasogastric tube placement under fluoroscopy. Unsuccessful attempts on the for. EXAM: NASO G TUBE PLACEMENT WITH FL AND WITH RAD CONTRAST:  None. FLUOROSCOPY TIME:  Fluoroscopy Time:  1 minutes 28 seconds. Radiation Exposure Index (if provided by the fluoroscopic device): 18.2 mGy Number of Acquired Spot Images: 0 COMPARISON:  None. FINDINGS: Nasogastric tube was advanced through the right nostril to just beyond the gastroesophageal junction. Due to redundancy in the hypopharynx, a stiff Amplatz wire was inserted into the nasogastric tube with successful advancement of the tube into the stomach. IMPRESSION: Successful nasogastric tube placement under fluoroscopy. Electronically Signed   By: Leanna Battles M.D.   On: 07/22/2016 16:16    Microbiology: Recent Results (from the past 240 hour(s))  Culture, blood (Routine X 2) w Reflex to ID Panel     Status: None   Collection Time: 07/22/16 10:44 AM  Result Value Ref Range Status   Specimen Description BLOOD LEFT ARM  Final   Special Requests BOTTLES DRAWN AEROBIC AND ANAEROBIC 7CC  Final   Culture   Final    NO GROWTH 5 DAYS Performed at [facility], [address] [place], [place] [address]    Report Status 07/27/2016 FINAL  Final   Culture, blood (Routine X 2) w Reflex to ID Panel     Status: None   Collection Time: 07/22/16 10:44 AM  Result Value Ref Range Status   Specimen Description BLOOD LEFT HAND  Final   Special Requests BOTTLES DRAWN AEROBIC AND ANAEROBIC 10CC  Final   Culture   Final    NO GROWTH 5 DAYS Performed at [facility], [address] [place], [place] [address]    Report Status 07/27/2016 FINAL  Final     Labs: Basic Metabolic Panel:  Recent Labs Lab 07/23/16 0555 07/24/16 0605 07/27/16 0641 07/28/16 0540  NA 146* 147* 147* 141  K 3.4* 3.4* 3.7 3.7  CL 111 114* 116* 111  CO2 27 25 25 25   GLUCOSE 108* 114* 108* 107*  BUN 36* 28* 26* 25*  CREATININE 1.50* 1.42* 1.52* 1.37*  CALCIUM 8.9  8.9 8.4* 8.2*  MG 2.0  --   --   --    Liver Function Tests:  Recent Labs Lab 07/27/16 0641  AST 21  ALT 6*  ALKPHOS 50  BILITOT 0.8  PROT 6.5  ALBUMIN 3.2*   CBC:  Recent Labs Lab 07/23/16 0555 07/24/16 0605 07/27/16 0641 07/28/16 0540  WBC 8.6 8.9 6.2 6.0  HGB 9.6* 10.0* 8.2* 8.3*  HCT 30.3* 30.8* 26.6* 25.2*  MCV 90.4 90.1 89.9 89.7  PLT 134* 148* 162 161   CBG:  Recent Labs Lab 07/22/16 1644  GLUCAP 101*    Signed:  Vassie Loll MD.  [facility] 07/29/2016, 12:33 PM

## 2016-08-02 DIAGNOSIS — R5381 Other malaise: Secondary | ICD-10-CM | POA: Diagnosis not present

## 2016-08-02 DIAGNOSIS — Z9889 Other specified postprocedural states: Secondary | ICD-10-CM | POA: Diagnosis not present

## 2016-08-02 DIAGNOSIS — G20 Parkinson's disease: Secondary | ICD-10-CM | POA: Diagnosis not present

## 2016-08-02 DIAGNOSIS — I10 Essential (primary) hypertension: Secondary | ICD-10-CM | POA: Diagnosis not present

## 2016-08-27 DIAGNOSIS — I10 Essential (primary) hypertension: Secondary | ICD-10-CM | POA: Diagnosis not present

## 2016-08-28 DIAGNOSIS — I10 Essential (primary) hypertension: Secondary | ICD-10-CM | POA: Diagnosis not present

## 2016-09-05 DIAGNOSIS — E785 Hyperlipidemia, unspecified: Secondary | ICD-10-CM | POA: Diagnosis not present

## 2016-09-05 DIAGNOSIS — I129 Hypertensive chronic kidney disease with stage 1 through stage 4 chronic kidney disease, or unspecified chronic kidney disease: Secondary | ICD-10-CM | POA: Diagnosis not present

## 2016-09-05 DIAGNOSIS — R600 Localized edema: Secondary | ICD-10-CM | POA: Diagnosis not present

## 2016-09-05 DIAGNOSIS — N183 Chronic kidney disease, stage 3 (moderate): Secondary | ICD-10-CM | POA: Diagnosis not present

## 2016-09-05 DIAGNOSIS — G20 Parkinson's disease: Secondary | ICD-10-CM | POA: Diagnosis not present

## 2016-09-05 DIAGNOSIS — Z48815 Encounter for surgical aftercare following surgery on the digestive system: Secondary | ICD-10-CM | POA: Diagnosis not present

## 2016-09-05 DIAGNOSIS — L89620 Pressure ulcer of left heel, unstageable: Secondary | ICD-10-CM | POA: Diagnosis not present

## 2016-09-05 DIAGNOSIS — D631 Anemia in chronic kidney disease: Secondary | ICD-10-CM | POA: Diagnosis not present

## 2016-09-07 DIAGNOSIS — Z48815 Encounter for surgical aftercare following surgery on the digestive system: Secondary | ICD-10-CM | POA: Diagnosis not present

## 2016-09-07 DIAGNOSIS — N183 Chronic kidney disease, stage 3 (moderate): Secondary | ICD-10-CM | POA: Diagnosis not present

## 2016-09-07 DIAGNOSIS — I129 Hypertensive chronic kidney disease with stage 1 through stage 4 chronic kidney disease, or unspecified chronic kidney disease: Secondary | ICD-10-CM | POA: Diagnosis not present

## 2016-09-07 DIAGNOSIS — R600 Localized edema: Secondary | ICD-10-CM | POA: Diagnosis not present

## 2016-09-07 DIAGNOSIS — G20 Parkinson's disease: Secondary | ICD-10-CM | POA: Diagnosis not present

## 2016-09-07 DIAGNOSIS — L89620 Pressure ulcer of left heel, unstageable: Secondary | ICD-10-CM | POA: Diagnosis not present

## 2016-09-07 DIAGNOSIS — E785 Hyperlipidemia, unspecified: Secondary | ICD-10-CM | POA: Diagnosis not present

## 2016-09-07 DIAGNOSIS — D631 Anemia in chronic kidney disease: Secondary | ICD-10-CM | POA: Diagnosis not present

## 2016-09-09 DIAGNOSIS — Z48815 Encounter for surgical aftercare following surgery on the digestive system: Secondary | ICD-10-CM | POA: Diagnosis not present

## 2016-09-09 DIAGNOSIS — E785 Hyperlipidemia, unspecified: Secondary | ICD-10-CM | POA: Diagnosis not present

## 2016-09-09 DIAGNOSIS — D631 Anemia in chronic kidney disease: Secondary | ICD-10-CM | POA: Diagnosis not present

## 2016-09-09 DIAGNOSIS — N183 Chronic kidney disease, stage 3 (moderate): Secondary | ICD-10-CM | POA: Diagnosis not present

## 2016-09-09 DIAGNOSIS — R600 Localized edema: Secondary | ICD-10-CM | POA: Diagnosis not present

## 2016-09-09 DIAGNOSIS — L89620 Pressure ulcer of left heel, unstageable: Secondary | ICD-10-CM | POA: Diagnosis not present

## 2016-09-09 DIAGNOSIS — I129 Hypertensive chronic kidney disease with stage 1 through stage 4 chronic kidney disease, or unspecified chronic kidney disease: Secondary | ICD-10-CM | POA: Diagnosis not present

## 2016-09-09 DIAGNOSIS — G20 Parkinson's disease: Secondary | ICD-10-CM | POA: Diagnosis not present

## 2016-09-11 DIAGNOSIS — D631 Anemia in chronic kidney disease: Secondary | ICD-10-CM | POA: Diagnosis not present

## 2016-09-11 DIAGNOSIS — G20 Parkinson's disease: Secondary | ICD-10-CM | POA: Diagnosis not present

## 2016-09-11 DIAGNOSIS — L89620 Pressure ulcer of left heel, unstageable: Secondary | ICD-10-CM | POA: Diagnosis not present

## 2016-09-11 DIAGNOSIS — Z48815 Encounter for surgical aftercare following surgery on the digestive system: Secondary | ICD-10-CM | POA: Diagnosis not present

## 2016-09-11 DIAGNOSIS — E785 Hyperlipidemia, unspecified: Secondary | ICD-10-CM | POA: Diagnosis not present

## 2016-09-11 DIAGNOSIS — I129 Hypertensive chronic kidney disease with stage 1 through stage 4 chronic kidney disease, or unspecified chronic kidney disease: Secondary | ICD-10-CM | POA: Diagnosis not present

## 2016-09-11 DIAGNOSIS — R600 Localized edema: Secondary | ICD-10-CM | POA: Diagnosis not present

## 2016-09-11 DIAGNOSIS — N183 Chronic kidney disease, stage 3 (moderate): Secondary | ICD-10-CM | POA: Diagnosis not present

## 2016-09-16 DIAGNOSIS — I129 Hypertensive chronic kidney disease with stage 1 through stage 4 chronic kidney disease, or unspecified chronic kidney disease: Secondary | ICD-10-CM | POA: Diagnosis not present

## 2016-09-16 DIAGNOSIS — E785 Hyperlipidemia, unspecified: Secondary | ICD-10-CM | POA: Diagnosis not present

## 2016-09-16 DIAGNOSIS — N183 Chronic kidney disease, stage 3 (moderate): Secondary | ICD-10-CM | POA: Diagnosis not present

## 2016-09-16 DIAGNOSIS — R600 Localized edema: Secondary | ICD-10-CM | POA: Diagnosis not present

## 2016-09-16 DIAGNOSIS — L89620 Pressure ulcer of left heel, unstageable: Secondary | ICD-10-CM | POA: Diagnosis not present

## 2016-09-16 DIAGNOSIS — G20 Parkinson's disease: Secondary | ICD-10-CM | POA: Diagnosis not present

## 2016-09-16 DIAGNOSIS — Z48815 Encounter for surgical aftercare following surgery on the digestive system: Secondary | ICD-10-CM | POA: Diagnosis not present

## 2016-09-16 DIAGNOSIS — D631 Anemia in chronic kidney disease: Secondary | ICD-10-CM | POA: Diagnosis not present

## 2016-09-18 DIAGNOSIS — Z48815 Encounter for surgical aftercare following surgery on the digestive system: Secondary | ICD-10-CM | POA: Diagnosis not present

## 2016-09-18 DIAGNOSIS — R600 Localized edema: Secondary | ICD-10-CM | POA: Diagnosis not present

## 2016-09-18 DIAGNOSIS — D631 Anemia in chronic kidney disease: Secondary | ICD-10-CM | POA: Diagnosis not present

## 2016-09-18 DIAGNOSIS — N183 Chronic kidney disease, stage 3 (moderate): Secondary | ICD-10-CM | POA: Diagnosis not present

## 2016-09-18 DIAGNOSIS — E785 Hyperlipidemia, unspecified: Secondary | ICD-10-CM | POA: Diagnosis not present

## 2016-09-18 DIAGNOSIS — I129 Hypertensive chronic kidney disease with stage 1 through stage 4 chronic kidney disease, or unspecified chronic kidney disease: Secondary | ICD-10-CM | POA: Diagnosis not present

## 2016-09-18 DIAGNOSIS — L89620 Pressure ulcer of left heel, unstageable: Secondary | ICD-10-CM | POA: Diagnosis not present

## 2016-09-18 DIAGNOSIS — G20 Parkinson's disease: Secondary | ICD-10-CM | POA: Diagnosis not present

## 2016-09-30 DIAGNOSIS — R600 Localized edema: Secondary | ICD-10-CM | POA: Diagnosis not present

## 2016-09-30 DIAGNOSIS — G20 Parkinson's disease: Secondary | ICD-10-CM | POA: Diagnosis not present

## 2016-09-30 DIAGNOSIS — Z48815 Encounter for surgical aftercare following surgery on the digestive system: Secondary | ICD-10-CM | POA: Diagnosis not present

## 2016-09-30 DIAGNOSIS — D631 Anemia in chronic kidney disease: Secondary | ICD-10-CM | POA: Diagnosis not present

## 2016-09-30 DIAGNOSIS — I129 Hypertensive chronic kidney disease with stage 1 through stage 4 chronic kidney disease, or unspecified chronic kidney disease: Secondary | ICD-10-CM | POA: Diagnosis not present

## 2016-09-30 DIAGNOSIS — N183 Chronic kidney disease, stage 3 (moderate): Secondary | ICD-10-CM | POA: Diagnosis not present

## 2016-09-30 DIAGNOSIS — L89620 Pressure ulcer of left heel, unstageable: Secondary | ICD-10-CM | POA: Diagnosis not present

## 2016-09-30 DIAGNOSIS — E785 Hyperlipidemia, unspecified: Secondary | ICD-10-CM | POA: Diagnosis not present

## 2016-10-01 DIAGNOSIS — G20 Parkinson's disease: Secondary | ICD-10-CM | POA: Diagnosis not present

## 2016-10-01 DIAGNOSIS — D631 Anemia in chronic kidney disease: Secondary | ICD-10-CM | POA: Diagnosis not present

## 2016-10-01 DIAGNOSIS — R600 Localized edema: Secondary | ICD-10-CM | POA: Diagnosis not present

## 2016-10-01 DIAGNOSIS — I129 Hypertensive chronic kidney disease with stage 1 through stage 4 chronic kidney disease, or unspecified chronic kidney disease: Secondary | ICD-10-CM | POA: Diagnosis not present

## 2016-10-01 DIAGNOSIS — E785 Hyperlipidemia, unspecified: Secondary | ICD-10-CM | POA: Diagnosis not present

## 2016-10-01 DIAGNOSIS — L89620 Pressure ulcer of left heel, unstageable: Secondary | ICD-10-CM | POA: Diagnosis not present

## 2016-10-01 DIAGNOSIS — Z48815 Encounter for surgical aftercare following surgery on the digestive system: Secondary | ICD-10-CM | POA: Diagnosis not present

## 2016-10-01 DIAGNOSIS — N183 Chronic kidney disease, stage 3 (moderate): Secondary | ICD-10-CM | POA: Diagnosis not present

## 2016-10-03 DIAGNOSIS — N183 Chronic kidney disease, stage 3 (moderate): Secondary | ICD-10-CM | POA: Diagnosis not present

## 2016-10-03 DIAGNOSIS — R600 Localized edema: Secondary | ICD-10-CM | POA: Diagnosis not present

## 2016-10-03 DIAGNOSIS — G20 Parkinson's disease: Secondary | ICD-10-CM | POA: Diagnosis not present

## 2016-10-03 DIAGNOSIS — I129 Hypertensive chronic kidney disease with stage 1 through stage 4 chronic kidney disease, or unspecified chronic kidney disease: Secondary | ICD-10-CM | POA: Diagnosis not present

## 2016-10-03 DIAGNOSIS — E785 Hyperlipidemia, unspecified: Secondary | ICD-10-CM | POA: Diagnosis not present

## 2016-10-03 DIAGNOSIS — L89620 Pressure ulcer of left heel, unstageable: Secondary | ICD-10-CM | POA: Diagnosis not present

## 2016-10-03 DIAGNOSIS — Z48815 Encounter for surgical aftercare following surgery on the digestive system: Secondary | ICD-10-CM | POA: Diagnosis not present

## 2016-10-03 DIAGNOSIS — D631 Anemia in chronic kidney disease: Secondary | ICD-10-CM | POA: Diagnosis not present

## 2016-10-07 DIAGNOSIS — Z48815 Encounter for surgical aftercare following surgery on the digestive system: Secondary | ICD-10-CM | POA: Diagnosis not present

## 2016-10-07 DIAGNOSIS — N183 Chronic kidney disease, stage 3 (moderate): Secondary | ICD-10-CM | POA: Diagnosis not present

## 2016-10-07 DIAGNOSIS — E785 Hyperlipidemia, unspecified: Secondary | ICD-10-CM | POA: Diagnosis not present

## 2016-10-07 DIAGNOSIS — I129 Hypertensive chronic kidney disease with stage 1 through stage 4 chronic kidney disease, or unspecified chronic kidney disease: Secondary | ICD-10-CM | POA: Diagnosis not present

## 2016-10-07 DIAGNOSIS — D631 Anemia in chronic kidney disease: Secondary | ICD-10-CM | POA: Diagnosis not present

## 2016-10-07 DIAGNOSIS — L89620 Pressure ulcer of left heel, unstageable: Secondary | ICD-10-CM | POA: Diagnosis not present

## 2016-10-07 DIAGNOSIS — G20 Parkinson's disease: Secondary | ICD-10-CM | POA: Diagnosis not present

## 2016-10-07 DIAGNOSIS — R600 Localized edema: Secondary | ICD-10-CM | POA: Diagnosis not present

## 2016-10-09 DIAGNOSIS — E785 Hyperlipidemia, unspecified: Secondary | ICD-10-CM | POA: Diagnosis not present

## 2016-10-09 DIAGNOSIS — I129 Hypertensive chronic kidney disease with stage 1 through stage 4 chronic kidney disease, or unspecified chronic kidney disease: Secondary | ICD-10-CM | POA: Diagnosis not present

## 2016-10-09 DIAGNOSIS — L89620 Pressure ulcer of left heel, unstageable: Secondary | ICD-10-CM | POA: Diagnosis not present

## 2016-10-09 DIAGNOSIS — N183 Chronic kidney disease, stage 3 (moderate): Secondary | ICD-10-CM | POA: Diagnosis not present

## 2016-10-09 DIAGNOSIS — R600 Localized edema: Secondary | ICD-10-CM | POA: Diagnosis not present

## 2016-10-09 DIAGNOSIS — G20 Parkinson's disease: Secondary | ICD-10-CM | POA: Diagnosis not present

## 2016-10-09 DIAGNOSIS — Z48815 Encounter for surgical aftercare following surgery on the digestive system: Secondary | ICD-10-CM | POA: Diagnosis not present

## 2016-10-09 DIAGNOSIS — D631 Anemia in chronic kidney disease: Secondary | ICD-10-CM | POA: Diagnosis not present

## 2016-10-14 DIAGNOSIS — G20 Parkinson's disease: Secondary | ICD-10-CM | POA: Diagnosis not present

## 2016-10-14 DIAGNOSIS — R600 Localized edema: Secondary | ICD-10-CM | POA: Diagnosis not present

## 2016-10-14 DIAGNOSIS — D631 Anemia in chronic kidney disease: Secondary | ICD-10-CM | POA: Diagnosis not present

## 2016-10-14 DIAGNOSIS — N183 Chronic kidney disease, stage 3 (moderate): Secondary | ICD-10-CM | POA: Diagnosis not present

## 2016-10-14 DIAGNOSIS — I129 Hypertensive chronic kidney disease with stage 1 through stage 4 chronic kidney disease, or unspecified chronic kidney disease: Secondary | ICD-10-CM | POA: Diagnosis not present

## 2016-10-14 DIAGNOSIS — L89620 Pressure ulcer of left heel, unstageable: Secondary | ICD-10-CM | POA: Diagnosis not present

## 2016-10-14 DIAGNOSIS — Z48815 Encounter for surgical aftercare following surgery on the digestive system: Secondary | ICD-10-CM | POA: Diagnosis not present

## 2016-10-14 DIAGNOSIS — E785 Hyperlipidemia, unspecified: Secondary | ICD-10-CM | POA: Diagnosis not present

## 2016-10-16 DIAGNOSIS — I129 Hypertensive chronic kidney disease with stage 1 through stage 4 chronic kidney disease, or unspecified chronic kidney disease: Secondary | ICD-10-CM | POA: Diagnosis not present

## 2016-10-16 DIAGNOSIS — Z48815 Encounter for surgical aftercare following surgery on the digestive system: Secondary | ICD-10-CM | POA: Diagnosis not present

## 2016-10-16 DIAGNOSIS — R600 Localized edema: Secondary | ICD-10-CM | POA: Diagnosis not present

## 2016-10-16 DIAGNOSIS — D631 Anemia in chronic kidney disease: Secondary | ICD-10-CM | POA: Diagnosis not present

## 2016-10-16 DIAGNOSIS — G20 Parkinson's disease: Secondary | ICD-10-CM | POA: Diagnosis not present

## 2016-10-16 DIAGNOSIS — L89620 Pressure ulcer of left heel, unstageable: Secondary | ICD-10-CM | POA: Diagnosis not present

## 2016-10-16 DIAGNOSIS — N183 Chronic kidney disease, stage 3 (moderate): Secondary | ICD-10-CM | POA: Diagnosis not present

## 2016-10-16 DIAGNOSIS — E785 Hyperlipidemia, unspecified: Secondary | ICD-10-CM | POA: Diagnosis not present

## 2016-10-22 DIAGNOSIS — D631 Anemia in chronic kidney disease: Secondary | ICD-10-CM | POA: Diagnosis not present

## 2016-10-22 DIAGNOSIS — R600 Localized edema: Secondary | ICD-10-CM | POA: Diagnosis not present

## 2016-10-22 DIAGNOSIS — Z48815 Encounter for surgical aftercare following surgery on the digestive system: Secondary | ICD-10-CM | POA: Diagnosis not present

## 2016-10-22 DIAGNOSIS — I129 Hypertensive chronic kidney disease with stage 1 through stage 4 chronic kidney disease, or unspecified chronic kidney disease: Secondary | ICD-10-CM | POA: Diagnosis not present

## 2016-10-22 DIAGNOSIS — G20 Parkinson's disease: Secondary | ICD-10-CM | POA: Diagnosis not present

## 2016-10-22 DIAGNOSIS — N183 Chronic kidney disease, stage 3 (moderate): Secondary | ICD-10-CM | POA: Diagnosis not present

## 2016-10-22 DIAGNOSIS — E785 Hyperlipidemia, unspecified: Secondary | ICD-10-CM | POA: Diagnosis not present

## 2016-10-22 DIAGNOSIS — L89620 Pressure ulcer of left heel, unstageable: Secondary | ICD-10-CM | POA: Diagnosis not present

## 2016-11-29 NOTE — Addendum Note (Signed)
Addendum  created 11/29/16 1003 by Sharee HolsterMassagee, Yameli Delamater, MD   Sign clinical note

## 2017-09-28 ENCOUNTER — Encounter (HOSPITAL_COMMUNITY): Payer: Self-pay | Admitting: Nurse Practitioner

## 2017-09-28 ENCOUNTER — Observation Stay (HOSPITAL_COMMUNITY)
Admission: EM | Admit: 2017-09-28 | Discharge: 2017-10-01 | Disposition: A | Discharge status: Skilled Nursing Facility | Payer: Medicare Other | Attending: Family Medicine | Admitting: Family Medicine

## 2017-09-28 ENCOUNTER — Emergency Department (HOSPITAL_COMMUNITY): Payer: Medicare Other

## 2017-09-28 DIAGNOSIS — I482 Chronic atrial fibrillation, unspecified: Secondary | ICD-10-CM | POA: Diagnosis present

## 2017-09-28 DIAGNOSIS — I5043 Acute on chronic combined systolic (congestive) and diastolic (congestive) heart failure: Secondary | ICD-10-CM | POA: Diagnosis not present

## 2017-09-28 DIAGNOSIS — I130 Hypertensive heart and chronic kidney disease with heart failure and stage 1 through stage 4 chronic kidney disease, or unspecified chronic kidney disease: Secondary | ICD-10-CM | POA: Diagnosis not present

## 2017-09-28 DIAGNOSIS — Z7982 Long term (current) use of aspirin: Secondary | ICD-10-CM | POA: Diagnosis not present

## 2017-09-28 DIAGNOSIS — G20 Parkinson's disease: Secondary | ICD-10-CM | POA: Insufficient documentation

## 2017-09-28 DIAGNOSIS — Z79899 Other long term (current) drug therapy: Secondary | ICD-10-CM | POA: Insufficient documentation

## 2017-09-28 DIAGNOSIS — I480 Paroxysmal atrial fibrillation: Secondary | ICD-10-CM | POA: Insufficient documentation

## 2017-09-28 DIAGNOSIS — I5023 Acute on chronic systolic (congestive) heart failure: Secondary | ICD-10-CM | POA: Diagnosis not present

## 2017-09-28 DIAGNOSIS — R002 Palpitations: Secondary | ICD-10-CM | POA: Insufficient documentation

## 2017-09-28 DIAGNOSIS — N183 Chronic kidney disease, stage 3 unspecified: Secondary | ICD-10-CM | POA: Diagnosis present

## 2017-09-28 DIAGNOSIS — J90 Pleural effusion, not elsewhere classified: Secondary | ICD-10-CM | POA: Diagnosis not present

## 2017-09-28 DIAGNOSIS — I509 Heart failure, unspecified: Secondary | ICD-10-CM | POA: Diagnosis not present

## 2017-09-28 DIAGNOSIS — R0602 Shortness of breath: Secondary | ICD-10-CM | POA: Diagnosis not present

## 2017-09-28 DIAGNOSIS — Z87891 Personal history of nicotine dependence: Secondary | ICD-10-CM | POA: Insufficient documentation

## 2017-09-28 DIAGNOSIS — I110 Hypertensive heart disease with heart failure: Secondary | ICD-10-CM | POA: Diagnosis not present

## 2017-09-28 DIAGNOSIS — G20A1 Parkinson's disease without dyskinesia, without mention of fluctuations: Secondary | ICD-10-CM | POA: Diagnosis present

## 2017-09-28 LAB — CBC
HEMATOCRIT: 32.3 % — AB (ref 39.0–52.0)
HEMOGLOBIN: 9.7 g/dL — AB (ref 13.0–17.0)
MCH: 27.9 pg (ref 26.0–34.0)
MCHC: 30 g/dL (ref 30.0–36.0)
MCV: 92.8 fL (ref 78.0–100.0)
Platelets: 141 10*3/uL — ABNORMAL LOW (ref 150–400)
RBC: 3.48 MIL/uL — ABNORMAL LOW (ref 4.22–5.81)
RDW: 15.2 % (ref 11.5–15.5)
WBC: 5.1 10*3/uL (ref 4.0–10.5)

## 2017-09-28 LAB — BASIC METABOLIC PANEL
Anion gap: 10 (ref 5–15)
BUN: 33 mg/dL — ABNORMAL HIGH (ref 6–20)
CHLORIDE: 108 mmol/L (ref 101–111)
CO2: 27 mmol/L (ref 22–32)
CREATININE: 1.56 mg/dL — AB (ref 0.61–1.24)
Calcium: 8.8 mg/dL — ABNORMAL LOW (ref 8.9–10.3)
GFR calc non Af Amer: 39 mL/min — ABNORMAL LOW (ref >60)
GFR, EST AFRICAN AMERICAN: 45 mL/min — AB (ref >60)
GLUCOSE: 114 mg/dL — AB (ref 65–99)
Potassium: 4.4 mmol/L (ref 3.5–5.1)
Sodium: 145 mmol/L (ref 135–145)

## 2017-09-28 LAB — I-STAT TROPONIN, ED: Troponin i, poc: 0.04 ng/mL (ref 0.00–0.08)

## 2017-09-28 LAB — BRAIN NATRIURETIC PEPTIDE: B NATRIURETIC PEPTIDE 5: 1677 pg/mL — AB (ref 0.0–100.0)

## 2017-09-28 MED ORDER — SODIUM CHLORIDE 0.9% FLUSH: 3.0000 mL | INTRAVENOUS | Status: DC | PRN

## 2017-09-28 MED ORDER — ACETAMINOPHEN 325 MG PO TABS: 650.0000 mg | ORAL_TABLET | ORAL | Status: DC | PRN

## 2017-09-28 MED ORDER — METOPROLOL TARTRATE 25 MG PO TABS
25.0000 mg | ORAL_TABLET | Freq: Two times a day (BID) | ORAL | Status: DC
  Administered 2017-09-29 – 2017-10-01 (×6): 25 mg via ORAL
  Filled 2017-09-28 (×6): qty 1

## 2017-09-28 MED ORDER — SODIUM CHLORIDE 0.9 % IV SOLN: 250.0000 mL | INTRAVENOUS | Status: DC | PRN

## 2017-09-28 MED ORDER — ONDANSETRON HCL 4 MG/2ML IJ SOLN: 4.0000 mg | Freq: Four times a day (QID) | INTRAMUSCULAR | Status: DC | PRN

## 2017-09-28 MED ORDER — CARBIDOPA-LEVODOPA 25-100 MG PO TABS
2.0000 | ORAL_TABLET | Freq: Three times a day (TID) | ORAL | Status: DC
  Administered 2017-09-29 – 2017-10-01 (×8): 2 via ORAL
  Filled 2017-09-28 (×9): qty 2

## 2017-09-28 MED ORDER — ASPIRIN EC 81 MG PO TBEC
81.0000 mg | DELAYED_RELEASE_TABLET | Freq: Every day | ORAL | Status: DC
  Administered 2017-09-29 – 2017-10-01 (×3): 81 mg via ORAL
  Filled 2017-09-28 (×3): qty 1

## 2017-09-28 MED ORDER — CALCIUM CARBONATE-VITAMIN D 500-200 MG-UNIT PO TABS
2.0000 | ORAL_TABLET | Freq: Every day | ORAL | Status: DC
  Administered 2017-09-29 – 2017-10-01 (×3): 2 via ORAL
  Filled 2017-09-28 (×3): qty 2

## 2017-09-28 MED ORDER — CLONIDINE HCL 0.1 MG/24HR TD PTWK
0.1000 mg | MEDICATED_PATCH | TRANSDERMAL | Status: DC
Start: 2017-10-03 — End: 2017-10-01

## 2017-09-28 MED ORDER — FOLIC ACID 1 MG PO TABS
1.0000 mg | ORAL_TABLET | Freq: Every day | ORAL | Status: DC
  Administered 2017-09-29 – 2017-10-01 (×3): 1 mg via ORAL
  Filled 2017-09-28 (×3): qty 1

## 2017-09-28 MED ORDER — SODIUM CHLORIDE 0.9% FLUSH
3.0000 mL | Freq: Two times a day (BID) | INTRAVENOUS | Status: DC
  Administered 2017-09-29 – 2017-10-01 (×6): 3 mL via INTRAVENOUS

## 2017-09-28 MED ORDER — FUROSEMIDE 10 MG/ML IJ SOLN
40.0000 mg | Freq: Every day | INTRAMUSCULAR | Status: DC
  Administered 2017-09-29 – 2017-10-01 (×4): 40 mg via INTRAVENOUS
  Filled 2017-09-28 (×4): qty 4

## 2017-09-28 MED ORDER — FUROSEMIDE 10 MG/ML IJ SOLN
40.0000 mg | Freq: Once | INTRAMUSCULAR | Status: AC
  Administered 2017-09-28: 40 mg via INTRAVENOUS
  Filled 2017-09-28: qty 4

## 2017-09-28 NOTE — H&P (Signed)
History and Physical    Kevin ChimesJames H Almquist UJW:[id number] DOB: 06/29/1930 DOA: 09/28/2017  PCP: Patient, No Pcp Per  Patient coming from: Home  Chief Complaint: Shortness of breath  HPI: Kevin Roman is a 84 y.o. male with medical history significant of A. fib, Parkinson's disease, hypertension, recently diagnosed congestive heart failure at the [facility] started on Lasix 2-3 weeks ago.  He reports his peripheral edema has gotten much improved since started on Lasix however he has not taken his Lasix for the last 2-3 days because someone told him that it was not good for his kidneys.  Patient started to get more short of breath so came to the ED.  He denies any cough or fevers.  He denies any chest pain.  His O2 sats are 100% on room air.  Patient is being referred for admission by the ED because he is not had an echocardiogram of his heart by the [facility].  Review of Systems: As per HPI otherwise 10 point review of systems negative.   Past Medical History:  Diagnosis Date  . Hypertension   . Parkinson's disease     Past Surgical History:  Procedure Laterality Date  . INGUINAL HERNIA REPAIR Left 07/26/2016   Procedure: HERNIA REPAIR INGUINAL ADULT;  Surgeon: Luretha MurphyMatthew Martin, MD;  Location: [facility];  Service: General;  Laterality: Left;  . PROSTATE SURGERY       reports that he has quit smoking. He has never used smokeless tobacco. He reports that he does not drink alcohol or use drugs.  No Known Allergies  History reviewed. No pertinent family history.  No premature coronary artery disease  Prior to Admission medications   Medication Sig Start Date End Date Taking? Authorizing Provider  Calcium Carbonate-Vitamin D3 (CALCIUM 600/VITAMIN D) 600-400 MG-UNIT TABS Take 2 tablets by mouth daily.   Yes [provider]  carbidopa-levodopa (SINEMET IR) 25-100 MG per tablet Take 2 tablets by mouth 3 (three) times daily.   Yes [provider]  cloNIDine (CATAPRES - DOSED IN MG/24 HR) 0.1  mg/24hr patch Place 1 patch (0.1 mg total) onto the skin once a week. 08/03/16  Yes Vassie LollMadera, Carlos, MD  folic acid (FOLVITE) 1 MG tablet Take 1 mg by mouth daily.   Yes [provider]  hydroxypropyl methylcellulose (ISOPTO TEARS) 2.5 % ophthalmic solution Place 1 drop into both eyes 3 (three) times daily as needed. For dry eyes   Yes [provider]  metoprolol (LOPRESSOR) 50 MG tablet Take 25 mg by mouth 2 (two) times daily.   Yes [provider]    Physical Exam: Vitals:   09/28/17 1922 09/28/17 2104 09/28/17 2258  BP: (!) 168/92  (!) 172/107  Pulse:   77  Resp: 20  (!) 21  Temp: 98.3 F (36.8 C)  97.9 F (36.6 C)  TempSrc: Oral  Oral  SpO2: 99% 98% 97%  Weight: 84.8 kg (187 lb)    Height: 5\' 8"  (1.727 m)        Constitutional: NAD, calm, comfortable with tremor Vitals:   09/28/17 1922 09/28/17 2104 09/28/17 2258  BP: (!) 168/92  (!) 172/107  Pulse:   77  Resp: 20  (!) 21  Temp: 98.3 F (36.8 C)  97.9 F (36.6 C)  TempSrc: Oral  Oral  SpO2: 99% 98% 97%  Weight: 84.8 kg (187 lb)    Height: 5\' 8"  (1.727 m)     Eyes: PERRL, lids and conjunctivae normal ENMT: Mucous membranes are moist.  Posterior pharynx clear of any exudate or lesions.Normal dentition.  Neck: normal, supple, no masses, no thyromegaly Respiratory: clear to auscultation bilaterally, no wheezing, no crackles. Normal respiratory effort. No accessory muscle use.  Cardiovascular: Regular rate and rhythm, no murmurs / rubs / gallops.  1+ extremity edema. 2+ pedal pulses. No carotid bruits.  Abdomen: no tenderness, no masses palpated. No hepatosplenomegaly. Bowel sounds positive.  Musculoskeletal: no clubbing / cyanosis. No joint deformity upper and lower extremities. Good ROM, no contractures. Normal muscle tone.  Skin: no rashes, lesions, ulcers. No induration Neurologic: CN 2-12 grossly intact. Sensation intact, DTR normal. Strength 5/5 in all 4.  Psychiatric: Normal judgment and  insight. Alert and oriented x 3. Normal mood.    Labs on Admission: I have personally reviewed following labs and imaging studies  CBC: Recent Labs  Lab 09/28/17 2030  WBC 5.1  HGB 9.7*  HCT 32.3*  MCV 92.8  PLT 141*   Basic Metabolic Panel: Recent Labs  Lab 09/28/17 2030  NA 145  K 4.4  CL 108  CO2 27  GLUCOSE 114*  BUN 33*  CREATININE 1.56*  CALCIUM 8.8*   GFR: Estimated Creatinine Clearance: 36.1 mL/min (A) (by C-G formula based on SCr of 1.56 mg/dL (H)). Liver Function Tests: No results for input(s): AST, ALT, ALKPHOS, BILITOT, PROT, ALBUMIN in the last 168 hours. No results for input(s): LIPASE, AMYLASE in the last 168 hours. No results for input(s): AMMONIA in the last 168 hours. Coagulation Profile: No results for input(s): INR, PROTIME in the last 168 hours. Cardiac Enzymes: No results for input(s): CKTOTAL, CKMB, CKMBINDEX, TROPONINI in the last 168 hours. BNP (last 3 results) No results for input(s): PROBNP in the last 8760 hours. HbA1C: No results for input(s): HGBA1C in the last 72 hours. CBG: No results for input(s): GLUCAP in the last 168 hours. Lipid Profile: No results for input(s): CHOL, HDL, LDLCALC, TRIG, CHOLHDL, LDLDIRECT in the last 72 hours. Thyroid Function Tests: No results for input(s): TSH, T4TOTAL, FREET4, T3FREE, THYROIDAB in the last 72 hours. Anemia Panel: No results for input(s): VITAMINB12, FOLATE, FERRITIN, TIBC, IRON, RETICCTPCT in the last 72 hours. Urine analysis:    Component Value Date/Time   COLORURINE YELLOW 07/21/2016 1405   APPEARANCEUR CLEAR 07/21/2016 1405   LABSPEC 1.042 (H) 07/21/2016 1405   PHURINE 5.0 07/21/2016 1405   GLUCOSEU NEGATIVE 07/21/2016 1405   HGBUR NEGATIVE 07/21/2016 1405   BILIRUBINUR NEGATIVE 07/21/2016 1405   KETONESUR NEGATIVE 07/21/2016 1405   PROTEINUR 30 (A) 07/21/2016 1405   NITRITE NEGATIVE 07/21/2016 1405   LEUKOCYTESUR NEGATIVE 07/21/2016 1405   Sepsis Labs:  !!!!!!!!!!!!!!!!!!!!!!!!!!!!!!!!!!!!!!!!!!!! @LABRCNTIP (procalcitonin:4,lacticidven:4) )No results found for this or any previous visit (from the past 240 hour(s)).   Radiological Exams on Admission: Dg Chest 2 View  Result Date: 09/28/2017 CLINICAL DATA:  84 year old male with shortness of breath. EXAM: CHEST - 2 VIEW COMPARISON:  Chest radiograph dated 01/03/2015 FINDINGS: There is mild cardiomegaly with mild vascular congestion. Small bilateral pleural effusions. There is no pneumothorax. The osseous structures are unremarkable. IMPRESSION: Cardiomegaly with mild vascular congestion and small bilateral pleural effusions. Clinical correlation is recommended. Electronically Signed   By: Elgie Collard M.D.   On: 09/28/2017 22:15    EKG: Independently reviewed.  A. fib with PVCs Old chart reviewed Case discussed with EDP Chest x-ray reviewed with edema and bilateral small pleural effusions  Assessment/Plan 84 year old male with acute congestive heart failure Principal Problem:   Acute CHF (congestive heart failure) (HCC)-patient on Lasix  40 mg IV every 24 hours.  Patient afebrile vital signs stable O2 sats are normal on room air.  Obtain cardiac echo in the morning.  Observation on medical.  Active Problems:   Parkinson's disease (HCC)-noted   CKD (chronic kidney disease), stage III (HCC)-creatinine at 1.5   Chronic a-fib (HCC)-rate controlled continue beta-blocker placed on aspirin. clarify home medication list   Med rec pending pharmacy review   DVT prophylaxis: SCDs Code Status: Full Family Communication: Son Disposition Plan: Per day team Consults called: None Admission status: Observation   Alice Burnside A MD [facility]  If 7PM-7AM, please contact night-coverage [email or web address] Password [facility]  09/28/2017, 11:37 PM

## 2017-09-28 NOTE — ED Provider Notes (Signed)
[facility]-EMERGENCY DEPT Provider Note   CSN: [id number] Arrival date & time: 09/28/17  1831     History   Chief Complaint No chief complaint on file.   HPI Kevin Roman is a 84 y.o. male.  HPI  Patient with history of hypertension, Parkinson's, recently diagnosed CHF presents with shortness of breath.  His son states he was started on Lasix 2-3 weeks ago and he has had less swelling in his legs and has been feeling improved.  However he did not take his Lasix for the past 2-3 days because he forgot to do so.  Now his shortness of breath has returned.  He has had no fever or cough.  His legs are swollen but he states they are improved compared to 2-3 weeks ago.  He denies chest pain.  There are no other associated systemic symptoms, there are no other alleviating or modifying factors.   Past Medical History:  Diagnosis Date  . Hypertension   . Parkinson's disease     Patient Active Problem List   Diagnosis Date Noted  . Chronic renal failure in pediatric patient, stage 3 (moderate) (HCC)   . SBO (small bowel obstruction) (HCC) 07/21/2016  . Incarcerated hernia 07/21/2016  . Acute renal failure (HCC) 11/28/2011  . Palpitations 11/26/2011  . HTN (hypertension) 11/26/2011  . Parkinson's disease (HCC) 11/26/2011  . Hypercholesteremia 11/26/2011  . Anemia 11/26/2011    Past Surgical History:  Procedure Laterality Date  . INGUINAL HERNIA REPAIR Left 07/26/2016   Procedure: HERNIA REPAIR INGUINAL ADULT;  Surgeon: Luretha Murphy, MD;  Location: [facility];  Service: General;  Laterality: Left;  . PROSTATE SURGERY          Home Medications    Prior to Admission medications   Medication Sig Start Date End Date Taking? Authorizing Provider  Calcium Carbonate-Vitamin D3 (CALCIUM 600/VITAMIN D) 600-400 MG-UNIT TABS Take 2 tablets by mouth daily.   Yes [provider]  carbidopa-levodopa (SINEMET IR) 25-100 MG per tablet Take 2 tablets by mouth 3  (three) times daily.   Yes [provider]  cloNIDine (CATAPRES - DOSED IN MG/24 HR) 0.1 mg/24hr patch Place 1 patch (0.1 mg total) onto the skin once a week. 08/03/16  Yes Vassie Loll, MD  folic acid (FOLVITE) 1 MG tablet Take 1 mg by mouth daily.   Yes [provider]  hydroxypropyl methylcellulose (ISOPTO TEARS) 2.5 % ophthalmic solution Place 1 drop into both eyes 3 (three) times daily as needed. For dry eyes   Yes [provider]  metoprolol (LOPRESSOR) 50 MG tablet Take 25 mg by mouth 2 (two) times daily.   Yes [provider]    Family History History reviewed. No pertinent family history.  Social History Social History   Tobacco Use  . Smoking status: Former Games developer  . Smokeless tobacco: Never Used  Substance Use Topics  . Alcohol use: No  . Drug use: No     Allergies   Patient has no known allergies.   Review of Systems Review of Systems  ROS reviewed and all otherwise negative except for mentioned in HPI   Physical Exam Updated Vital Signs BP (!) 172/107 (BP Location: Right Arm)   Pulse 77   Temp 97.9 F (36.6 C) (Oral)   Resp (!) 21   Ht 5\' 8"  (1.727 m)   Wt 84.8 kg (187 lb)   SpO2 97%   BMI 28.43 kg/m  Vitals reviewed Physical Exam  Physical Examination:  General appearance - alert, well appearing, and in no distress Mental status - alert, oriented to person, place, and time Eyes -no conjunctival injection, no scleral icterus Mouth - mucous membranes moist, pharynx normal without lesions Neck - supple, no significant adenopathy Chest - BSS, bilateral expiratory wheezing and mild crackles Heart - normal rate, regular rhythm, normal S1, S2, no murmurs, rubs, clicks or gallops Abdomen - soft, nontender, nondistended, no masses or organomegaly Neurological - alert, oriented x 3, normal speech, pill rolling tremor of upper extremities bilaterally Extremities - peripheral pulses normal, bilateral 2-3+ pedal edema with  chronic venous stasis changes Skin - normal coloration and turgor, no rashes   ED Treatments / Results  Labs (all labs ordered are listed, but only abnormal results are displayed) Labs Reviewed  CBC - Abnormal; Notable for the following components:      Result Value   RBC 3.48 (*)    Hemoglobin 9.7 (*)    HCT 32.3 (*)    Platelets 141 (*)    All other components within normal limits  BASIC METABOLIC PANEL - Abnormal; Notable for the following components:   Glucose, Bld 114 (*)    BUN 33 (*)    Creatinine, Ser 1.56 (*)    Calcium 8.8 (*)    GFR calc non Af Amer 39 (*)    GFR calc Af Amer 45 (*)    All other components within normal limits  BRAIN NATRIURETIC PEPTIDE - Abnormal; Notable for the following components:   B Natriuretic Peptide 1,677.0 (*)    All other components within normal limits  I-STAT TROPONIN, ED    EKG EKG Interpretation  Date/Time:  Sunday September 28 2017 19:27:44 EDT Ventricular Rate:  75 PR Interval:    QRS Duration: 165 QT Interval:  435 QTC Calculation: 486 R Axis:   52 Text Interpretation:  Atrial fibrillation Nonspecific intraventricular conduction delay Borderline T abnormalities, lateral leads artifact makes interpretation difficult- unclear rhythm, PVCs present- will need repeat Confirmed by Jerelyn Scott [phone]) on 09/28/2017 8:15:55 PM   Radiology Dg Chest 2 View  Result Date: 09/28/2017 CLINICAL DATA:  83 year old male with shortness of breath. EXAM: CHEST - 2 VIEW COMPARISON:  Chest radiograph dated 01/03/2015 FINDINGS: There is mild cardiomegaly with mild vascular congestion. Small bilateral pleural effusions. There is no pneumothorax. The osseous structures are unremarkable. IMPRESSION: Cardiomegaly with mild vascular congestion and small bilateral pleural effusions. Clinical correlation is recommended. Electronically Signed   By: Elgie Collard M.D.   On: 09/28/2017 22:15    Procedures Procedures (including critical care  time)  Medications Ordered in ED Medications  furosemide (LASIX) injection 40 mg (40 mg Intravenous Given 09/28/17 2231)     Initial Impression / Assessment and Plan / ED Course  I have reviewed the triage vital signs and the nursing notes.  Pertinent labs & imaging results that were available during my care of the patient were reviewed by me and considered in my medical decision making (see chart for details).    11:19 PM  D/w Dr. Onalee Hua, hospitalist for admission- pt will be observed overnight for diuresis.  D/w family and patient and they are glad for admission because this is a new diagnosis for them and they were not aware that he has heart failure.    EKG shows a lot of artifact due to patient's tremor- baseline rhythm is difficult to determine- will try to repeat to obtain less artifact. Troponin negative.  BNP elevated at 1600. Pt has had  approx 400cc urine output since lasix.  Continues to be short of breath.   Final Clinical Impressions(s) / ED Diagnoses   Final diagnoses:  Acute on chronic systolic congestive heart failure [facility](HCC)    ED Discharge Orders    None       Phillis HaggisMabe, Martha L, MD 09/28/17 2324

## 2017-09-28 NOTE — ED Triage Notes (Signed)
Pt presents in mild distress secondary to shortness of breath, family at bedside confide that pt has been out of his lasix for the last 2 days and has hx of severe CHF. Pt is currently wheezing, denies CP.

## 2017-09-29 ENCOUNTER — Other Ambulatory Visit: Payer: Self-pay

## 2017-09-29 ENCOUNTER — Observation Stay (HOSPITAL_BASED_OUTPATIENT_CLINIC_OR_DEPARTMENT_OTHER): Payer: Medicare Other

## 2017-09-29 DIAGNOSIS — I509 Heart failure, unspecified: Secondary | ICD-10-CM | POA: Diagnosis not present

## 2017-09-29 DIAGNOSIS — N183 Chronic kidney disease, stage 3 (moderate): Secondary | ICD-10-CM | POA: Diagnosis not present

## 2017-09-29 DIAGNOSIS — I5021 Acute systolic (congestive) heart failure: Secondary | ICD-10-CM

## 2017-09-29 DIAGNOSIS — I482 Chronic atrial fibrillation: Secondary | ICD-10-CM | POA: Diagnosis not present

## 2017-09-29 DIAGNOSIS — R9431 Abnormal electrocardiogram [ECG] [EKG]: Secondary | ICD-10-CM | POA: Diagnosis not present

## 2017-09-29 DIAGNOSIS — R5381 Other malaise: Secondary | ICD-10-CM | POA: Diagnosis not present

## 2017-09-29 DIAGNOSIS — I5023 Acute on chronic systolic (congestive) heart failure: Secondary | ICD-10-CM | POA: Diagnosis not present

## 2017-09-29 DIAGNOSIS — G20 Parkinson's disease: Secondary | ICD-10-CM | POA: Diagnosis not present

## 2017-09-29 LAB — BASIC METABOLIC PANEL
ANION GAP: 10 (ref 5–15)
BUN: 30 mg/dL — AB (ref 6–20)
CALCIUM: 8.9 mg/dL (ref 8.9–10.3)
CO2: 25 mmol/L (ref 22–32)
Chloride: 108 mmol/L (ref 101–111)
Creatinine, Ser: 1.49 mg/dL — ABNORMAL HIGH (ref 0.61–1.24)
GFR calc Af Amer: 47 mL/min — ABNORMAL LOW (ref >60)
GFR, EST NON AFRICAN AMERICAN: 41 mL/min — AB (ref >60)
GLUCOSE: 99 mg/dL (ref 65–99)
Potassium: 4 mmol/L (ref 3.5–5.1)
SODIUM: 143 mmol/L (ref 135–145)

## 2017-09-29 LAB — ECHOCARDIOGRAM COMPLETE
Height: 68 in
WEIGHTICAEL: 3375.68 [oz_av]

## 2017-09-29 NOTE — Progress Notes (Signed)
  Echocardiogram 2D Echocardiogram has been performed.  Kevin Roman L Androw 09/29/2017, 11:54 AM

## 2017-09-29 NOTE — Evaluation (Signed)
Physical Therapy Evaluation Patient Details Name: Kevin ChimesJames H Daponte MRN: [id number] DOB: October 04, 1929 Today's Date: 09/29/2017   History of Present Illness  84 y.o. male with medical history significant of A. fib, Parkinson's disease, hypertension, recently diagnosed congestive heart failure at the [facility] started on Lasix 2-3 weeks ago.  Pt presents with SOB and peripheral edema and diagnosed with acute CHF.  Clinical Impression  Pt admitted with above diagnosis. Pt currently with functional limitations due to the deficits listed below (see PT Problem List). Pt will benefit from skilled PT to increase their independence and safety with mobility to allow discharge to the venue listed below.  Pt requiring increased time however was able to go from bed to recliner today.  Pt reports he is ambulatory at home with RW and spouse supervises.  No family present at time of evaluation so if family feels unable to assist at current level, then pt may need SNF.  If family feels able to have pt d/c home then HHPT.     Follow Up Recommendations Supervision/Assistance - 24 hour;SNF    Equipment Recommendations  None recommended by PT    Recommendations for Other Services       Precautions / Restrictions Precautions Precautions: Fall      Mobility  Bed Mobility Overal bed mobility: Needs Assistance Bed Mobility: Supine to Sit     Supine to sit: HOB elevated;Min guard     General bed mobility comments: provided a hand for pt to self assist trunk upright, increased time and effort, pt took one rest break however states he takes rest breaks at home  Transfers Overall transfer level: Needs assistance Equipment used: Rolling walker (2 wheeled) Transfers: Sit to/from UGI CorporationStand;Stand Pivot Transfers Sit to Stand: Min guard Stand pivot transfers: Min guard       General transfer comment: very slow transfers however only min/guard for safety, verbal cues for safe technique, cues for posture and extension,  fatigues very quickly  Ambulation/Gait                Stairs            Wheelchair Mobility    Modified Rankin (Stroke Patients Only)       Balance Overall balance assessment: Needs assistance         Standing balance support: Bilateral upper extremity supported Standing balance-Leahy Scale: Poor Standing balance comment: requires UE support                             Pertinent Vitals/Pain Pain Assessment: No/denies pain    Home Living Family/patient expects to be discharged to:: Private residence Living Arrangements: Spouse/significant other Available Help at Discharge: Family Type of Home: House Home Access: Level entry     Home Layout: One level Home Equipment: Environmental consultantWalker - 2 wheels Additional Comments: has aide every day for about 2 hours per pt    Prior Function Level of Independence: Needs assistance   Gait / Transfers Assistance Needed: pt reports he is ambulatory with RW and spouse typically supervises  ADL's / Homemaking Assistance Needed: aide assists with bathing and dressing        Hand Dominance        Extremity/Trunk Assessment        Lower Extremity Assessment Lower Extremity Assessment: Generalized weakness(edematous LEs)       Communication   Communication: No difficulties  Cognition Arousal/Alertness: Awake/alert Behavior During Therapy: WFL for tasks assessed/performed Overall  Cognitive Status: Within Functional Limits for tasks assessed                                        General Comments      Exercises     Assessment/Plan    PT Assessment Patient needs continued PT services  PT Problem List Decreased strength;Decreased mobility;Decreased activity tolerance;Decreased knowledge of precautions;Decreased knowledge of use of DME;Decreased balance;Decreased coordination       PT Treatment Interventions DME instruction;Therapeutic activities;Gait training;Therapeutic exercise;Stair  training;Functional mobility training;Balance training;Patient/family education    PT Goals (Current goals can be found in the Care Plan section)  Acute Rehab PT Goals PT Goal Formulation: With patient Time For Goal Achievement: 10/13/17 Potential to Achieve Goals: Good    Frequency Min 3X/week   Barriers to discharge        Co-evaluation               AM-PAC PT "6 Clicks" Daily Activity  Outcome Measure Difficulty turning over in bed (including adjusting bedclothes, sheets and blankets)?: A Lot Difficulty moving from lying on back to sitting on the side of the bed? : A Lot Difficulty sitting down on and standing up from a chair with arms (e.g., wheelchair, bedside commode, etc,.)?: Unable Help needed moving to and from a bed to chair (including a wheelchair)?: A Little Help needed walking in hospital room?: A Lot Help needed climbing 3-5 steps with a railing? : A Lot 6 Click Score: 12    End of Session Equipment Utilized During Treatment: Gait belt Activity Tolerance: Patient tolerated treatment well Patient left: in chair;with call bell/phone within reach Nurse Communication: Mobility status PT Visit Diagnosis: Other abnormalities of gait and mobility (R26.89)    Time: 1610-9604 PT Time Calculation (min) (ACUTE ONLY): 22 min   Charges:   PT Evaluation $PT Eval Low Complexity: 1 Low     PT G CodesZenovia Jarred, PT, DPT 09/29/2017 Pager: [phone]   Maida Sale E 09/29/2017, 3:31 PM

## 2017-09-29 NOTE — ED Notes (Signed)
ED TO INPATIENT HANDOFF REPORT  Name/Age/Gender Kevin Roman 84 y.o. male  Code Status    Code Status Orders  (From admission, onward)        Start     Ordered   09/28/17 2339  Full code  Continuous     09/28/17 2339    Code Status History    Date Active Date Inactive Code Status Order ID Comments User Context   07/21/2016 1758 07/29/2016 1756 Full Code [id number]  Arrien, Jimmy Picket, MD Inpatient      Home/SNF/Other Home  Chief Complaint shortness of breath  Level of Care/Admitting Diagnosis ED Disposition    ED Disposition Condition [facility] Area: [facility] [[id number]]  Level of Care: Med-Surg [16]  Diagnosis: CHF (congestive heart failure) [facility]) [[id number]]  Admitting Physician: Phillips Grout [[id number]]  Attending Physician: Derrill Kay A [[id number]]  PT Class (Do Not Modify): Observation [104]  PT Acc Code (Do Not Modify): Observation [10022]       Medical History Past Medical History:  Diagnosis Date  . Hypertension   . Parkinson's disease     Allergies No Known Allergies  IV Location/Drains/Wounds Patient Lines/Drains/Airways Status   Active Line/Drains/Airways    Name:   Placement date:   Placement time:   Site:   Days:   Peripheral IV 07/24/16 Right Arm   07/24/16    0020    Arm   432   Peripheral IV 09/28/17 Left Arm   09/28/17    2103    Arm   1   Incision (Closed) 07/26/16 Abdomen Other (Comment)   07/26/16    0938     430          Labs/Imaging Results for orders placed or performed during the hospital encounter of 09/28/17 (from the past 48 hour(s))  CBC     Status: Abnormal   Collection Time: 09/28/17  8:30 PM  Result Value Ref Range   WBC 5.1 4.0 - 10.5 K/uL   RBC 3.48 (L) 4.22 - 5.81 MIL/uL   Hemoglobin 9.7 (L) 13.0 - 17.0 g/dL   HCT 32.3 (L) 39.0 - 52.0 %   MCV 92.8 78.0 - 100.0 fL   MCH 27.9 26.0 - 34.0 pg   MCHC 30.0 30.0 - 36.0 g/dL   RDW 15.2 11.5 - 15.5 %   Platelets 141 (L) 150 - 400  K/uL    Comment: Performed at [facility], [place] [address] [place], [place] [address]  Basic metabolic panel     Status: Abnormal   Collection Time: 09/28/17  8:30 PM  Result Value Ref Range   Sodium 145 135 - 145 mmol/L   Potassium 4.4 3.5 - 5.1 mmol/L   Chloride 108 101 - 111 mmol/L   CO2 27 22 - 32 mmol/L   Glucose, Bld 114 (H) 65 - 99 mg/dL   BUN 33 (H) 6 - 20 mg/dL   Creatinine, Ser 1.56 (H) 0.61 - 1.24 mg/dL   Calcium 8.8 (L) 8.9 - 10.3 mg/dL   GFR calc non Af Amer 39 (L) >60 mL/min   GFR calc Af Amer 45 (L) >60 mL/min    Comment: (NOTE) The eGFR has been calculated using the CKD EPI equation. This calculation has not been validated in all clinical situations. eGFR's persistently <60 mL/min signify possible Chronic Kidney Disease.    Anion gap 10 5 - 15    Comment: Performed at [facility], [place] [address] [place], [place] [address]  Brain natriuretic peptide     Status: Abnormal   Collection Time: 09/28/17  8:30 PM  Result Value Ref Range   B Natriuretic Peptide 1,677.0 (H) 0.0 - 100.0 pg/mL    Comment: Performed at [facility], [place] [address], [place], [place] [address]  I-stat troponin, ED     Status: None   Collection Time: 09/28/17  9:03 PM  Result Value Ref Range   Troponin i, poc 0.04 0.00 - 0.08 ng/mL   Comment 3            Comment: Due to the release kinetics of cTnI, a negative result within the first hours of the onset of symptoms does not rule out myocardial infarction with certainty. If myocardial infarction is still suspected, repeat the test at appropriate intervals.    Dg Chest 2 View  Result Date: 09/28/2017 CLINICAL DATA:  84 year old male with shortness of breath. EXAM: CHEST - 2 VIEW COMPARISON:  Chest radiograph dated 01/03/2015 FINDINGS: There is mild cardiomegaly with mild vascular congestion. Small bilateral pleural effusions. There is no pneumothorax. The osseous structures are  unremarkable. IMPRESSION: Cardiomegaly with mild vascular congestion and small bilateral pleural effusions. Clinical correlation is recommended. Electronically Signed   By: Anner Crete M.D.   On: 09/28/2017 22:15    Pending Labs Unresulted Labs (From admission, onward)   Start     Ordered   09/29/17 7915  Basic metabolic panel  Daily,   R     09/28/17 2339      Vitals/Pain Today's Vitals   09/28/17 1933 09/28/17 2104 09/28/17 2258 09/28/17 2330  BP:   (!) 172/107 (!) 170/98  Pulse:   77 75  Resp:   (!) 21 (!) 21  Temp:   97.9 F (36.6 C)   TempSrc:   Oral   SpO2:  98% 97% 96%  Weight:      Height:      PainSc: 0-No pain       Isolation Precautions No active isolations  Medications Medications  carbidopa-levodopa (SINEMET IR) 25-100 MG per tablet immediate release 2 tablet (has no administration in time range)  metoprolol tartrate (LOPRESSOR) tablet 25 mg (has no administration in time range)  calcium-vitamin D (OSCAL WITH D) 500-200 MG-UNIT per tablet 2 tablet (has no administration in time range)  folic acid (FOLVITE) tablet 1 mg (has no administration in time range)  cloNIDine (CATAPRES - Dosed in mg/24 hr) patch 0.1 mg (has no administration in time range)  sodium chloride flush (NS) 0.9 % injection 3 mL (has no administration in time range)  sodium chloride flush (NS) 0.9 % injection 3 mL (has no administration in time range)  0.9 %  sodium chloride infusion (has no administration in time range)  acetaminophen (TYLENOL) tablet 650 mg (has no administration in time range)  ondansetron (ZOFRAN) injection 4 mg (has no administration in time range)  furosemide (LASIX) injection 40 mg (has no administration in time range)  aspirin EC tablet 81 mg (has no administration in time range)  furosemide (LASIX) injection 40 mg (40 mg Intravenous Given 09/28/17 2231)    Mobility walks with person assist

## 2017-09-29 NOTE — Progress Notes (Signed)
PROGRESS NOTE  Kevin Roman [id number] DOB: 11-30-1929 DOA: 09/28/2017 PCP: Patient, No Pcp Per  HPI/Recap of past 24 hours: Kevin Roman is a 84 y.o. male with medical history significant of A. fib, Parkinson's disease, hypertension, [facility] 2-3 weeks ago.  He reports his peripheral edema has gotten much improved since started on Lasix however he has not taken his Lasix for the last 2-3 days because someone told him that it was not good for his kidneys.  Patient started to get more short of breath so came to the ED.  He denies any cough or fevers.  He denies any chest pain.  His O2 sats are 100% on room air.  Patient is being referred for admission by the ED because he is not [facility]  09/29/2017: Patient seen and examined at his bedside.  He denies any chest pain or dyspnea at rest.    Assessment/Plan: Principal Problem:   Acute CHF (congestive heart failure) (HCC) Active Problems:   Parkinson's disease (HCC)   CKD (chronic kidney disease), stage III (HCC)   Chronic a-fib (HCC)   CHF (congestive heart failure) (HCC)  Acute CHF exacerbation Continue IV Lasix 40 mg daily Continue strict I's and O Continue daily weight Continue home medications  Grade 2 diastolic dysfunction from chronic heart failure with reduced EF 45-50% 2D echo cardiogram done today 09/29/2017 revealed grade 2 diastolic dysfunction LVEF 45-50% Last 2D echo done in 2013 revealed LVEF 55% Continue IV Lasix Continue p.o. metoprolol Continue blood pressure control  Pulmonary edema Suspect cardiogenic due to acute CHF exacerbation Continue IV Lasix Continue to monitor urine output I personally reviewed chest x-ray which revealed increase in pulmonary vascularity bilaterally with small bilateral pleural effusions.  Hypertension Blood pressure stable Continue clonidine patch once weekly and Lopressor 25 mg twice  daily  Parkinson disease, stable Continue home medication  CKD stage III Baseline creatinine 1.5 Avoid nephrotoxic agents/hypotension Monitor urine output  Paroxysmal A. fib Rate controlled Continue beta-blocker   Code Status: Full code  Family Communication: None at bedside  Disposition Plan: Home when clinically stable   Consultants:  None  Procedures:  None  Antimicrobials:  None  DVT prophylaxis: SCDs   Objective: Vitals:   09/28/17 2258 09/28/17 2330 09/29/17 0051 09/29/17 0516  BP: (!) 172/107 (!) 170/98 (!) 168/98 (!) 144/69  Pulse: 77 75 79 70  Resp: (!) 21 (!) 21 20 20   Temp: 97.9 F (36.6 C)  98.2 F (36.8 C) 98.7 F (37.1 C)  TempSrc: Oral  Oral Oral  SpO2: 97% 96% 98% 98%  Weight:   95.7 kg (210 lb 15.7 oz)   Height:   5\' 8"  (1.727 m)     Intake/Output Summary (Last 24 hours) at 09/29/2017 1339 Last data filed at 09/29/2017 1114 Gross per 24 hour  Intake 360 ml  Output 3025 ml  Net -2665 ml   Filed Weights   09/28/17 1922 09/29/17 0051  Weight: 84.8 kg (187 lb) 95.7 kg (210 lb 15.7 oz)    Exam:   General: 84 year old male well-developed well-nourished no acute distress.  Alert and oriented x3.  Cardiovascular: Regular rate and rhythm with no rubs or gallops.  No JVD or thyromegaly.  Respiratory: Mild rales at bases.  No wheezes.  Good inspiratory effort.  Abdomen: Soft nontender nondistended with normal bowel sounds x4.  Musculoskeletal: 2+ pitting edema lower extremities  bilaterally  Skin: No ulcerative lesion noted  Psychiatry: Mood is appropriate for condition and setting.   Data Reviewed: CBC: Recent Labs  Lab 09/28/17 2030  WBC 5.1  HGB 9.7*  HCT 32.3*  MCV 92.8  PLT 141*   Basic Metabolic Panel: Recent Labs  Lab 09/28/17 2030 09/29/17 0508  NA 145 143  K 4.4 4.0  CL 108 108  CO2 27 25  GLUCOSE 114* 99  BUN 33* 30*  CREATININE 1.56* 1.49*  CALCIUM 8.8* 8.9   GFR: Estimated Creatinine Clearance:  39.9 mL/min (A) (by C-G formula based on SCr of 1.49 mg/dL (H)). Liver Function Tests: No results for input(s): AST, ALT, ALKPHOS, BILITOT, PROT, ALBUMIN in the last 168 hours. No results for input(s): LIPASE, AMYLASE in the last 168 hours. No results for input(s): AMMONIA in the last 168 hours. Coagulation Profile: No results for input(s): INR, PROTIME in the last 168 hours. Cardiac Enzymes: No results for input(s): CKTOTAL, CKMB, CKMBINDEX, TROPONINI in the last 168 hours. BNP (last 3 results) No results for input(s): PROBNP in the last 8760 hours. HbA1C: No results for input(s): HGBA1C in the last 72 hours. CBG: No results for input(s): GLUCAP in the last 168 hours. Lipid Profile: No results for input(s): CHOL, HDL, LDLCALC, TRIG, CHOLHDL, LDLDIRECT in the last 72 hours. Thyroid Function Tests: No results for input(s): TSH, T4TOTAL, FREET4, T3FREE, THYROIDAB in the last 72 hours. Anemia Panel: No results for input(s): VITAMINB12, FOLATE, FERRITIN, TIBC, IRON, RETICCTPCT in the last 72 hours. Urine analysis:    Component Value Date/Time   COLORURINE YELLOW 07/21/2016 1405   APPEARANCEUR CLEAR 07/21/2016 1405   LABSPEC 1.042 (H) 07/21/2016 1405   PHURINE 5.0 07/21/2016 1405   GLUCOSEU NEGATIVE 07/21/2016 1405   HGBUR NEGATIVE 07/21/2016 1405   BILIRUBINUR NEGATIVE 07/21/2016 1405   KETONESUR NEGATIVE 07/21/2016 1405   PROTEINUR 30 (A) 07/21/2016 1405   NITRITE NEGATIVE 07/21/2016 1405   LEUKOCYTESUR NEGATIVE 07/21/2016 1405   Sepsis Labs: @LABRCNTIP (procalcitonin:4,lacticidven:4)  )No results found for this or any previous visit (from the past 240 hour(s)).    Studies: Dg Chest 2 View  Result Date: 09/28/2017 CLINICAL DATA:  84 year old male with shortness of breath. EXAM: CHEST - 2 VIEW COMPARISON:  Chest radiograph dated 01/03/2015 FINDINGS: There is mild cardiomegaly with mild vascular congestion. Small bilateral pleural effusions. There is no pneumothorax. The  osseous structures are unremarkable. IMPRESSION: Cardiomegaly with mild vascular congestion and small bilateral pleural effusions. Clinical correlation is recommended. Electronically Signed   By: Elgie Collard M.D.   On: 09/28/2017 22:15    Scheduled Meds: . aspirin EC  81 mg Oral Daily  . calcium-vitamin D  2 tablet Oral Daily  . carbidopa-levodopa  2 tablet Oral TID WC  . [START ON 10/03/2017] cloNIDine  0.1 mg Transdermal Weekly  . folic acid  1 mg Oral Daily  . furosemide  40 mg Intravenous Daily  . metoprolol tartrate  25 mg Oral BID  . sodium chloride flush  3 mL Intravenous Q12H    Continuous Infusions: . sodium chloride       LOS: 0 days     Darlin Drop, MD [facility] Pager [phone]  If 7PM-7AM, please contact night-coverage [email or web address] Password [email or web address] 09/29/2017, 1:39 PM

## 2017-09-30 DIAGNOSIS — I482 Chronic atrial fibrillation: Secondary | ICD-10-CM | POA: Diagnosis not present

## 2017-09-30 DIAGNOSIS — R5381 Other malaise: Secondary | ICD-10-CM

## 2017-09-30 DIAGNOSIS — I509 Heart failure, unspecified: Secondary | ICD-10-CM | POA: Diagnosis not present

## 2017-09-30 DIAGNOSIS — I5023 Acute on chronic systolic (congestive) heart failure: Secondary | ICD-10-CM | POA: Diagnosis not present

## 2017-09-30 DIAGNOSIS — N183 Chronic kidney disease, stage 3 (moderate): Secondary | ICD-10-CM | POA: Diagnosis not present

## 2017-09-30 DIAGNOSIS — I5021 Acute systolic (congestive) heart failure: Secondary | ICD-10-CM | POA: Diagnosis not present

## 2017-09-30 LAB — BASIC METABOLIC PANEL
Anion gap: 10 (ref 5–15)
BUN: 28 mg/dL — AB (ref 6–20)
CALCIUM: 9.2 mg/dL (ref 8.9–10.3)
CHLORIDE: 104 mmol/L (ref 101–111)
CO2: 27 mmol/L (ref 22–32)
CREATININE: 1.51 mg/dL — AB (ref 0.61–1.24)
GFR calc Af Amer: 46 mL/min — ABNORMAL LOW (ref >60)
GFR calc non Af Amer: 40 mL/min — ABNORMAL LOW (ref >60)
Glucose, Bld: 124 mg/dL — ABNORMAL HIGH (ref 65–99)
Potassium: 4.1 mmol/L (ref 3.5–5.1)
Sodium: 141 mmol/L (ref 135–145)

## 2017-09-30 LAB — CBC
HCT: 33.9 % — ABNORMAL LOW (ref 39.0–52.0)
Hemoglobin: 10.4 g/dL — ABNORMAL LOW (ref 13.0–17.0)
MCH: 28 pg (ref 26.0–34.0)
MCHC: 30.7 g/dL (ref 30.0–36.0)
MCV: 91.4 fL (ref 78.0–100.0)
PLATELETS: 154 10*3/uL (ref 150–400)
RBC: 3.71 MIL/uL — ABNORMAL LOW (ref 4.22–5.81)
RDW: 15.1 % (ref 11.5–15.5)
WBC: 5.4 10*3/uL (ref 4.0–10.5)

## 2017-09-30 NOTE — Progress Notes (Signed)
PROGRESS NOTE  BRAIN HONEYCUTT ZOX:[id number] DOB: Nov 12, 1929 DOA: 09/28/2017 PCP: Patient, No Pcp Per  HPI/Recap of past 24 hours: Kevin Roman is a 84 y.o. male with medical history significant of A. fib, Parkinson's disease, hypertension, recently diagnosed congestive heart failure at the [place] started on Lasix 2-3 weeks ago.  He reports his peripheral edema has gotten much improved since started on Lasix however he has not taken his Lasix for the last 2-3 days because someone told him that it was not good for his kidneys.  Patient started to get more short of breath so came to the ED.  He denies any cough or fevers.  He denies any chest pain.  His O2 sats are 100% on room air.  Patient is being referred for admission by the ED because he is not had an echocardiogram of his heart by the [facility].  09/29/2017: Patient seen and examined at his bedside.  He denies any chest pain or dyspnea at rest.   09/30/17: no complaints. Good urine output. Too weak yesterday to participate in PT. Per PT, were not able to ambulate yesterday. Recommends SNF.    Assessment/Plan: Principal Problem:   Acute CHF (congestive heart failure) (HCC) Active Problems:   Parkinson's disease (HCC)   CKD (chronic kidney disease), stage III (HCC)   Chronic a-fib (HCC)   CHF (congestive heart failure) (HCC)  Acute CHF exacerbation Continue IV Lasix 40 mg daily Continue strict I's and O Continue daily weight Continue home medications  Generalized weakness/physical debility Continue PT Social worker consult for placement Needs short term rehab  Grade 2 diastolic dysfunction from chronic heart failure with reduced EF 45-50% 2D echo cardiogram done today 09/29/2017 revealed grade 2 diastolic dysfunction LVEF 45-50% Last 2D echo done in 2013 revealed LVEF 55% Continue IV Lasix Continue p.o. metoprolol Continue blood pressure control  Pulmonary edema Suspect cardiogenic due to acute CHF exacerbation Continue IV  Lasix Continue to monitor urine output I personally reviewed chest x-ray which revealed increase in pulmonary vascularity bilaterally with small bilateral pleural effusions.  Hypertension Blood pressure stable Continue clonidine patch once weekly and Lopressor 25 mg twice daily  Parkinson disease, stable Continue home medication  CKD stage III Baseline creatinine 1.5 Avoid nephrotoxic agents/hypotension Monitor urine output  Paroxysmal A. fib Rate controlled Continue beta-blocker   Code Status: Full code  Family Communication: None at bedside  Disposition Plan: Home when clinically stable   Consultants:  None  Procedures:  None  Antimicrobials:  None  DVT prophylaxis: SCDs   Objective: Vitals:   09/29/17 2044 09/30/17 0540 09/30/17 0940 09/30/17 1509  BP: (!) 151/75 (!) 156/84 (!) 126/54 138/81  Pulse: 68 79 64 66  Resp: 20 18 14 15   Temp: 98.7 F (37.1 C) 98.7 F (37.1 C) 98.9 F (37.2 C) 99.5 F (37.5 C)  TempSrc: Oral Oral Oral Oral  SpO2: 97% 96% 97% 98%  Weight:  89.9 kg (198 lb 3.1 oz)    Height:        Intake/Output Summary (Last 24 hours) at 09/30/2017 1907 Last data filed at 09/30/2017 1510 Gross per 24 hour  Intake 360 ml  Output 1800 ml  Net -1440 ml   Filed Weights   09/28/17 1922 09/29/17 0051 09/30/17 0540  Weight: 84.8 kg (187 lb) 95.7 kg (210 lb 15.7 oz) 89.9 kg (198 lb 3.1 oz)    Exam: 09/30/17   General: 84 yo AAM WD WN NAD A&O x3  Cardiovascular: RRR no  rubs or gallops. No JVD or thyromegaly.  Respiratory: Mild rales at bases.  No wheezes.  Good inspiratory effort.  Abdomen: Soft nontender nondistended with normal bowel sounds x4.  Musculoskeletal: 2+ pitting edema lower extremities bilaterally  Skin: No ulcerative lesion noted  Psychiatry: Mood is appropriate for condition and setting.   Data Reviewed: CBC: Recent Labs  Lab 09/28/17 2030 09/30/17 0530  WBC 5.1 5.4  HGB 9.7* 10.4*  HCT 32.3* 33.9*  MCV  92.8 91.4  PLT 141* 154   Basic Metabolic Panel: Recent Labs  Lab 09/28/17 2030 09/29/17 0508 09/30/17 0530  NA 145 143 141  K 4.4 4.0 4.1  CL 108 108 104  CO2 27 25 27   GLUCOSE 114* 99 124*  BUN 33* 30* 28*  CREATININE 1.56* 1.49* 1.51*  CALCIUM 8.8* 8.9 9.2   GFR: Estimated Creatinine Clearance: 38.2 mL/min (A) (by C-G formula based on SCr of 1.51 mg/dL (H)). Liver Function Tests: No results for input(s): AST, ALT, ALKPHOS, BILITOT, PROT, ALBUMIN in the last 168 hours. No results for input(s): LIPASE, AMYLASE in the last 168 hours. No results for input(s): AMMONIA in the last 168 hours. Coagulation Profile: No results for input(s): INR, PROTIME in the last 168 hours. Cardiac Enzymes: No results for input(s): CKTOTAL, CKMB, CKMBINDEX, TROPONINI in the last 168 hours. BNP (last 3 results) No results for input(s): PROBNP in the last 8760 hours. HbA1C: No results for input(s): HGBA1C in the last 72 hours. CBG: No results for input(s): GLUCAP in the last 168 hours. Lipid Profile: No results for input(s): CHOL, HDL, LDLCALC, TRIG, CHOLHDL, LDLDIRECT in the last 72 hours. Thyroid Function Tests: No results for input(s): TSH, T4TOTAL, FREET4, T3FREE, THYROIDAB in the last 72 hours. Anemia Panel: No results for input(s): VITAMINB12, FOLATE, FERRITIN, TIBC, IRON, RETICCTPCT in the last 72 hours. Urine analysis:    Component Value Date/Time   COLORURINE YELLOW 07/21/2016 1405   APPEARANCEUR CLEAR 07/21/2016 1405   LABSPEC 1.042 (H) 07/21/2016 1405   PHURINE 5.0 07/21/2016 1405   GLUCOSEU NEGATIVE 07/21/2016 1405   HGBUR NEGATIVE 07/21/2016 1405   BILIRUBINUR NEGATIVE 07/21/2016 1405   KETONESUR NEGATIVE 07/21/2016 1405   PROTEINUR 30 (A) 07/21/2016 1405   NITRITE NEGATIVE 07/21/2016 1405   LEUKOCYTESUR NEGATIVE 07/21/2016 1405   Sepsis Labs: @LABRCNTIP (procalcitonin:4,lacticidven:4)  )No results found for this or any previous visit (from the past 240 hour(s)).     Studies: No results found.  Scheduled Meds: . aspirin EC  81 mg Oral Daily  . calcium-vitamin D  2 tablet Oral Daily  . carbidopa-levodopa  2 tablet Oral TID WC  . [START ON 10/03/2017] cloNIDine  0.1 mg Transdermal Weekly  . folic acid  1 mg Oral Daily  . furosemide  40 mg Intravenous Daily  . metoprolol tartrate  25 mg Oral BID  . sodium chloride flush  3 mL Intravenous Q12H    Continuous Infusions: . sodium chloride       LOS: 0 days     Darlin Droparole N Briahna Pescador, MD [facility] Pager [phone]  If 7PM-7AM, please contact night-coverage [email or web address] Password [email or web address] 09/30/2017, 7:07 PM

## 2017-09-30 NOTE — Care Management Obs Status (Signed)
MEDICARE OBSERVATION STATUS NOTIFICATION   Patient Details  Name: Kevin Roman MRN: [id number] Date of Birth: April 14, 1930   Medicare Observation Status Notification Given:  Yes    MahabirOlegario Messier, Sullivan Blasing, RN 09/30/2017, 3:41 PM

## 2017-09-30 NOTE — Progress Notes (Signed)
Tele alerted this nurse that patient had 3 beat run of Vtach. BP stable. Pt asymptomatic. Donnamarie Poag NP made aware. Will continue to monitor pt.

## 2017-09-30 NOTE — Clinical Social Work Note (Signed)
Clinical Social Work Assessment  Patient Details  Name: Kevin Roman MRN: [id number] Date of Birth: Sep 10, 1929  Date of referral:  09/30/17               Reason for consult:  Facility Placement                Permission sought to share information with:  Oceanographeracility Contact Representative Permission granted to share information::  Yes, Verbal Permission Granted  Name::        Agency::     Relationship::     Contact Information:     Housing/Transportation Living arrangements for the past 2 months:  Single Family Home Source of Information:  Patient, Adult Children Patient Interpreter Needed:  None Criminal Activity/Legal Involvement Pertinent to Current Situation/Hospitalization:  No - Comment as needed Significant Relationships:  Adult Children, Spouse Lives with:  Adult Children, Spouse Do you feel safe going back to the place where you live?  (PT recommending SNF) Need for family participation in patient care:  Yes (Comment)  Care giving concerns:  Patient from home with his son and wife. Patient's daughter reported that patient has an aide that comes Monday-Friday from 9-12pm to assist with breakfast, bathing and dressing. Patient's daughter reported that she and patient's son assist the patient on the weekend with dressing and bathing. PT recommending SNF.    Social Worker assessment / plan:  CSW spoke with patient/patient's daughter at bedside regarding PT recommendation for SNF. Patient reported [facility]. Patient's daughter reported [facility]. Patient reported that he is agreeable to SNF for ST rehab. CSW explained SNF placement process and insurance authorization, patient's daughter verbalized understanding.   CSW will complete FL2 and [facility].  CSW will continue to follow and assist with discharge planning.  Employment status:  Retired Product/process development scientistnsurance information:  Managed  Medicare PT Recommendations:  Skilled Holiday representativeursing Facility, 24 Hour Supervision Information / Referral to community resources:  Skilled Nursing Facility  Patient/Family's Response to care:  Patient/patient's daughter appreciative of CSW assistance with discharge planning.   Patient/Family's Understanding of and Emotional Response to Diagnosis, Current Treatment, and Prognosis:  Patient presented calm and verbalized plan to dc to SNF for ST rehab. Patient's daughter involved in patient's care and hopeful that patient will regain strength in SNF and return home.   Emotional Assessment Appearance:  Appears stated age Attitude/Demeanor/Rapport:  Other(Cooperative) Affect (typically observed):  Calm Orientation:  Oriented to Self, Oriented to Situation, Oriented to Place, Oriented to  Time Alcohol / Substance use:  Not Applicable Psych involvement (Current and /or in the community):  No (Comment)  Discharge Needs  Concerns to be addressed:  Care Coordination Readmission within the last 30 days:  No Current discharge risk:  Physical Impairment Barriers to Discharge:  Continued Medical Work up   USG CorporationKimberly L Earlie Schank, LCSW 09/30/2017, 4:01 PM

## 2017-09-30 NOTE — [place] FL2 (Signed)
[place] MEDICAID FL2 LEVEL OF CARE SCREENING TOOL     IDENTIFICATION  Patient Name: Kevin Roman Birthdate: Jul 26, 1929 Sex: male Admission Date (Current Location): 09/28/2017  [name]   Facility and Address:  [facility],  [address], [place] [id number]      Provider Number: [id number]  Attending Physician Name and Address:  Darlin DropHall, Carole N, DO  Relative Name and Phone Number:       Current Level of Care: Hospital Recommended Level of Care: Skilled Nursing Facility Prior Approval Number:    Date Approved/Denied:   PASRR Number: [id number]  Discharge Plan: SNF    Current Diagnoses: Patient Active Problem List   Diagnosis Date Noted  . CKD (chronic kidney disease), stage III (HCC) 09/28/2017  . Chronic a-fib (HCC) 09/28/2017  . CHF (congestive heart failure) (HCC) 09/28/2017  . Acute CHF (congestive heart failure) (HCC)   . SBO (small bowel obstruction) (HCC) 07/21/2016  . Incarcerated hernia 07/21/2016  . Acute renal failure (HCC) 11/28/2011  . Palpitations 11/26/2011  . HTN (hypertension) 11/26/2011  . Parkinson's disease (HCC) 11/26/2011  . Hypercholesteremia 11/26/2011  . Anemia 11/26/2011    Orientation RESPIRATION BLADDER Height & Weight     Self, Time, Situation, Place  Normal Incontinent Weight: 198 lb 3.1 oz (89.9 kg) Height:  5\' 8"  (172.7 cm)  BEHAVIORAL SYMPTOMS/MOOD NEUROLOGICAL BOWEL NUTRITION STATUS    (tremors)   Diet(see dc summary)  AMBULATORY STATUS COMMUNICATION OF NEEDS Skin   Extensive Assist Verbally Normal                       Personal Care Assistance Level of Assistance  Bathing, Feeding, Dressing Bathing Assistance: Maximum assistance Feeding assistance: Limited assistance Dressing Assistance: Maximum assistance     Functional Limitations Info  Sight, Hearing, Speech Sight Info: Adequate Hearing Info: Adequate Speech Info: Adequate    SPECIAL CARE FACTORS FREQUENCY   PT (By licensed PT), OT (By licensed OT)     PT Frequency: 5x/week OT Frequency: 5x/week            Contractures Contractures Info: Not present    Additional Factors Info  Code Status, Allergies Code Status Info: Full code Allergies Info: NKA           Current Medications (09/30/2017):  This is the current hospital active medication list Current Facility-Administered Medications  Medication Dose Route Frequency Provider Last Rate Last Dose  . 0.9 %  sodium chloride infusion  250 mL Intravenous PRN Haydee Monicaavid, Rachal A, MD      . acetaminophen (TYLENOL) tablet 650 mg  650 mg Oral Q4H PRN Haydee Monicaavid, Rachal A, MD      . aspirin EC tablet 81 mg  81 mg Oral Daily Tarry Kosavid, Rachal A, MD   81 mg at 09/30/17 1015  . calcium-vitamin D (OSCAL WITH D) 500-200 MG-UNIT per tablet 2 tablet  2 tablet Oral Daily Haydee Monicaavid, Rachal A, MD   2 tablet at 09/30/17 1014  . carbidopa-levodopa (SINEMET IR) 25-100 MG per tablet immediate release 2 tablet  2 tablet Oral TID WC Haydee Monicaavid, Rachal A, MD   2 tablet at 09/30/17 1135  . [START ON 10/03/2017] cloNIDine (CATAPRES - Dosed in mg/24 hr) patch 0.1 mg  0.1 mg Transdermal Weekly Tarry Kosavid, Rachal A, MD      . folic acid (FOLVITE) tablet 1 mg  1 mg Oral Daily Tarry Kosavid, Rachal A, MD   1 mg at 09/30/17 1016  .  furosemide (LASIX) injection 40 mg  40 mg Intravenous Daily Tarry Kos A, MD   40 mg at 09/30/17 1015  . metoprolol tartrate (LOPRESSOR) tablet 25 mg  25 mg Oral BID Haydee Monica, MD   25 mg at 09/30/17 1015  . ondansetron (ZOFRAN) injection 4 mg  4 mg Intravenous Q6H PRN Tarry Kos A, MD      . sodium chloride flush (NS) 0.9 % injection 3 mL  3 mL Intravenous Q12H Tarry Kos A, MD   3 mL at 09/30/17 1031  . sodium chloride flush (NS) 0.9 % injection 3 mL  3 mL Intravenous PRN Haydee Monica, MD         Discharge Medications: Please see discharge summary for a list of discharge medications.  Relevant Imaging Results:  Relevant Lab Results:   Additional  Information ssn#[phone]  Antionette Poles, LCSW

## 2017-09-30 NOTE — Progress Notes (Signed)
Physical Therapy Treatment Patient Details Name: Kevin Roman MRN: [id number] DOB: [birth year] Today's Date: 09/30/2017    History of Present Illness 84 y.o. male with medical history significant of A. fib, Parkinson's disease, hypertension, recently diagnosed congestive heart failure at the [place] started on Lasix 2-3 weeks ago.  Pt presents with SOB and peripheral edema and diagnosed with acute CHF.    PT Comments    Progressing with mobility. Pt required Min guard assist for ambulation on today. He was able to walk ~60 feet with a RW. Still feel pt could benefit from a short rehab stay if possible, and if pt is agreeable. Will continue to follow.     Follow Up Recommendations  SNF     Equipment Recommendations  None recommended by PT    Recommendations for Other Services       Precautions / Restrictions Precautions Precautions: Fall Restrictions Weight Bearing Restrictions: No    Mobility  Bed Mobility               General bed mobility comments: oob in recliner  Transfers Overall transfer level: Needs assistance Equipment used: Rolling walker (2 wheeled) Transfers: Sit to/from Stand Sit to Stand: Min guard         General transfer comment: very slow transfers however only min/guard for safety, verbal cues for safe technique, cues for posture and extension  Ambulation/Gait Ambulation/Gait assistance: Min guard Ambulation Distance (Feet): 60 Feet Assistive device: Rolling walker (2 wheeled) Gait Pattern/deviations: Festinating;Decreased stride length;Trunk flexed     General Gait Details: Cues for posture, step length, proper position within RW. close guard for safety. slow gait speed.    Stairs            Wheelchair Mobility    Modified Rankin (Stroke Patients Only)       Balance                                            Cognition Arousal/Alertness: Awake/alert Behavior During Therapy: WFL for tasks  assessed/performed Overall Cognitive Status: Within Functional Limits for tasks assessed                                        Exercises General Exercises - Lower Extremity Ankle Circles/Pumps: AAROM;Both;5 reps;Seated Quad Sets: AROM;Both;10 reps;Seated Long Arc Quad: AROM;Both;10 reps;Seated Hip Flexion/Marching: AROM;Both;10 reps;Seated    General Comments        Pertinent Vitals/Pain Pain Assessment: No/denies pain    Home Living                      Prior Function            PT Goals (current goals can now be found in the care plan section) Progress towards PT goals: Progressing toward goals    Frequency    Min 3X/week      PT Plan Current plan remains appropriate    Co-evaluation              AM-PAC PT "6 Clicks" Daily Activity  Outcome Measure  Difficulty turning over in bed (including adjusting bedclothes, sheets and blankets)?: A Lot Difficulty moving from lying on back to sitting on the side of the bed? : A Lot Difficulty sitting down on and standing  up from a chair with arms (e.g., wheelchair, bedside commode, etc,.)?: A Lot Help needed moving to and from a bed to chair (including a wheelchair)?: A Little Help needed walking in hospital room?: A Little Help needed climbing 3-5 steps with a railing? : A Lot 6 Click Score: 14    End of Session Equipment Utilized During Treatment: Gait belt Activity Tolerance: Patient tolerated treatment well Patient left: in chair;with call bell/phone within reach;with chair alarm set;with family/visitor present   PT Visit Diagnosis: Muscle weakness (generalized) (M62.81);Other abnormalities of gait and mobility (R26.89);Difficulty in walking, not elsewhere classified (R26.2)     Time: 1535-1551 PT Time [id number] (min) (ACUTE ONLY): 16 min  Charges:  $Gait Training: 8-22 mins                    G Codes:          Rebeca AlertJannie Ziyad Dyar, MPT Pager: [phone]

## 2017-10-01 DIAGNOSIS — R2689 Other abnormalities of gait and mobility: Secondary | ICD-10-CM | POA: Diagnosis not present

## 2017-10-01 DIAGNOSIS — R002 Palpitations: Secondary | ICD-10-CM | POA: Diagnosis not present

## 2017-10-01 DIAGNOSIS — Z87891 Personal history of nicotine dependence: Secondary | ICD-10-CM | POA: Diagnosis not present

## 2017-10-01 DIAGNOSIS — N183 Chronic kidney disease, stage 3 (moderate): Secondary | ICD-10-CM | POA: Diagnosis not present

## 2017-10-01 DIAGNOSIS — R110 Nausea: Secondary | ICD-10-CM | POA: Diagnosis not present

## 2017-10-01 DIAGNOSIS — I509 Heart failure, unspecified: Secondary | ICD-10-CM

## 2017-10-01 DIAGNOSIS — R1110 Vomiting, unspecified: Secondary | ICD-10-CM | POA: Diagnosis not present

## 2017-10-01 DIAGNOSIS — J810 Acute pulmonary edema: Secondary | ICD-10-CM | POA: Diagnosis not present

## 2017-10-01 DIAGNOSIS — N4400 Torsion of testis, unspecified: Secondary | ICD-10-CM | POA: Diagnosis not present

## 2017-10-01 DIAGNOSIS — K460 Unspecified abdominal hernia with obstruction, without gangrene: Secondary | ICD-10-CM | POA: Diagnosis not present

## 2017-10-01 DIAGNOSIS — I480 Paroxysmal atrial fibrillation: Secondary | ICD-10-CM | POA: Diagnosis not present

## 2017-10-01 DIAGNOSIS — D72829 Elevated white blood cell count, unspecified: Secondary | ICD-10-CM | POA: Diagnosis not present

## 2017-10-01 DIAGNOSIS — Z79899 Other long term (current) drug therapy: Secondary | ICD-10-CM | POA: Diagnosis not present

## 2017-10-01 DIAGNOSIS — I482 Chronic atrial fibrillation: Secondary | ICD-10-CM

## 2017-10-01 DIAGNOSIS — M6281 Muscle weakness (generalized): Secondary | ICD-10-CM | POA: Diagnosis not present

## 2017-10-01 DIAGNOSIS — I5043 Acute on chronic combined systolic (congestive) and diastolic (congestive) heart failure: Secondary | ICD-10-CM | POA: Diagnosis not present

## 2017-10-01 DIAGNOSIS — I130 Hypertensive heart and chronic kidney disease with heart failure and stage 1 through stage 4 chronic kidney disease, or unspecified chronic kidney disease: Secondary | ICD-10-CM | POA: Diagnosis not present

## 2017-10-01 DIAGNOSIS — G20 Parkinson's disease: Secondary | ICD-10-CM | POA: Diagnosis not present

## 2017-10-01 DIAGNOSIS — E785 Hyperlipidemia, unspecified: Secondary | ICD-10-CM | POA: Diagnosis not present

## 2017-10-01 DIAGNOSIS — J90 Pleural effusion, not elsewhere classified: Secondary | ICD-10-CM | POA: Diagnosis not present

## 2017-10-01 DIAGNOSIS — D649 Anemia, unspecified: Secondary | ICD-10-CM | POA: Diagnosis not present

## 2017-10-01 DIAGNOSIS — K56609 Unspecified intestinal obstruction, unspecified as to partial versus complete obstruction: Secondary | ICD-10-CM | POA: Diagnosis not present

## 2017-10-01 DIAGNOSIS — I10 Essential (primary) hypertension: Secondary | ICD-10-CM | POA: Diagnosis not present

## 2017-10-01 DIAGNOSIS — Z7982 Long term (current) use of aspirin: Secondary | ICD-10-CM | POA: Diagnosis not present

## 2017-10-01 DIAGNOSIS — I5033 Acute on chronic diastolic (congestive) heart failure: Secondary | ICD-10-CM | POA: Diagnosis not present

## 2017-10-01 DIAGNOSIS — I5040 Unspecified combined systolic (congestive) and diastolic (congestive) heart failure: Secondary | ICD-10-CM | POA: Diagnosis not present

## 2017-10-01 DIAGNOSIS — K219 Gastro-esophageal reflux disease without esophagitis: Secondary | ICD-10-CM | POA: Diagnosis not present

## 2017-10-01 LAB — BASIC METABOLIC PANEL WITH GFR
Anion gap: 10 (ref 5–15)
BUN: 24 mg/dL — ABNORMAL HIGH (ref 6–20)
CO2: 30 mmol/L (ref 22–32)
Calcium: 9.3 mg/dL (ref 8.9–10.3)
Chloride: 102 mmol/L (ref 101–111)
Creatinine, Ser: 1.42 mg/dL — ABNORMAL HIGH (ref 0.61–1.24)
GFR calc Af Amer: 50 mL/min — ABNORMAL LOW
GFR calc non Af Amer: 43 mL/min — ABNORMAL LOW
Glucose, Bld: 103 mg/dL — ABNORMAL HIGH (ref 65–99)
Potassium: 4.1 mmol/L (ref 3.5–5.1)
Sodium: 142 mmol/L (ref 135–145)

## 2017-10-01 MED ORDER — FUROSEMIDE 40 MG PO TABS: 60.0000 mg | ORAL_TABLET | Freq: Every day | ORAL | 0 refills | Status: AC

## 2017-10-01 MED ORDER — ASPIRIN 81 MG PO TBEC: 81.0000 mg | DELAYED_RELEASE_TABLET | Freq: Every day | ORAL | Status: AC

## 2017-10-01 NOTE — Discharge Summary (Signed)
Physician Discharge Summary  KHALIFA KNECHT  WUJ:[id number]  DOB: 11-01-1929  DOA: 09/28/2017 PCP: Patient, No Pcp Per  Admit date: 09/28/2017 Discharge date: 10/01/2017  Admitted From: Home Disposition: SNF  Recommendations for Outpatient Follow-up:  1. Follow up with SNF provider at earliest convenience 2. Please obtain BMP/CBC in one week to monitor renal function and hemoglobin 3. Cardiology referral  Discharge Condition: Stable CODE STATUS: Full code Diet recommendation: Heart Healthy  Brief/Interim Summary: For full details see H&P/Progress note, but in brief, Kevin Roman is a 84 year old male with medical history significant for Parkinson disease, A. fib, hypertension recently diagnosed with congestive heart failure at [facility] presented to the emergency department complaining of shortness of breath.  Patient was prescribed Lasix but was not taking it as indicated, as someone told him that it is not good for the kidneys.  In the ED patient was found to be on fluid overload with x-ray consistent with pulmonary edema and bilateral leg edema.  Patient was admitted with working diagnosis of CHF exacerbation and was started on IV diuresis.  Subjective: Patient seen and examined, he continues to have good urine output.  Has no complaints.  Patient participated with physical therapy who recommended short-term rehab at [facility].  Patient denies shortness of breath, palpitations, dizziness and chest pain.  Discharge Diagnoses/Hospital Course:  Acute on chronic diastolic CHF exacerbation Patient was treated with IV Lasix, with good diuresis. Fluid balance with negative ~5 L Echocardiogram 4/1 shows EF 45-50% and grade 2 diastolic dysfunction Down 15 pounds during hospital stay Will discharge him on Lasix 60 mg daily Continue p.o. Metoprolol Follow-up with PCP, may need cardiology referral as an outpatient.  Pulmonary edema Cardiogenic due to CHF exacerbation Responded well to IV  Lasix Not requiring oxygen supplementation Ambulating with no shortness of breath Follow-up as an outpatient  Hypertension Blood pressure has been stable during hospital stay Continue home medications with no changes  Parkinson disease Stable Continue home medication  CKD stage III Baseline creatinine around 1.5 Avoid nephrotoxic agent and hypotension Continue to monitor urine output  Paroxysmal A. fib Rate controlled Continue beta-blocker Patient not on anticoagulation, unclear why his disease.  Will continue aspirin for now and discuss with PCP or cardiology if full anticoagulation needed as an outpatient.  All other chronic medical condition were stable during the hospitalization.  Patient was seen by physical therapy, commanding SNF for SRT On the day of the discharge the patient's vitals were stable, and no other acute medical condition were reported by patient. the patient was felt safe to be discharge to SNF  Discharge Instructions  You were cared for by a hospitalist during your hospital stay. If you have any questions about your discharge medications or the care you received while you were in the hospital after you are discharged, you can call the unit and asked to speak with the hospitalist on call if the hospitalist that took care of you is not available. Once you are discharged, your primary care physician will handle any further medical issues. Please note that NO REFILLS for any discharge medications will be authorized once you are discharged, as it is imperative that you return to your primary care physician (or establish a relationship with a primary care physician if you do not have one) for your aftercare needs so that they can reassess your need for medications and monitor your lab values.  Discharge Instructions    Call MD for:  difficulty breathing, headache or visual  disturbances   Complete by:  As directed    Call MD for:  extreme fatigue   Complete by:  As  directed    Call MD for:  hives   Complete by:  As directed    Call MD for:  persistant dizziness or light-headedness   Complete by:  As directed    Call MD for:  persistant nausea and vomiting   Complete by:  As directed    Call MD for:  redness, tenderness, or signs of infection (pain, swelling, redness, odor or green/yellow discharge around incision site)   Complete by:  As directed    Call MD for:  severe uncontrolled pain   Complete by:  As directed    Call MD for:  temperature >100.4   Complete by:  As directed    Diet - low sodium heart healthy   Complete by:  As directed    Increase activity slowly   Complete by:  As directed      Allergies as of 10/01/2017   No Known Allergies     Medication List    TAKE these medications   aspirin 81 MG EC tablet Take 1 tablet (81 mg total) by mouth daily. Start taking on:  10/02/2017   CALCIUM 600/VITAMIN D 600-400 MG-UNIT Tabs Generic drug:  Calcium Carbonate-Vitamin D3 Take 2 tablets by mouth daily.   carbidopa-levodopa 25-100 MG tablet Commonly known as:  SINEMET IR Take 2 tablets by mouth 3 (three) times daily.   cloNIDine 0.1 mg/24hr patch Commonly known as:  CATAPRES - Dosed in mg/24 hr Place 1 patch (0.1 mg total) onto the skin once a week.   folic acid 1 MG tablet Commonly known as:  FOLVITE Take 1 mg by mouth daily.   furosemide 40 MG tablet Commonly known as:  LASIX Take 1.5 tablets (60 mg total) by mouth daily.   hydroxypropyl methylcellulose / hypromellose 2.5 % ophthalmic solution Commonly known as:  ISOPTO TEARS / GONIOVISC Place 1 drop into both eyes 3 (three) times daily as needed. For dry eyes   metoprolol tartrate 50 MG tablet Commonly known as:  LOPRESSOR Take 25 mg by mouth 2 (two) times daily.      Contact information for after-discharge care    Destination    [facility] .   Service:  Skilled Nursing Contact information: [address] [place]  [id number] [phone]             No Known Allergies  Consultations:     Procedures/Studies: Dg Chest 2 View  Result Date: 09/28/2017 CLINICAL DATA:  84 year old male with shortness of breath. EXAM: CHEST - 2 VIEW COMPARISON:  Chest radiograph dated 01/03/2015 FINDINGS: There is mild cardiomegaly with mild vascular congestion. Small bilateral pleural effusions. There is no pneumothorax. The osseous structures are unremarkable. IMPRESSION: Cardiomegaly with mild vascular congestion and small bilateral pleural effusions. Clinical correlation is recommended. Electronically Signed   By: Elgie CollardArash  Radparvar M.D.   On: 09/28/2017 22:15   Echo 4/1 LVEF 45-50%, grade 2 diastolic dysfunction, no wall motion abnormalities   Discharge Exam: Vitals:   09/30/17 2336 10/01/17 0531  BP: (!) 151/68 (!) 155/79  Pulse:  77  Resp:  16  Temp:  98.6 F (37 C)  SpO2:  100%   Vitals:   09/30/17 1509 09/30/17 2049 09/30/17 2336 10/01/17 0531  BP: 138/81 (!) 163/77 (!) 151/68 (!) 155/79  Pulse: 66 70  77  Resp: 15 16  16  Temp: 99.5 F (37.5 C) 97.7 F (36.5 C)  98.6 F (37 C)  TempSrc: Oral Oral  Oral  SpO2: 98% 100%  100%  Weight:    88.8 kg (195 lb 12.3 oz)  Height:        General: Pt is alert, awake, not in acute distress Cardiovascular: RRR, S1/S2 +, no rubs, no gallops Respiratory: Good air entry, mild bibasilar crackles Abdominal: Soft, NT, ND, bowel sounds + Extremities: Bilateral lower extremity trace edema Neurology: Resting tremor on right hand  The results of significant diagnostics from this hospitalization (including imaging, microbiology, ancillary and laboratory) are listed below for reference.     Microbiology: No results found for this or any previous visit (from the past 240 hour(s)).   Labs: BNP (last 3 results) Recent Labs    09/28/17 2030  BNP 1,677.0*   Basic Metabolic Panel: Recent Labs  Lab 09/28/17 2030 09/29/17 0508 09/30/17 0530 10/01/17 0515   NA 145 143 141 142  K 4.4 4.0 4.1 4.1  CL 108 108 104 102  CO2 27 25 27 30   GLUCOSE 114* 99 124* 103*  BUN 33* 30* 28* 24*  CREATININE 1.56* 1.49* 1.51* 1.42*  CALCIUM 8.8* 8.9 9.2 9.3   Liver Function Tests: No results for input(s): AST, ALT, ALKPHOS, BILITOT, PROT, ALBUMIN in the last 168 hours. No results for input(s): LIPASE, AMYLASE in the last 168 hours. No results for input(s): AMMONIA in the last 168 hours. CBC: Recent Labs  Lab 09/28/17 2030 09/30/17 0530  WBC 5.1 5.4  HGB 9.7* 10.4*  HCT 32.3* 33.9*  MCV 92.8 91.4  PLT 141* 154   Cardiac Enzymes: No results for input(s): CKTOTAL, CKMB, CKMBINDEX, TROPONINI in the last 168 hours. BNP: Invalid input(s): POCBNP CBG: No results for input(s): GLUCAP in the last 168 hours. D-Dimer No results for input(s): DDIMER in the last 72 hours. Hgb A1c No results for input(s): HGBA1C in the last 72 hours. Lipid Profile No results for input(s): CHOL, HDL, LDLCALC, TRIG, CHOLHDL, LDLDIRECT in the last 72 hours. Thyroid function studies No results for input(s): TSH, T4TOTAL, T3FREE, THYROIDAB in the last 72 hours.  Invalid input(s): FREET3 Anemia work up No results for input(s): VITAMINB12, FOLATE, FERRITIN, TIBC, IRON, RETICCTPCT in the last 72 hours. Urinalysis    Component Value Date/Time   COLORURINE YELLOW 07/21/2016 1405   APPEARANCEUR CLEAR 07/21/2016 1405   LABSPEC 1.042 (H) 07/21/2016 1405   PHURINE 5.0 07/21/2016 1405   GLUCOSEU NEGATIVE 07/21/2016 1405   HGBUR NEGATIVE 07/21/2016 1405   BILIRUBINUR NEGATIVE 07/21/2016 1405   KETONESUR NEGATIVE 07/21/2016 1405   PROTEINUR 30 (A) 07/21/2016 1405   NITRITE NEGATIVE 07/21/2016 1405   LEUKOCYTESUR NEGATIVE 07/21/2016 1405   Sepsis Labs Invalid input(s): PROCALCITONIN,  WBC,  LACTICIDVEN Microbiology No results found for this or any previous visit (from the past 240 hour(s)).   Time coordinating discharge: 35 minutes  SIGNED:  Latrelle Dodrill, MD  [facility] 10/01/2017, 1:15 PM  Pager please text page via  [email or web address]  Note - This record has been created using [employer]. Chart creation errors have been sought, but may not always have been located. Such creation errors do not reflect on the standard of medical care.

## 2017-10-01 NOTE — Clinical Social Work Placement (Addendum)
English as a second language teachernsurance Authorization received.  Nurse given number to call report: [phone] Room 128A PTAR to transport.  PTAR arranged for 2:30 pick up/Daughter will meet patient at facility.   CLINICAL SOCIAL WORK PLACEMENT  NOTE  Date:  10/01/2017  Patient Details  Name: Kevin Roman MRN: [id number] Date of Birth: 1930-04-14  Clinical Social Work is seeking post-discharge placement for this patient at the Skilled  Nursing Facility level of care (*CSW will initial, date and re-position this form in  chart as items are completed):  Yes   Patient/family provided with [facility] Clinical Social Work Department's list of facilities offering this level of care within the geographic area requested by the patient (or if unable, by the patient's family).  Yes   Patient/family informed of their freedom to choose among providers that offer the needed level of care, that participate in Medicare, Medicaid or managed care program needed by the patient, have an available bed and are willing to accept the patient.  Yes   Patient/family informed of [facility]'s ownership interest in [facility] and [facility], as well as of the fact that they are under no obligation to receive care at these facilities.  PASRR submitted to EDS on       PASRR number received on       Existing PASRR number confirmed on 09/30/17     FL2 transmitted to all facilities in geographic area requested by pt/family on       FL2 transmitted to all facilities within larger geographic area on 10/01/17     Patient informed that his/her managed care company has contracts with or will negotiate with certain facilities, including the following:  [facility]     Yes   Patient/family informed of bed offers received.  [facility]     Physician recommends and patient chooses bed at      [facility].  Patient to be transferred to facility  by PTAR     Patient family notified on 10/01/17 of transfer.  Name of family member notified:  Spouse and Daughter     PHYSICIAN Please prepare priority discharge summary, including medications     Additional Comment:    _______________________________________________ Clearance CootsNicole A Gini Caputo, LCSW 10/01/2017, 11:35 AM

## 2017-10-09 DIAGNOSIS — G20 Parkinson's disease: Secondary | ICD-10-CM | POA: Diagnosis not present

## 2017-10-09 DIAGNOSIS — I480 Paroxysmal atrial fibrillation: Secondary | ICD-10-CM | POA: Diagnosis not present

## 2017-10-09 DIAGNOSIS — N183 Chronic kidney disease, stage 3 (moderate): Secondary | ICD-10-CM | POA: Diagnosis not present

## 2017-10-09 DIAGNOSIS — I10 Essential (primary) hypertension: Secondary | ICD-10-CM | POA: Diagnosis not present

## 2017-10-15 DIAGNOSIS — I5033 Acute on chronic diastolic (congestive) heart failure: Secondary | ICD-10-CM | POA: Diagnosis not present

## 2017-10-15 DIAGNOSIS — I480 Paroxysmal atrial fibrillation: Secondary | ICD-10-CM | POA: Diagnosis not present

## 2017-10-15 DIAGNOSIS — G20 Parkinson's disease: Secondary | ICD-10-CM | POA: Diagnosis not present

## 2017-10-15 DIAGNOSIS — N183 Chronic kidney disease, stage 3 (moderate): Secondary | ICD-10-CM | POA: Diagnosis not present

## 2017-10-15 DIAGNOSIS — R2689 Other abnormalities of gait and mobility: Secondary | ICD-10-CM | POA: Diagnosis not present

## 2017-10-19 DIAGNOSIS — I130 Hypertensive heart and chronic kidney disease with heart failure and stage 1 through stage 4 chronic kidney disease, or unspecified chronic kidney disease: Secondary | ICD-10-CM | POA: Diagnosis not present

## 2017-10-19 DIAGNOSIS — I482 Chronic atrial fibrillation: Secondary | ICD-10-CM | POA: Diagnosis not present

## 2017-10-19 DIAGNOSIS — N183 Chronic kidney disease, stage 3 (moderate): Secondary | ICD-10-CM | POA: Diagnosis not present

## 2017-10-19 DIAGNOSIS — N4400 Torsion of testis, unspecified: Secondary | ICD-10-CM | POA: Diagnosis not present

## 2017-10-19 DIAGNOSIS — G20 Parkinson's disease: Secondary | ICD-10-CM | POA: Diagnosis not present

## 2017-10-19 DIAGNOSIS — Z7982 Long term (current) use of aspirin: Secondary | ICD-10-CM | POA: Diagnosis not present

## 2017-10-19 DIAGNOSIS — Z9181 History of falling: Secondary | ICD-10-CM | POA: Diagnosis not present

## 2017-10-19 DIAGNOSIS — D631 Anemia in chronic kidney disease: Secondary | ICD-10-CM | POA: Diagnosis not present

## 2017-10-19 DIAGNOSIS — I5033 Acute on chronic diastolic (congestive) heart failure: Secondary | ICD-10-CM | POA: Diagnosis not present

## 2017-10-21 DIAGNOSIS — I482 Chronic atrial fibrillation: Secondary | ICD-10-CM | POA: Diagnosis not present

## 2017-10-21 DIAGNOSIS — I5033 Acute on chronic diastolic (congestive) heart failure: Secondary | ICD-10-CM | POA: Diagnosis not present

## 2017-10-21 DIAGNOSIS — I130 Hypertensive heart and chronic kidney disease with heart failure and stage 1 through stage 4 chronic kidney disease, or unspecified chronic kidney disease: Secondary | ICD-10-CM | POA: Diagnosis not present

## 2017-10-21 DIAGNOSIS — N4400 Torsion of testis, unspecified: Secondary | ICD-10-CM | POA: Diagnosis not present

## 2017-10-21 DIAGNOSIS — Z9181 History of falling: Secondary | ICD-10-CM | POA: Diagnosis not present

## 2017-10-21 DIAGNOSIS — N183 Chronic kidney disease, stage 3 (moderate): Secondary | ICD-10-CM | POA: Diagnosis not present

## 2017-10-21 DIAGNOSIS — G20 Parkinson's disease: Secondary | ICD-10-CM | POA: Diagnosis not present

## 2017-10-21 DIAGNOSIS — Z7982 Long term (current) use of aspirin: Secondary | ICD-10-CM | POA: Diagnosis not present

## 2017-10-21 DIAGNOSIS — D631 Anemia in chronic kidney disease: Secondary | ICD-10-CM | POA: Diagnosis not present

## 2017-10-23 DIAGNOSIS — Z9181 History of falling: Secondary | ICD-10-CM | POA: Diagnosis not present

## 2017-10-23 DIAGNOSIS — D631 Anemia in chronic kidney disease: Secondary | ICD-10-CM | POA: Diagnosis not present

## 2017-10-23 DIAGNOSIS — I5033 Acute on chronic diastolic (congestive) heart failure: Secondary | ICD-10-CM | POA: Diagnosis not present

## 2017-10-23 DIAGNOSIS — G20 Parkinson's disease: Secondary | ICD-10-CM | POA: Diagnosis not present

## 2017-10-23 DIAGNOSIS — N183 Chronic kidney disease, stage 3 (moderate): Secondary | ICD-10-CM | POA: Diagnosis not present

## 2017-10-23 DIAGNOSIS — N4400 Torsion of testis, unspecified: Secondary | ICD-10-CM | POA: Diagnosis not present

## 2017-10-23 DIAGNOSIS — I482 Chronic atrial fibrillation: Secondary | ICD-10-CM | POA: Diagnosis not present

## 2017-10-23 DIAGNOSIS — I130 Hypertensive heart and chronic kidney disease with heart failure and stage 1 through stage 4 chronic kidney disease, or unspecified chronic kidney disease: Secondary | ICD-10-CM | POA: Diagnosis not present

## 2017-10-23 DIAGNOSIS — Z7982 Long term (current) use of aspirin: Secondary | ICD-10-CM | POA: Diagnosis not present

## 2017-10-28 DIAGNOSIS — I130 Hypertensive heart and chronic kidney disease with heart failure and stage 1 through stage 4 chronic kidney disease, or unspecified chronic kidney disease: Secondary | ICD-10-CM | POA: Diagnosis not present

## 2017-10-28 DIAGNOSIS — I482 Chronic atrial fibrillation: Secondary | ICD-10-CM | POA: Diagnosis not present

## 2017-10-28 DIAGNOSIS — Z9181 History of falling: Secondary | ICD-10-CM | POA: Diagnosis not present

## 2017-10-28 DIAGNOSIS — I5033 Acute on chronic diastolic (congestive) heart failure: Secondary | ICD-10-CM | POA: Diagnosis not present

## 2017-10-28 DIAGNOSIS — N183 Chronic kidney disease, stage 3 (moderate): Secondary | ICD-10-CM | POA: Diagnosis not present

## 2017-10-28 DIAGNOSIS — N4400 Torsion of testis, unspecified: Secondary | ICD-10-CM | POA: Diagnosis not present

## 2017-10-28 DIAGNOSIS — G20 Parkinson's disease: Secondary | ICD-10-CM | POA: Diagnosis not present

## 2017-10-28 DIAGNOSIS — D631 Anemia in chronic kidney disease: Secondary | ICD-10-CM | POA: Diagnosis not present

## 2017-10-28 DIAGNOSIS — Z7982 Long term (current) use of aspirin: Secondary | ICD-10-CM | POA: Diagnosis not present

## 2017-10-29 DIAGNOSIS — N183 Chronic kidney disease, stage 3 (moderate): Secondary | ICD-10-CM | POA: Diagnosis not present

## 2017-10-29 DIAGNOSIS — I482 Chronic atrial fibrillation: Secondary | ICD-10-CM | POA: Diagnosis not present

## 2017-10-29 DIAGNOSIS — I5033 Acute on chronic diastolic (congestive) heart failure: Secondary | ICD-10-CM | POA: Diagnosis not present

## 2017-10-29 DIAGNOSIS — G20 Parkinson's disease: Secondary | ICD-10-CM | POA: Diagnosis not present

## 2017-10-29 DIAGNOSIS — Z7982 Long term (current) use of aspirin: Secondary | ICD-10-CM | POA: Diagnosis not present

## 2017-10-29 DIAGNOSIS — Z9181 History of falling: Secondary | ICD-10-CM | POA: Diagnosis not present

## 2017-10-29 DIAGNOSIS — I130 Hypertensive heart and chronic kidney disease with heart failure and stage 1 through stage 4 chronic kidney disease, or unspecified chronic kidney disease: Secondary | ICD-10-CM | POA: Diagnosis not present

## 2017-10-29 DIAGNOSIS — N4400 Torsion of testis, unspecified: Secondary | ICD-10-CM | POA: Diagnosis not present

## 2017-10-29 DIAGNOSIS — D631 Anemia in chronic kidney disease: Secondary | ICD-10-CM | POA: Diagnosis not present

## 2017-10-30 DIAGNOSIS — Z9181 History of falling: Secondary | ICD-10-CM | POA: Diagnosis not present

## 2017-10-30 DIAGNOSIS — N4400 Torsion of testis, unspecified: Secondary | ICD-10-CM | POA: Diagnosis not present

## 2017-10-30 DIAGNOSIS — N183 Chronic kidney disease, stage 3 (moderate): Secondary | ICD-10-CM | POA: Diagnosis not present

## 2017-10-30 DIAGNOSIS — D631 Anemia in chronic kidney disease: Secondary | ICD-10-CM | POA: Diagnosis not present

## 2017-10-30 DIAGNOSIS — I482 Chronic atrial fibrillation: Secondary | ICD-10-CM | POA: Diagnosis not present

## 2017-10-30 DIAGNOSIS — Z7982 Long term (current) use of aspirin: Secondary | ICD-10-CM | POA: Diagnosis not present

## 2017-10-30 DIAGNOSIS — I130 Hypertensive heart and chronic kidney disease with heart failure and stage 1 through stage 4 chronic kidney disease, or unspecified chronic kidney disease: Secondary | ICD-10-CM | POA: Diagnosis not present

## 2017-10-30 DIAGNOSIS — I5033 Acute on chronic diastolic (congestive) heart failure: Secondary | ICD-10-CM | POA: Diagnosis not present

## 2017-10-30 DIAGNOSIS — G20 Parkinson's disease: Secondary | ICD-10-CM | POA: Diagnosis not present

## 2017-10-31 DIAGNOSIS — I482 Chronic atrial fibrillation: Secondary | ICD-10-CM | POA: Diagnosis not present

## 2017-10-31 DIAGNOSIS — D631 Anemia in chronic kidney disease: Secondary | ICD-10-CM | POA: Diagnosis not present

## 2017-10-31 DIAGNOSIS — G20 Parkinson's disease: Secondary | ICD-10-CM | POA: Diagnosis not present

## 2017-10-31 DIAGNOSIS — I5033 Acute on chronic diastolic (congestive) heart failure: Secondary | ICD-10-CM | POA: Diagnosis not present

## 2017-10-31 DIAGNOSIS — I130 Hypertensive heart and chronic kidney disease with heart failure and stage 1 through stage 4 chronic kidney disease, or unspecified chronic kidney disease: Secondary | ICD-10-CM | POA: Diagnosis not present

## 2017-10-31 DIAGNOSIS — N4400 Torsion of testis, unspecified: Secondary | ICD-10-CM | POA: Diagnosis not present

## 2017-10-31 DIAGNOSIS — Z9181 History of falling: Secondary | ICD-10-CM | POA: Diagnosis not present

## 2017-10-31 DIAGNOSIS — N183 Chronic kidney disease, stage 3 (moderate): Secondary | ICD-10-CM | POA: Diagnosis not present

## 2017-10-31 DIAGNOSIS — Z7982 Long term (current) use of aspirin: Secondary | ICD-10-CM | POA: Diagnosis not present

## 2017-11-04 DIAGNOSIS — D631 Anemia in chronic kidney disease: Secondary | ICD-10-CM | POA: Diagnosis not present

## 2017-11-04 DIAGNOSIS — Z9181 History of falling: Secondary | ICD-10-CM | POA: Diagnosis not present

## 2017-11-04 DIAGNOSIS — N4400 Torsion of testis, unspecified: Secondary | ICD-10-CM | POA: Diagnosis not present

## 2017-11-04 DIAGNOSIS — I482 Chronic atrial fibrillation: Secondary | ICD-10-CM | POA: Diagnosis not present

## 2017-11-04 DIAGNOSIS — Z7982 Long term (current) use of aspirin: Secondary | ICD-10-CM | POA: Diagnosis not present

## 2017-11-04 DIAGNOSIS — I5033 Acute on chronic diastolic (congestive) heart failure: Secondary | ICD-10-CM | POA: Diagnosis not present

## 2017-11-04 DIAGNOSIS — N183 Chronic kidney disease, stage 3 (moderate): Secondary | ICD-10-CM | POA: Diagnosis not present

## 2017-11-04 DIAGNOSIS — I130 Hypertensive heart and chronic kidney disease with heart failure and stage 1 through stage 4 chronic kidney disease, or unspecified chronic kidney disease: Secondary | ICD-10-CM | POA: Diagnosis not present

## 2017-11-04 DIAGNOSIS — G20 Parkinson's disease: Secondary | ICD-10-CM | POA: Diagnosis not present

## 2017-11-05 DIAGNOSIS — I5033 Acute on chronic diastolic (congestive) heart failure: Secondary | ICD-10-CM | POA: Diagnosis not present

## 2017-11-05 DIAGNOSIS — Z9181 History of falling: Secondary | ICD-10-CM | POA: Diagnosis not present

## 2017-11-05 DIAGNOSIS — N4400 Torsion of testis, unspecified: Secondary | ICD-10-CM | POA: Diagnosis not present

## 2017-11-05 DIAGNOSIS — Z7982 Long term (current) use of aspirin: Secondary | ICD-10-CM | POA: Diagnosis not present

## 2017-11-05 DIAGNOSIS — I130 Hypertensive heart and chronic kidney disease with heart failure and stage 1 through stage 4 chronic kidney disease, or unspecified chronic kidney disease: Secondary | ICD-10-CM | POA: Diagnosis not present

## 2017-11-05 DIAGNOSIS — G20 Parkinson's disease: Secondary | ICD-10-CM | POA: Diagnosis not present

## 2017-11-05 DIAGNOSIS — I482 Chronic atrial fibrillation: Secondary | ICD-10-CM | POA: Diagnosis not present

## 2017-11-05 DIAGNOSIS — D631 Anemia in chronic kidney disease: Secondary | ICD-10-CM | POA: Diagnosis not present

## 2017-11-05 DIAGNOSIS — N183 Chronic kidney disease, stage 3 (moderate): Secondary | ICD-10-CM | POA: Diagnosis not present

## 2017-11-06 DIAGNOSIS — Z9181 History of falling: Secondary | ICD-10-CM | POA: Diagnosis not present

## 2017-11-06 DIAGNOSIS — I5033 Acute on chronic diastolic (congestive) heart failure: Secondary | ICD-10-CM | POA: Diagnosis not present

## 2017-11-06 DIAGNOSIS — N183 Chronic kidney disease, stage 3 (moderate): Secondary | ICD-10-CM | POA: Diagnosis not present

## 2017-11-06 DIAGNOSIS — D631 Anemia in chronic kidney disease: Secondary | ICD-10-CM | POA: Diagnosis not present

## 2017-11-06 DIAGNOSIS — I482 Chronic atrial fibrillation: Secondary | ICD-10-CM | POA: Diagnosis not present

## 2017-11-06 DIAGNOSIS — G20 Parkinson's disease: Secondary | ICD-10-CM | POA: Diagnosis not present

## 2017-11-06 DIAGNOSIS — I130 Hypertensive heart and chronic kidney disease with heart failure and stage 1 through stage 4 chronic kidney disease, or unspecified chronic kidney disease: Secondary | ICD-10-CM | POA: Diagnosis not present

## 2017-11-06 DIAGNOSIS — N4400 Torsion of testis, unspecified: Secondary | ICD-10-CM | POA: Diagnosis not present

## 2017-11-06 DIAGNOSIS — Z7982 Long term (current) use of aspirin: Secondary | ICD-10-CM | POA: Diagnosis not present

## 2017-11-07 DIAGNOSIS — G20 Parkinson's disease: Secondary | ICD-10-CM | POA: Diagnosis not present

## 2017-11-07 DIAGNOSIS — I5033 Acute on chronic diastolic (congestive) heart failure: Secondary | ICD-10-CM | POA: Diagnosis not present

## 2017-11-07 DIAGNOSIS — I482 Chronic atrial fibrillation: Secondary | ICD-10-CM | POA: Diagnosis not present

## 2017-11-07 DIAGNOSIS — N4400 Torsion of testis, unspecified: Secondary | ICD-10-CM | POA: Diagnosis not present

## 2017-11-07 DIAGNOSIS — Z9181 History of falling: Secondary | ICD-10-CM | POA: Diagnosis not present

## 2017-11-07 DIAGNOSIS — N183 Chronic kidney disease, stage 3 (moderate): Secondary | ICD-10-CM | POA: Diagnosis not present

## 2017-11-07 DIAGNOSIS — I130 Hypertensive heart and chronic kidney disease with heart failure and stage 1 through stage 4 chronic kidney disease, or unspecified chronic kidney disease: Secondary | ICD-10-CM | POA: Diagnosis not present

## 2017-11-07 DIAGNOSIS — D631 Anemia in chronic kidney disease: Secondary | ICD-10-CM | POA: Diagnosis not present

## 2017-11-07 DIAGNOSIS — Z7982 Long term (current) use of aspirin: Secondary | ICD-10-CM | POA: Diagnosis not present

## 2017-11-10 DIAGNOSIS — N183 Chronic kidney disease, stage 3 (moderate): Secondary | ICD-10-CM | POA: Diagnosis not present

## 2017-11-10 DIAGNOSIS — I5033 Acute on chronic diastolic (congestive) heart failure: Secondary | ICD-10-CM | POA: Diagnosis not present

## 2017-11-10 DIAGNOSIS — I482 Chronic atrial fibrillation: Secondary | ICD-10-CM | POA: Diagnosis not present

## 2017-11-10 DIAGNOSIS — D631 Anemia in chronic kidney disease: Secondary | ICD-10-CM | POA: Diagnosis not present

## 2017-11-10 DIAGNOSIS — I130 Hypertensive heart and chronic kidney disease with heart failure and stage 1 through stage 4 chronic kidney disease, or unspecified chronic kidney disease: Secondary | ICD-10-CM | POA: Diagnosis not present

## 2017-11-10 DIAGNOSIS — Z9181 History of falling: Secondary | ICD-10-CM | POA: Diagnosis not present

## 2017-11-10 DIAGNOSIS — G20 Parkinson's disease: Secondary | ICD-10-CM | POA: Diagnosis not present

## 2017-11-10 DIAGNOSIS — Z7982 Long term (current) use of aspirin: Secondary | ICD-10-CM | POA: Diagnosis not present

## 2017-11-10 DIAGNOSIS — N4400 Torsion of testis, unspecified: Secondary | ICD-10-CM | POA: Diagnosis not present

## 2017-11-11 DIAGNOSIS — I482 Chronic atrial fibrillation: Secondary | ICD-10-CM | POA: Diagnosis not present

## 2017-11-11 DIAGNOSIS — I5033 Acute on chronic diastolic (congestive) heart failure: Secondary | ICD-10-CM | POA: Diagnosis not present

## 2017-11-11 DIAGNOSIS — N183 Chronic kidney disease, stage 3 (moderate): Secondary | ICD-10-CM | POA: Diagnosis not present

## 2017-11-11 DIAGNOSIS — Z7982 Long term (current) use of aspirin: Secondary | ICD-10-CM | POA: Diagnosis not present

## 2017-11-11 DIAGNOSIS — G20 Parkinson's disease: Secondary | ICD-10-CM | POA: Diagnosis not present

## 2017-11-11 DIAGNOSIS — D631 Anemia in chronic kidney disease: Secondary | ICD-10-CM | POA: Diagnosis not present

## 2017-11-11 DIAGNOSIS — Z9181 History of falling: Secondary | ICD-10-CM | POA: Diagnosis not present

## 2017-11-11 DIAGNOSIS — N4400 Torsion of testis, unspecified: Secondary | ICD-10-CM | POA: Diagnosis not present

## 2017-11-11 DIAGNOSIS — I130 Hypertensive heart and chronic kidney disease with heart failure and stage 1 through stage 4 chronic kidney disease, or unspecified chronic kidney disease: Secondary | ICD-10-CM | POA: Diagnosis not present

## 2017-11-12 DIAGNOSIS — I5033 Acute on chronic diastolic (congestive) heart failure: Secondary | ICD-10-CM | POA: Diagnosis not present

## 2017-11-12 DIAGNOSIS — I482 Chronic atrial fibrillation: Secondary | ICD-10-CM | POA: Diagnosis not present

## 2017-11-12 DIAGNOSIS — Z7982 Long term (current) use of aspirin: Secondary | ICD-10-CM | POA: Diagnosis not present

## 2017-11-12 DIAGNOSIS — I130 Hypertensive heart and chronic kidney disease with heart failure and stage 1 through stage 4 chronic kidney disease, or unspecified chronic kidney disease: Secondary | ICD-10-CM | POA: Diagnosis not present

## 2017-11-12 DIAGNOSIS — Z9181 History of falling: Secondary | ICD-10-CM | POA: Diagnosis not present

## 2017-11-12 DIAGNOSIS — N183 Chronic kidney disease, stage 3 (moderate): Secondary | ICD-10-CM | POA: Diagnosis not present

## 2017-11-12 DIAGNOSIS — G20 Parkinson's disease: Secondary | ICD-10-CM | POA: Diagnosis not present

## 2017-11-12 DIAGNOSIS — D631 Anemia in chronic kidney disease: Secondary | ICD-10-CM | POA: Diagnosis not present

## 2017-11-12 DIAGNOSIS — N4400 Torsion of testis, unspecified: Secondary | ICD-10-CM | POA: Diagnosis not present

## 2017-11-14 DIAGNOSIS — N183 Chronic kidney disease, stage 3 (moderate): Secondary | ICD-10-CM | POA: Diagnosis not present

## 2017-11-14 DIAGNOSIS — D631 Anemia in chronic kidney disease: Secondary | ICD-10-CM | POA: Diagnosis not present

## 2017-11-14 DIAGNOSIS — N4400 Torsion of testis, unspecified: Secondary | ICD-10-CM | POA: Diagnosis not present

## 2017-11-14 DIAGNOSIS — Z7982 Long term (current) use of aspirin: Secondary | ICD-10-CM | POA: Diagnosis not present

## 2017-11-14 DIAGNOSIS — I130 Hypertensive heart and chronic kidney disease with heart failure and stage 1 through stage 4 chronic kidney disease, or unspecified chronic kidney disease: Secondary | ICD-10-CM | POA: Diagnosis not present

## 2017-11-14 DIAGNOSIS — I482 Chronic atrial fibrillation: Secondary | ICD-10-CM | POA: Diagnosis not present

## 2017-11-14 DIAGNOSIS — Z9181 History of falling: Secondary | ICD-10-CM | POA: Diagnosis not present

## 2017-11-14 DIAGNOSIS — I5033 Acute on chronic diastolic (congestive) heart failure: Secondary | ICD-10-CM | POA: Diagnosis not present

## 2017-11-14 DIAGNOSIS — G20 Parkinson's disease: Secondary | ICD-10-CM | POA: Diagnosis not present

## 2017-11-17 DIAGNOSIS — Z9181 History of falling: Secondary | ICD-10-CM | POA: Diagnosis not present

## 2017-11-17 DIAGNOSIS — N183 Chronic kidney disease, stage 3 (moderate): Secondary | ICD-10-CM | POA: Diagnosis not present

## 2017-11-17 DIAGNOSIS — N4400 Torsion of testis, unspecified: Secondary | ICD-10-CM | POA: Diagnosis not present

## 2017-11-17 DIAGNOSIS — D631 Anemia in chronic kidney disease: Secondary | ICD-10-CM | POA: Diagnosis not present

## 2017-11-17 DIAGNOSIS — I5033 Acute on chronic diastolic (congestive) heart failure: Secondary | ICD-10-CM | POA: Diagnosis not present

## 2017-11-17 DIAGNOSIS — Z7982 Long term (current) use of aspirin: Secondary | ICD-10-CM | POA: Diagnosis not present

## 2017-11-17 DIAGNOSIS — I482 Chronic atrial fibrillation: Secondary | ICD-10-CM | POA: Diagnosis not present

## 2017-11-17 DIAGNOSIS — I130 Hypertensive heart and chronic kidney disease with heart failure and stage 1 through stage 4 chronic kidney disease, or unspecified chronic kidney disease: Secondary | ICD-10-CM | POA: Diagnosis not present

## 2017-11-17 DIAGNOSIS — G20 Parkinson's disease: Secondary | ICD-10-CM | POA: Diagnosis not present

## 2017-11-19 DIAGNOSIS — I130 Hypertensive heart and chronic kidney disease with heart failure and stage 1 through stage 4 chronic kidney disease, or unspecified chronic kidney disease: Secondary | ICD-10-CM | POA: Diagnosis not present

## 2017-11-19 DIAGNOSIS — I5033 Acute on chronic diastolic (congestive) heart failure: Secondary | ICD-10-CM | POA: Diagnosis not present

## 2017-11-19 DIAGNOSIS — N183 Chronic kidney disease, stage 3 (moderate): Secondary | ICD-10-CM | POA: Diagnosis not present

## 2017-11-19 DIAGNOSIS — Z7982 Long term (current) use of aspirin: Secondary | ICD-10-CM | POA: Diagnosis not present

## 2017-11-19 DIAGNOSIS — I482 Chronic atrial fibrillation: Secondary | ICD-10-CM | POA: Diagnosis not present

## 2017-11-19 DIAGNOSIS — N4400 Torsion of testis, unspecified: Secondary | ICD-10-CM | POA: Diagnosis not present

## 2017-11-19 DIAGNOSIS — G20 Parkinson's disease: Secondary | ICD-10-CM | POA: Diagnosis not present

## 2017-11-19 DIAGNOSIS — D631 Anemia in chronic kidney disease: Secondary | ICD-10-CM | POA: Diagnosis not present

## 2017-11-19 DIAGNOSIS — Z9181 History of falling: Secondary | ICD-10-CM | POA: Diagnosis not present

## 2017-11-21 DIAGNOSIS — N183 Chronic kidney disease, stage 3 (moderate): Secondary | ICD-10-CM | POA: Diagnosis not present

## 2017-11-21 DIAGNOSIS — N4400 Torsion of testis, unspecified: Secondary | ICD-10-CM | POA: Diagnosis not present

## 2017-11-21 DIAGNOSIS — I5033 Acute on chronic diastolic (congestive) heart failure: Secondary | ICD-10-CM | POA: Diagnosis not present

## 2017-11-21 DIAGNOSIS — Z7982 Long term (current) use of aspirin: Secondary | ICD-10-CM | POA: Diagnosis not present

## 2017-11-21 DIAGNOSIS — D631 Anemia in chronic kidney disease: Secondary | ICD-10-CM | POA: Diagnosis not present

## 2017-11-21 DIAGNOSIS — I482 Chronic atrial fibrillation: Secondary | ICD-10-CM | POA: Diagnosis not present

## 2017-11-21 DIAGNOSIS — Z9181 History of falling: Secondary | ICD-10-CM | POA: Diagnosis not present

## 2017-11-21 DIAGNOSIS — I130 Hypertensive heart and chronic kidney disease with heart failure and stage 1 through stage 4 chronic kidney disease, or unspecified chronic kidney disease: Secondary | ICD-10-CM | POA: Diagnosis not present

## 2017-11-21 DIAGNOSIS — G20 Parkinson's disease: Secondary | ICD-10-CM | POA: Diagnosis not present

## 2017-11-26 DIAGNOSIS — I482 Chronic atrial fibrillation: Secondary | ICD-10-CM | POA: Diagnosis not present

## 2017-11-26 DIAGNOSIS — Z7982 Long term (current) use of aspirin: Secondary | ICD-10-CM | POA: Diagnosis not present

## 2017-11-26 DIAGNOSIS — G20 Parkinson's disease: Secondary | ICD-10-CM | POA: Diagnosis not present

## 2017-11-26 DIAGNOSIS — I5033 Acute on chronic diastolic (congestive) heart failure: Secondary | ICD-10-CM | POA: Diagnosis not present

## 2017-11-26 DIAGNOSIS — Z9181 History of falling: Secondary | ICD-10-CM | POA: Diagnosis not present

## 2017-11-26 DIAGNOSIS — I130 Hypertensive heart and chronic kidney disease with heart failure and stage 1 through stage 4 chronic kidney disease, or unspecified chronic kidney disease: Secondary | ICD-10-CM | POA: Diagnosis not present

## 2017-11-26 DIAGNOSIS — N4400 Torsion of testis, unspecified: Secondary | ICD-10-CM | POA: Diagnosis not present

## 2017-11-26 DIAGNOSIS — N183 Chronic kidney disease, stage 3 (moderate): Secondary | ICD-10-CM | POA: Diagnosis not present

## 2017-11-26 DIAGNOSIS — D631 Anemia in chronic kidney disease: Secondary | ICD-10-CM | POA: Diagnosis not present

## 2017-11-27 DIAGNOSIS — N4400 Torsion of testis, unspecified: Secondary | ICD-10-CM | POA: Diagnosis not present

## 2017-11-27 DIAGNOSIS — D631 Anemia in chronic kidney disease: Secondary | ICD-10-CM | POA: Diagnosis not present

## 2017-11-27 DIAGNOSIS — I5033 Acute on chronic diastolic (congestive) heart failure: Secondary | ICD-10-CM | POA: Diagnosis not present

## 2017-11-27 DIAGNOSIS — I482 Chronic atrial fibrillation: Secondary | ICD-10-CM | POA: Diagnosis not present

## 2017-11-27 DIAGNOSIS — Z9181 History of falling: Secondary | ICD-10-CM | POA: Diagnosis not present

## 2017-11-27 DIAGNOSIS — Z7982 Long term (current) use of aspirin: Secondary | ICD-10-CM | POA: Diagnosis not present

## 2017-11-27 DIAGNOSIS — I130 Hypertensive heart and chronic kidney disease with heart failure and stage 1 through stage 4 chronic kidney disease, or unspecified chronic kidney disease: Secondary | ICD-10-CM | POA: Diagnosis not present

## 2017-11-27 DIAGNOSIS — G20 Parkinson's disease: Secondary | ICD-10-CM | POA: Diagnosis not present

## 2017-11-27 DIAGNOSIS — N183 Chronic kidney disease, stage 3 (moderate): Secondary | ICD-10-CM | POA: Diagnosis not present

## 2017-11-28 DIAGNOSIS — I130 Hypertensive heart and chronic kidney disease with heart failure and stage 1 through stage 4 chronic kidney disease, or unspecified chronic kidney disease: Secondary | ICD-10-CM | POA: Diagnosis not present

## 2017-11-28 DIAGNOSIS — N4400 Torsion of testis, unspecified: Secondary | ICD-10-CM | POA: Diagnosis not present

## 2017-11-28 DIAGNOSIS — D631 Anemia in chronic kidney disease: Secondary | ICD-10-CM | POA: Diagnosis not present

## 2017-11-28 DIAGNOSIS — I5033 Acute on chronic diastolic (congestive) heart failure: Secondary | ICD-10-CM | POA: Diagnosis not present

## 2017-11-28 DIAGNOSIS — Z7982 Long term (current) use of aspirin: Secondary | ICD-10-CM | POA: Diagnosis not present

## 2017-11-28 DIAGNOSIS — Z9181 History of falling: Secondary | ICD-10-CM | POA: Diagnosis not present

## 2017-11-28 DIAGNOSIS — I482 Chronic atrial fibrillation: Secondary | ICD-10-CM | POA: Diagnosis not present

## 2017-11-28 DIAGNOSIS — G20 Parkinson's disease: Secondary | ICD-10-CM | POA: Diagnosis not present

## 2017-11-28 DIAGNOSIS — N183 Chronic kidney disease, stage 3 (moderate): Secondary | ICD-10-CM | POA: Diagnosis not present

## 2017-12-01 DIAGNOSIS — D631 Anemia in chronic kidney disease: Secondary | ICD-10-CM | POA: Diagnosis not present

## 2017-12-01 DIAGNOSIS — I130 Hypertensive heart and chronic kidney disease with heart failure and stage 1 through stage 4 chronic kidney disease, or unspecified chronic kidney disease: Secondary | ICD-10-CM | POA: Diagnosis not present

## 2017-12-01 DIAGNOSIS — G20 Parkinson's disease: Secondary | ICD-10-CM | POA: Diagnosis not present

## 2017-12-01 DIAGNOSIS — N183 Chronic kidney disease, stage 3 (moderate): Secondary | ICD-10-CM | POA: Diagnosis not present

## 2017-12-01 DIAGNOSIS — N4400 Torsion of testis, unspecified: Secondary | ICD-10-CM | POA: Diagnosis not present

## 2017-12-01 DIAGNOSIS — Z7982 Long term (current) use of aspirin: Secondary | ICD-10-CM | POA: Diagnosis not present

## 2017-12-01 DIAGNOSIS — I482 Chronic atrial fibrillation: Secondary | ICD-10-CM | POA: Diagnosis not present

## 2017-12-01 DIAGNOSIS — I5033 Acute on chronic diastolic (congestive) heart failure: Secondary | ICD-10-CM | POA: Diagnosis not present

## 2017-12-01 DIAGNOSIS — Z9181 History of falling: Secondary | ICD-10-CM | POA: Diagnosis not present

## 2017-12-03 DIAGNOSIS — I130 Hypertensive heart and chronic kidney disease with heart failure and stage 1 through stage 4 chronic kidney disease, or unspecified chronic kidney disease: Secondary | ICD-10-CM | POA: Diagnosis not present

## 2017-12-03 DIAGNOSIS — Z7982 Long term (current) use of aspirin: Secondary | ICD-10-CM | POA: Diagnosis not present

## 2017-12-03 DIAGNOSIS — D631 Anemia in chronic kidney disease: Secondary | ICD-10-CM | POA: Diagnosis not present

## 2017-12-03 DIAGNOSIS — Z9181 History of falling: Secondary | ICD-10-CM | POA: Diagnosis not present

## 2017-12-03 DIAGNOSIS — N4400 Torsion of testis, unspecified: Secondary | ICD-10-CM | POA: Diagnosis not present

## 2017-12-03 DIAGNOSIS — G20 Parkinson's disease: Secondary | ICD-10-CM | POA: Diagnosis not present

## 2017-12-03 DIAGNOSIS — N183 Chronic kidney disease, stage 3 (moderate): Secondary | ICD-10-CM | POA: Diagnosis not present

## 2017-12-03 DIAGNOSIS — I5033 Acute on chronic diastolic (congestive) heart failure: Secondary | ICD-10-CM | POA: Diagnosis not present

## 2017-12-03 DIAGNOSIS — I482 Chronic atrial fibrillation: Secondary | ICD-10-CM | POA: Diagnosis not present

## 2017-12-04 DIAGNOSIS — Z9181 History of falling: Secondary | ICD-10-CM | POA: Diagnosis not present

## 2017-12-04 DIAGNOSIS — I130 Hypertensive heart and chronic kidney disease with heart failure and stage 1 through stage 4 chronic kidney disease, or unspecified chronic kidney disease: Secondary | ICD-10-CM | POA: Diagnosis not present

## 2017-12-04 DIAGNOSIS — I5033 Acute on chronic diastolic (congestive) heart failure: Secondary | ICD-10-CM | POA: Diagnosis not present

## 2017-12-04 DIAGNOSIS — G20 Parkinson's disease: Secondary | ICD-10-CM | POA: Diagnosis not present

## 2017-12-04 DIAGNOSIS — I482 Chronic atrial fibrillation: Secondary | ICD-10-CM | POA: Diagnosis not present

## 2017-12-04 DIAGNOSIS — N4400 Torsion of testis, unspecified: Secondary | ICD-10-CM | POA: Diagnosis not present

## 2017-12-04 DIAGNOSIS — D631 Anemia in chronic kidney disease: Secondary | ICD-10-CM | POA: Diagnosis not present

## 2017-12-04 DIAGNOSIS — N183 Chronic kidney disease, stage 3 (moderate): Secondary | ICD-10-CM | POA: Diagnosis not present

## 2017-12-04 DIAGNOSIS — Z7982 Long term (current) use of aspirin: Secondary | ICD-10-CM | POA: Diagnosis not present

## 2017-12-08 DIAGNOSIS — Z9181 History of falling: Secondary | ICD-10-CM | POA: Diagnosis not present

## 2017-12-08 DIAGNOSIS — N183 Chronic kidney disease, stage 3 (moderate): Secondary | ICD-10-CM | POA: Diagnosis not present

## 2017-12-08 DIAGNOSIS — I130 Hypertensive heart and chronic kidney disease with heart failure and stage 1 through stage 4 chronic kidney disease, or unspecified chronic kidney disease: Secondary | ICD-10-CM | POA: Diagnosis not present

## 2017-12-08 DIAGNOSIS — I5033 Acute on chronic diastolic (congestive) heart failure: Secondary | ICD-10-CM | POA: Diagnosis not present

## 2017-12-08 DIAGNOSIS — G20 Parkinson's disease: Secondary | ICD-10-CM | POA: Diagnosis not present

## 2017-12-08 DIAGNOSIS — I482 Chronic atrial fibrillation: Secondary | ICD-10-CM | POA: Diagnosis not present

## 2017-12-08 DIAGNOSIS — N4400 Torsion of testis, unspecified: Secondary | ICD-10-CM | POA: Diagnosis not present

## 2017-12-08 DIAGNOSIS — D631 Anemia in chronic kidney disease: Secondary | ICD-10-CM | POA: Diagnosis not present

## 2017-12-08 DIAGNOSIS — Z7982 Long term (current) use of aspirin: Secondary | ICD-10-CM | POA: Diagnosis not present

## 2017-12-10 DIAGNOSIS — I130 Hypertensive heart and chronic kidney disease with heart failure and stage 1 through stage 4 chronic kidney disease, or unspecified chronic kidney disease: Secondary | ICD-10-CM | POA: Diagnosis not present

## 2017-12-10 DIAGNOSIS — G20 Parkinson's disease: Secondary | ICD-10-CM | POA: Diagnosis not present

## 2017-12-10 DIAGNOSIS — I5033 Acute on chronic diastolic (congestive) heart failure: Secondary | ICD-10-CM | POA: Diagnosis not present

## 2017-12-10 DIAGNOSIS — N183 Chronic kidney disease, stage 3 (moderate): Secondary | ICD-10-CM | POA: Diagnosis not present

## 2017-12-10 DIAGNOSIS — I482 Chronic atrial fibrillation: Secondary | ICD-10-CM | POA: Diagnosis not present

## 2017-12-10 DIAGNOSIS — N4400 Torsion of testis, unspecified: Secondary | ICD-10-CM | POA: Diagnosis not present

## 2017-12-10 DIAGNOSIS — Z7982 Long term (current) use of aspirin: Secondary | ICD-10-CM | POA: Diagnosis not present

## 2017-12-10 DIAGNOSIS — D631 Anemia in chronic kidney disease: Secondary | ICD-10-CM | POA: Diagnosis not present

## 2017-12-10 DIAGNOSIS — Z9181 History of falling: Secondary | ICD-10-CM | POA: Diagnosis not present

## 2017-12-11 DIAGNOSIS — I482 Chronic atrial fibrillation: Secondary | ICD-10-CM | POA: Diagnosis not present

## 2017-12-11 DIAGNOSIS — G20 Parkinson's disease: Secondary | ICD-10-CM | POA: Diagnosis not present

## 2017-12-11 DIAGNOSIS — D631 Anemia in chronic kidney disease: Secondary | ICD-10-CM | POA: Diagnosis not present

## 2017-12-11 DIAGNOSIS — Z9181 History of falling: Secondary | ICD-10-CM | POA: Diagnosis not present

## 2017-12-11 DIAGNOSIS — Z7982 Long term (current) use of aspirin: Secondary | ICD-10-CM | POA: Diagnosis not present

## 2017-12-11 DIAGNOSIS — I5033 Acute on chronic diastolic (congestive) heart failure: Secondary | ICD-10-CM | POA: Diagnosis not present

## 2017-12-11 DIAGNOSIS — I130 Hypertensive heart and chronic kidney disease with heart failure and stage 1 through stage 4 chronic kidney disease, or unspecified chronic kidney disease: Secondary | ICD-10-CM | POA: Diagnosis not present

## 2017-12-11 DIAGNOSIS — N4400 Torsion of testis, unspecified: Secondary | ICD-10-CM | POA: Diagnosis not present

## 2017-12-11 DIAGNOSIS — N183 Chronic kidney disease, stage 3 (moderate): Secondary | ICD-10-CM | POA: Diagnosis not present

## 2017-12-12 DIAGNOSIS — I130 Hypertensive heart and chronic kidney disease with heart failure and stage 1 through stage 4 chronic kidney disease, or unspecified chronic kidney disease: Secondary | ICD-10-CM | POA: Diagnosis not present

## 2017-12-12 DIAGNOSIS — N4400 Torsion of testis, unspecified: Secondary | ICD-10-CM | POA: Diagnosis not present

## 2017-12-12 DIAGNOSIS — N183 Chronic kidney disease, stage 3 (moderate): Secondary | ICD-10-CM | POA: Diagnosis not present

## 2017-12-12 DIAGNOSIS — I5033 Acute on chronic diastolic (congestive) heart failure: Secondary | ICD-10-CM | POA: Diagnosis not present

## 2017-12-12 DIAGNOSIS — Z9181 History of falling: Secondary | ICD-10-CM | POA: Diagnosis not present

## 2017-12-12 DIAGNOSIS — D631 Anemia in chronic kidney disease: Secondary | ICD-10-CM | POA: Diagnosis not present

## 2017-12-12 DIAGNOSIS — G20 Parkinson's disease: Secondary | ICD-10-CM | POA: Diagnosis not present

## 2017-12-12 DIAGNOSIS — Z7982 Long term (current) use of aspirin: Secondary | ICD-10-CM | POA: Diagnosis not present

## 2017-12-12 DIAGNOSIS — I482 Chronic atrial fibrillation: Secondary | ICD-10-CM | POA: Diagnosis not present

## 2017-12-15 DIAGNOSIS — D631 Anemia in chronic kidney disease: Secondary | ICD-10-CM | POA: Diagnosis not present

## 2017-12-15 DIAGNOSIS — G20 Parkinson's disease: Secondary | ICD-10-CM | POA: Diagnosis not present

## 2017-12-15 DIAGNOSIS — I5033 Acute on chronic diastolic (congestive) heart failure: Secondary | ICD-10-CM | POA: Diagnosis not present

## 2017-12-15 DIAGNOSIS — I482 Chronic atrial fibrillation: Secondary | ICD-10-CM | POA: Diagnosis not present

## 2017-12-15 DIAGNOSIS — I130 Hypertensive heart and chronic kidney disease with heart failure and stage 1 through stage 4 chronic kidney disease, or unspecified chronic kidney disease: Secondary | ICD-10-CM | POA: Diagnosis not present

## 2017-12-15 DIAGNOSIS — N183 Chronic kidney disease, stage 3 (moderate): Secondary | ICD-10-CM | POA: Diagnosis not present

## 2017-12-15 DIAGNOSIS — Z7982 Long term (current) use of aspirin: Secondary | ICD-10-CM | POA: Diagnosis not present

## 2017-12-15 DIAGNOSIS — N4400 Torsion of testis, unspecified: Secondary | ICD-10-CM | POA: Diagnosis not present

## 2017-12-15 DIAGNOSIS — Z9181 History of falling: Secondary | ICD-10-CM | POA: Diagnosis not present

## 2017-12-18 DIAGNOSIS — G20 Parkinson's disease: Secondary | ICD-10-CM | POA: Diagnosis not present

## 2017-12-18 DIAGNOSIS — R2689 Other abnormalities of gait and mobility: Secondary | ICD-10-CM | POA: Diagnosis not present

## 2017-12-18 DIAGNOSIS — M6281 Muscle weakness (generalized): Secondary | ICD-10-CM | POA: Diagnosis not present

## 2017-12-18 DIAGNOSIS — I5033 Acute on chronic diastolic (congestive) heart failure: Secondary | ICD-10-CM | POA: Diagnosis not present

## 2017-12-19 DIAGNOSIS — M6281 Muscle weakness (generalized): Secondary | ICD-10-CM | POA: Diagnosis not present

## 2017-12-19 DIAGNOSIS — G20 Parkinson's disease: Secondary | ICD-10-CM | POA: Diagnosis not present

## 2017-12-19 DIAGNOSIS — R2689 Other abnormalities of gait and mobility: Secondary | ICD-10-CM | POA: Diagnosis not present

## 2017-12-19 DIAGNOSIS — I5033 Acute on chronic diastolic (congestive) heart failure: Secondary | ICD-10-CM | POA: Diagnosis not present

## 2017-12-20 DIAGNOSIS — R2689 Other abnormalities of gait and mobility: Secondary | ICD-10-CM | POA: Diagnosis not present

## 2017-12-20 DIAGNOSIS — G20 Parkinson's disease: Secondary | ICD-10-CM | POA: Diagnosis not present

## 2017-12-20 DIAGNOSIS — I5033 Acute on chronic diastolic (congestive) heart failure: Secondary | ICD-10-CM | POA: Diagnosis not present

## 2017-12-20 DIAGNOSIS — M6281 Muscle weakness (generalized): Secondary | ICD-10-CM | POA: Diagnosis not present

## 2017-12-21 DIAGNOSIS — M6281 Muscle weakness (generalized): Secondary | ICD-10-CM | POA: Diagnosis not present

## 2017-12-21 DIAGNOSIS — G20 Parkinson's disease: Secondary | ICD-10-CM | POA: Diagnosis not present

## 2017-12-21 DIAGNOSIS — R2689 Other abnormalities of gait and mobility: Secondary | ICD-10-CM | POA: Diagnosis not present

## 2017-12-21 DIAGNOSIS — I5033 Acute on chronic diastolic (congestive) heart failure: Secondary | ICD-10-CM | POA: Diagnosis not present

## 2017-12-22 DIAGNOSIS — M6281 Muscle weakness (generalized): Secondary | ICD-10-CM | POA: Diagnosis not present

## 2017-12-22 DIAGNOSIS — R2689 Other abnormalities of gait and mobility: Secondary | ICD-10-CM | POA: Diagnosis not present

## 2017-12-22 DIAGNOSIS — G20 Parkinson's disease: Secondary | ICD-10-CM | POA: Diagnosis not present

## 2017-12-22 DIAGNOSIS — I5033 Acute on chronic diastolic (congestive) heart failure: Secondary | ICD-10-CM | POA: Diagnosis not present

## 2017-12-23 DIAGNOSIS — G20 Parkinson's disease: Secondary | ICD-10-CM | POA: Diagnosis not present

## 2017-12-23 DIAGNOSIS — R531 Weakness: Secondary | ICD-10-CM | POA: Diagnosis not present

## 2017-12-23 DIAGNOSIS — I10 Essential (primary) hypertension: Secondary | ICD-10-CM | POA: Diagnosis not present

## 2017-12-23 DIAGNOSIS — I480 Paroxysmal atrial fibrillation: Secondary | ICD-10-CM | POA: Diagnosis not present

## 2017-12-24 DIAGNOSIS — G20 Parkinson's disease: Secondary | ICD-10-CM | POA: Diagnosis not present

## 2017-12-24 DIAGNOSIS — R2689 Other abnormalities of gait and mobility: Secondary | ICD-10-CM | POA: Diagnosis not present

## 2017-12-24 DIAGNOSIS — M6281 Muscle weakness (generalized): Secondary | ICD-10-CM | POA: Diagnosis not present

## 2017-12-24 DIAGNOSIS — I5033 Acute on chronic diastolic (congestive) heart failure: Secondary | ICD-10-CM | POA: Diagnosis not present

## 2017-12-25 DIAGNOSIS — R2689 Other abnormalities of gait and mobility: Secondary | ICD-10-CM | POA: Diagnosis not present

## 2017-12-25 DIAGNOSIS — G20 Parkinson's disease: Secondary | ICD-10-CM | POA: Diagnosis not present

## 2017-12-25 DIAGNOSIS — I5033 Acute on chronic diastolic (congestive) heart failure: Secondary | ICD-10-CM | POA: Diagnosis not present

## 2017-12-25 DIAGNOSIS — M6281 Muscle weakness (generalized): Secondary | ICD-10-CM | POA: Diagnosis not present

## 2017-12-26 DIAGNOSIS — G20 Parkinson's disease: Secondary | ICD-10-CM | POA: Diagnosis not present

## 2017-12-26 DIAGNOSIS — M6281 Muscle weakness (generalized): Secondary | ICD-10-CM | POA: Diagnosis not present

## 2017-12-26 DIAGNOSIS — I5033 Acute on chronic diastolic (congestive) heart failure: Secondary | ICD-10-CM | POA: Diagnosis not present

## 2017-12-26 DIAGNOSIS — R2689 Other abnormalities of gait and mobility: Secondary | ICD-10-CM | POA: Diagnosis not present

## 2017-12-29 DIAGNOSIS — R2689 Other abnormalities of gait and mobility: Secondary | ICD-10-CM | POA: Diagnosis not present

## 2017-12-29 DIAGNOSIS — M6281 Muscle weakness (generalized): Secondary | ICD-10-CM | POA: Diagnosis not present

## 2017-12-29 DIAGNOSIS — G20 Parkinson's disease: Secondary | ICD-10-CM | POA: Diagnosis not present

## 2017-12-29 DIAGNOSIS — I5033 Acute on chronic diastolic (congestive) heart failure: Secondary | ICD-10-CM | POA: Diagnosis not present

## 2017-12-30 DIAGNOSIS — I5033 Acute on chronic diastolic (congestive) heart failure: Secondary | ICD-10-CM | POA: Diagnosis not present

## 2017-12-30 DIAGNOSIS — G20 Parkinson's disease: Secondary | ICD-10-CM | POA: Diagnosis not present

## 2017-12-30 DIAGNOSIS — M6281 Muscle weakness (generalized): Secondary | ICD-10-CM | POA: Diagnosis not present

## 2017-12-30 DIAGNOSIS — R2689 Other abnormalities of gait and mobility: Secondary | ICD-10-CM | POA: Diagnosis not present

## 2017-12-31 DIAGNOSIS — G20 Parkinson's disease: Secondary | ICD-10-CM | POA: Diagnosis not present

## 2017-12-31 DIAGNOSIS — I5033 Acute on chronic diastolic (congestive) heart failure: Secondary | ICD-10-CM | POA: Diagnosis not present

## 2017-12-31 DIAGNOSIS — M6281 Muscle weakness (generalized): Secondary | ICD-10-CM | POA: Diagnosis not present

## 2017-12-31 DIAGNOSIS — R2689 Other abnormalities of gait and mobility: Secondary | ICD-10-CM | POA: Diagnosis not present

## 2018-02-13 DIAGNOSIS — I739 Peripheral vascular disease, unspecified: Secondary | ICD-10-CM | POA: Diagnosis not present

## 2018-02-13 DIAGNOSIS — B351 Tinea unguium: Secondary | ICD-10-CM | POA: Diagnosis not present

## 2018-02-25 DIAGNOSIS — I5033 Acute on chronic diastolic (congestive) heart failure: Secondary | ICD-10-CM | POA: Diagnosis not present

## 2018-02-25 DIAGNOSIS — I480 Paroxysmal atrial fibrillation: Secondary | ICD-10-CM | POA: Diagnosis not present

## 2018-02-25 DIAGNOSIS — K219 Gastro-esophageal reflux disease without esophagitis: Secondary | ICD-10-CM | POA: Diagnosis not present

## 2018-02-25 DIAGNOSIS — N183 Chronic kidney disease, stage 3 (moderate): Secondary | ICD-10-CM | POA: Diagnosis not present

## 2018-02-25 DIAGNOSIS — M6281 Muscle weakness (generalized): Secondary | ICD-10-CM | POA: Diagnosis not present

## 2018-02-27 DIAGNOSIS — D649 Anemia, unspecified: Secondary | ICD-10-CM | POA: Diagnosis not present

## 2018-02-27 DIAGNOSIS — E559 Vitamin D deficiency, unspecified: Secondary | ICD-10-CM | POA: Diagnosis not present

## 2018-02-27 DIAGNOSIS — E119 Type 2 diabetes mellitus without complications: Secondary | ICD-10-CM | POA: Diagnosis not present

## 2018-02-27 DIAGNOSIS — E039 Hypothyroidism, unspecified: Secondary | ICD-10-CM | POA: Diagnosis not present

## 2018-02-27 DIAGNOSIS — E785 Hyperlipidemia, unspecified: Secondary | ICD-10-CM | POA: Diagnosis not present

## 2018-03-12 DIAGNOSIS — H04123 Dry eye syndrome of bilateral lacrimal glands: Secondary | ICD-10-CM | POA: Diagnosis not present

## 2018-03-12 DIAGNOSIS — Z961 Presence of intraocular lens: Secondary | ICD-10-CM | POA: Diagnosis not present

## 2018-04-17 DIAGNOSIS — M6281 Muscle weakness (generalized): Secondary | ICD-10-CM | POA: Diagnosis not present

## 2018-04-17 DIAGNOSIS — I5033 Acute on chronic diastolic (congestive) heart failure: Secondary | ICD-10-CM | POA: Diagnosis not present

## 2018-04-17 DIAGNOSIS — G20 Parkinson's disease: Secondary | ICD-10-CM | POA: Diagnosis not present

## 2018-04-17 DIAGNOSIS — I480 Paroxysmal atrial fibrillation: Secondary | ICD-10-CM | POA: Diagnosis not present

## 2018-04-27 DIAGNOSIS — B351 Tinea unguium: Secondary | ICD-10-CM | POA: Diagnosis not present

## 2018-04-27 DIAGNOSIS — I739 Peripheral vascular disease, unspecified: Secondary | ICD-10-CM | POA: Diagnosis not present

## 2018-04-27 DIAGNOSIS — R262 Difficulty in walking, not elsewhere classified: Secondary | ICD-10-CM | POA: Diagnosis not present

## 2018-06-11 DIAGNOSIS — N390 Urinary tract infection, site not specified: Secondary | ICD-10-CM | POA: Diagnosis not present

## 2018-06-11 DIAGNOSIS — M6281 Muscle weakness (generalized): Secondary | ICD-10-CM | POA: Diagnosis not present

## 2018-06-11 DIAGNOSIS — G20 Parkinson's disease: Secondary | ICD-10-CM | POA: Diagnosis not present

## 2018-06-12 DIAGNOSIS — R799 Abnormal finding of blood chemistry, unspecified: Secondary | ICD-10-CM | POA: Diagnosis not present

## 2018-06-12 DIAGNOSIS — Z79899 Other long term (current) drug therapy: Secondary | ICD-10-CM | POA: Diagnosis not present

## 2018-06-12 DIAGNOSIS — D649 Anemia, unspecified: Secondary | ICD-10-CM | POA: Diagnosis not present

## 2018-06-12 DIAGNOSIS — Z139 Encounter for screening, unspecified: Secondary | ICD-10-CM | POA: Diagnosis not present

## 2018-06-17 DIAGNOSIS — M6281 Muscle weakness (generalized): Secondary | ICD-10-CM | POA: Diagnosis not present

## 2018-06-17 DIAGNOSIS — G20 Parkinson's disease: Secondary | ICD-10-CM | POA: Diagnosis not present

## 2018-06-23 DIAGNOSIS — I480 Paroxysmal atrial fibrillation: Secondary | ICD-10-CM | POA: Diagnosis not present

## 2018-06-23 DIAGNOSIS — K219 Gastro-esophageal reflux disease without esophagitis: Secondary | ICD-10-CM | POA: Diagnosis not present

## 2018-06-23 DIAGNOSIS — M6281 Muscle weakness (generalized): Secondary | ICD-10-CM | POA: Diagnosis not present

## 2018-06-23 DIAGNOSIS — G20 Parkinson's disease: Secondary | ICD-10-CM | POA: Diagnosis not present

## 2018-06-23 DIAGNOSIS — I5033 Acute on chronic diastolic (congestive) heart failure: Secondary | ICD-10-CM | POA: Diagnosis not present

## 2018-06-26 DIAGNOSIS — D649 Anemia, unspecified: Secondary | ICD-10-CM | POA: Diagnosis not present

## 2018-06-26 DIAGNOSIS — R799 Abnormal finding of blood chemistry, unspecified: Secondary | ICD-10-CM | POA: Diagnosis not present

## 2018-06-26 DIAGNOSIS — Z79899 Other long term (current) drug therapy: Secondary | ICD-10-CM | POA: Diagnosis not present

## 2018-06-26 DIAGNOSIS — E785 Hyperlipidemia, unspecified: Secondary | ICD-10-CM | POA: Diagnosis not present

## 2018-06-26 DIAGNOSIS — E559 Vitamin D deficiency, unspecified: Secondary | ICD-10-CM | POA: Diagnosis not present

## 2018-06-30 DIAGNOSIS — G20 Parkinson's disease: Secondary | ICD-10-CM | POA: Diagnosis not present

## 2018-06-30 DIAGNOSIS — N183 Chronic kidney disease, stage 3 (moderate): Secondary | ICD-10-CM | POA: Diagnosis not present

## 2018-06-30 DIAGNOSIS — M6281 Muscle weakness (generalized): Secondary | ICD-10-CM | POA: Diagnosis not present

## 2018-06-30 DIAGNOSIS — D649 Anemia, unspecified: Secondary | ICD-10-CM | POA: Diagnosis not present

## 2018-07-07 DIAGNOSIS — I5033 Acute on chronic diastolic (congestive) heart failure: Secondary | ICD-10-CM | POA: Diagnosis not present

## 2018-07-07 DIAGNOSIS — R2689 Other abnormalities of gait and mobility: Secondary | ICD-10-CM | POA: Diagnosis not present

## 2018-07-07 DIAGNOSIS — G20 Parkinson's disease: Secondary | ICD-10-CM | POA: Diagnosis not present

## 2018-07-08 DIAGNOSIS — R2689 Other abnormalities of gait and mobility: Secondary | ICD-10-CM | POA: Diagnosis not present

## 2018-07-08 DIAGNOSIS — I5033 Acute on chronic diastolic (congestive) heart failure: Secondary | ICD-10-CM | POA: Diagnosis not present

## 2018-07-08 DIAGNOSIS — G20 Parkinson's disease: Secondary | ICD-10-CM | POA: Diagnosis not present

## 2018-07-09 DIAGNOSIS — R2689 Other abnormalities of gait and mobility: Secondary | ICD-10-CM | POA: Diagnosis not present

## 2018-07-09 DIAGNOSIS — G20 Parkinson's disease: Secondary | ICD-10-CM | POA: Diagnosis not present

## 2018-07-09 DIAGNOSIS — I5033 Acute on chronic diastolic (congestive) heart failure: Secondary | ICD-10-CM | POA: Diagnosis not present

## 2018-07-10 DIAGNOSIS — R2689 Other abnormalities of gait and mobility: Secondary | ICD-10-CM | POA: Diagnosis not present

## 2018-07-10 DIAGNOSIS — I5033 Acute on chronic diastolic (congestive) heart failure: Secondary | ICD-10-CM | POA: Diagnosis not present

## 2018-07-10 DIAGNOSIS — G20 Parkinson's disease: Secondary | ICD-10-CM | POA: Diagnosis not present

## 2018-07-13 DIAGNOSIS — G20 Parkinson's disease: Secondary | ICD-10-CM | POA: Diagnosis not present

## 2018-07-13 DIAGNOSIS — R1084 Generalized abdominal pain: Secondary | ICD-10-CM | POA: Diagnosis not present

## 2018-07-13 DIAGNOSIS — M6281 Muscle weakness (generalized): Secondary | ICD-10-CM | POA: Diagnosis not present

## 2018-07-13 DIAGNOSIS — R2689 Other abnormalities of gait and mobility: Secondary | ICD-10-CM | POA: Diagnosis not present

## 2018-07-13 DIAGNOSIS — R197 Diarrhea, unspecified: Secondary | ICD-10-CM | POA: Diagnosis not present

## 2018-07-13 DIAGNOSIS — I5033 Acute on chronic diastolic (congestive) heart failure: Secondary | ICD-10-CM | POA: Diagnosis not present

## 2018-07-14 DIAGNOSIS — R2689 Other abnormalities of gait and mobility: Secondary | ICD-10-CM | POA: Diagnosis not present

## 2018-07-14 DIAGNOSIS — I5033 Acute on chronic diastolic (congestive) heart failure: Secondary | ICD-10-CM | POA: Diagnosis not present

## 2018-07-14 DIAGNOSIS — G20 Parkinson's disease: Secondary | ICD-10-CM | POA: Diagnosis not present

## 2018-07-15 DIAGNOSIS — I5033 Acute on chronic diastolic (congestive) heart failure: Secondary | ICD-10-CM | POA: Diagnosis not present

## 2018-07-15 DIAGNOSIS — G20 Parkinson's disease: Secondary | ICD-10-CM | POA: Diagnosis not present

## 2018-07-15 DIAGNOSIS — R2689 Other abnormalities of gait and mobility: Secondary | ICD-10-CM | POA: Diagnosis not present

## 2018-07-16 DIAGNOSIS — I5033 Acute on chronic diastolic (congestive) heart failure: Secondary | ICD-10-CM | POA: Diagnosis not present

## 2018-07-16 DIAGNOSIS — G20 Parkinson's disease: Secondary | ICD-10-CM | POA: Diagnosis not present

## 2018-07-16 DIAGNOSIS — R2689 Other abnormalities of gait and mobility: Secondary | ICD-10-CM | POA: Diagnosis not present

## 2018-07-17 DIAGNOSIS — R2689 Other abnormalities of gait and mobility: Secondary | ICD-10-CM | POA: Diagnosis not present

## 2018-07-17 DIAGNOSIS — G20 Parkinson's disease: Secondary | ICD-10-CM | POA: Diagnosis not present

## 2018-07-17 DIAGNOSIS — I5033 Acute on chronic diastolic (congestive) heart failure: Secondary | ICD-10-CM | POA: Diagnosis not present

## 2018-07-20 DIAGNOSIS — G20 Parkinson's disease: Secondary | ICD-10-CM | POA: Diagnosis not present

## 2018-07-20 DIAGNOSIS — R2689 Other abnormalities of gait and mobility: Secondary | ICD-10-CM | POA: Diagnosis not present

## 2018-07-20 DIAGNOSIS — I5033 Acute on chronic diastolic (congestive) heart failure: Secondary | ICD-10-CM | POA: Diagnosis not present

## 2018-09-29 DIAGNOSIS — K219 Gastro-esophageal reflux disease without esophagitis: Secondary | ICD-10-CM | POA: Diagnosis not present

## 2018-09-29 DIAGNOSIS — I480 Paroxysmal atrial fibrillation: Secondary | ICD-10-CM | POA: Diagnosis not present

## 2018-09-29 DIAGNOSIS — G20 Parkinson's disease: Secondary | ICD-10-CM | POA: Diagnosis not present

## 2018-09-29 DIAGNOSIS — I5033 Acute on chronic diastolic (congestive) heart failure: Secondary | ICD-10-CM | POA: Diagnosis not present

## 2018-09-29 DIAGNOSIS — M6281 Muscle weakness (generalized): Secondary | ICD-10-CM | POA: Diagnosis not present

## 2018-09-30 DIAGNOSIS — R799 Abnormal finding of blood chemistry, unspecified: Secondary | ICD-10-CM | POA: Diagnosis not present

## 2018-09-30 DIAGNOSIS — Z139 Encounter for screening, unspecified: Secondary | ICD-10-CM | POA: Diagnosis not present

## 2018-09-30 DIAGNOSIS — Z79899 Other long term (current) drug therapy: Secondary | ICD-10-CM | POA: Diagnosis not present

## 2018-09-30 DIAGNOSIS — D649 Anemia, unspecified: Secondary | ICD-10-CM | POA: Diagnosis not present

## 2018-10-07 DIAGNOSIS — B351 Tinea unguium: Secondary | ICD-10-CM | POA: Diagnosis not present

## 2018-10-07 DIAGNOSIS — L84 Corns and callosities: Secondary | ICD-10-CM | POA: Diagnosis not present

## 2018-10-07 DIAGNOSIS — I739 Peripheral vascular disease, unspecified: Secondary | ICD-10-CM | POA: Diagnosis not present

## 2018-10-09 DIAGNOSIS — G20 Parkinson's disease: Secondary | ICD-10-CM | POA: Diagnosis not present

## 2018-10-29 DIAGNOSIS — G20 Parkinson's disease: Secondary | ICD-10-CM | POA: Diagnosis not present

## 2018-10-30 DIAGNOSIS — G20 Parkinson's disease: Secondary | ICD-10-CM | POA: Diagnosis not present

## 2018-10-30 DIAGNOSIS — G4701 Insomnia due to medical condition: Secondary | ICD-10-CM | POA: Diagnosis not present

## 2018-11-13 DIAGNOSIS — G20 Parkinson's disease: Secondary | ICD-10-CM | POA: Diagnosis not present

## 2018-11-13 DIAGNOSIS — G4701 Insomnia due to medical condition: Secondary | ICD-10-CM | POA: Diagnosis not present

## 2018-11-20 DIAGNOSIS — G20 Parkinson's disease: Secondary | ICD-10-CM | POA: Diagnosis not present

## 2018-11-20 DIAGNOSIS — M6281 Muscle weakness (generalized): Secondary | ICD-10-CM | POA: Diagnosis not present

## 2018-11-20 DIAGNOSIS — I480 Paroxysmal atrial fibrillation: Secondary | ICD-10-CM | POA: Diagnosis not present

## 2018-11-20 DIAGNOSIS — K219 Gastro-esophageal reflux disease without esophagitis: Secondary | ICD-10-CM | POA: Diagnosis not present

## 2018-11-20 DIAGNOSIS — I5032 Chronic diastolic (congestive) heart failure: Secondary | ICD-10-CM | POA: Diagnosis not present

## 2018-11-29 DIAGNOSIS — G20 Parkinson's disease: Secondary | ICD-10-CM | POA: Diagnosis not present

## 2018-12-04 DIAGNOSIS — G4701 Insomnia due to medical condition: Secondary | ICD-10-CM | POA: Diagnosis not present

## 2018-12-04 DIAGNOSIS — G20 Parkinson's disease: Secondary | ICD-10-CM | POA: Diagnosis not present

## 2018-12-07 DIAGNOSIS — G20 Parkinson's disease: Secondary | ICD-10-CM | POA: Diagnosis not present

## 2018-12-07 DIAGNOSIS — G4701 Insomnia due to medical condition: Secondary | ICD-10-CM | POA: Diagnosis not present

## 2018-12-17 DIAGNOSIS — M6281 Muscle weakness (generalized): Secondary | ICD-10-CM | POA: Diagnosis not present

## 2018-12-17 DIAGNOSIS — G20 Parkinson's disease: Secondary | ICD-10-CM | POA: Diagnosis not present

## 2018-12-17 DIAGNOSIS — I5033 Acute on chronic diastolic (congestive) heart failure: Secondary | ICD-10-CM | POA: Diagnosis not present

## 2018-12-18 DIAGNOSIS — I5033 Acute on chronic diastolic (congestive) heart failure: Secondary | ICD-10-CM | POA: Diagnosis not present

## 2018-12-18 DIAGNOSIS — G20 Parkinson's disease: Secondary | ICD-10-CM | POA: Diagnosis not present

## 2018-12-18 DIAGNOSIS — M6281 Muscle weakness (generalized): Secondary | ICD-10-CM | POA: Diagnosis not present

## 2018-12-21 DIAGNOSIS — M6281 Muscle weakness (generalized): Secondary | ICD-10-CM | POA: Diagnosis not present

## 2018-12-21 DIAGNOSIS — G20 Parkinson's disease: Secondary | ICD-10-CM | POA: Diagnosis not present

## 2018-12-21 DIAGNOSIS — I5033 Acute on chronic diastolic (congestive) heart failure: Secondary | ICD-10-CM | POA: Diagnosis not present

## 2018-12-22 DIAGNOSIS — I5033 Acute on chronic diastolic (congestive) heart failure: Secondary | ICD-10-CM | POA: Diagnosis not present

## 2018-12-22 DIAGNOSIS — G20 Parkinson's disease: Secondary | ICD-10-CM | POA: Diagnosis not present

## 2018-12-22 DIAGNOSIS — M6281 Muscle weakness (generalized): Secondary | ICD-10-CM | POA: Diagnosis not present

## 2018-12-23 DIAGNOSIS — G20 Parkinson's disease: Secondary | ICD-10-CM | POA: Diagnosis not present

## 2018-12-23 DIAGNOSIS — M6281 Muscle weakness (generalized): Secondary | ICD-10-CM | POA: Diagnosis not present

## 2018-12-23 DIAGNOSIS — I5033 Acute on chronic diastolic (congestive) heart failure: Secondary | ICD-10-CM | POA: Diagnosis not present

## 2018-12-24 DIAGNOSIS — G20 Parkinson's disease: Secondary | ICD-10-CM | POA: Diagnosis not present

## 2018-12-24 DIAGNOSIS — M6281 Muscle weakness (generalized): Secondary | ICD-10-CM | POA: Diagnosis not present

## 2018-12-24 DIAGNOSIS — I5033 Acute on chronic diastolic (congestive) heart failure: Secondary | ICD-10-CM | POA: Diagnosis not present

## 2018-12-25 DIAGNOSIS — G20 Parkinson's disease: Secondary | ICD-10-CM | POA: Diagnosis not present

## 2018-12-25 DIAGNOSIS — I5033 Acute on chronic diastolic (congestive) heart failure: Secondary | ICD-10-CM | POA: Diagnosis not present

## 2018-12-25 DIAGNOSIS — M6281 Muscle weakness (generalized): Secondary | ICD-10-CM | POA: Diagnosis not present

## 2018-12-28 DIAGNOSIS — G20 Parkinson's disease: Secondary | ICD-10-CM | POA: Diagnosis not present

## 2018-12-28 DIAGNOSIS — M6281 Muscle weakness (generalized): Secondary | ICD-10-CM | POA: Diagnosis not present

## 2018-12-28 DIAGNOSIS — I5033 Acute on chronic diastolic (congestive) heart failure: Secondary | ICD-10-CM | POA: Diagnosis not present

## 2018-12-29 DIAGNOSIS — M6281 Muscle weakness (generalized): Secondary | ICD-10-CM | POA: Diagnosis not present

## 2018-12-29 DIAGNOSIS — I5033 Acute on chronic diastolic (congestive) heart failure: Secondary | ICD-10-CM | POA: Diagnosis not present

## 2018-12-29 DIAGNOSIS — G20 Parkinson's disease: Secondary | ICD-10-CM | POA: Diagnosis not present

## 2018-12-30 DIAGNOSIS — I5033 Acute on chronic diastolic (congestive) heart failure: Secondary | ICD-10-CM | POA: Diagnosis not present

## 2018-12-30 DIAGNOSIS — M6281 Muscle weakness (generalized): Secondary | ICD-10-CM | POA: Diagnosis not present

## 2018-12-30 DIAGNOSIS — G20 Parkinson's disease: Secondary | ICD-10-CM | POA: Diagnosis not present

## 2019-01-01 DIAGNOSIS — G4701 Insomnia due to medical condition: Secondary | ICD-10-CM | POA: Diagnosis not present

## 2019-01-01 DIAGNOSIS — G20 Parkinson's disease: Secondary | ICD-10-CM | POA: Diagnosis not present

## 2019-01-13 DIAGNOSIS — G20 Parkinson's disease: Secondary | ICD-10-CM | POA: Diagnosis not present

## 2019-01-13 DIAGNOSIS — K219 Gastro-esophageal reflux disease without esophagitis: Secondary | ICD-10-CM | POA: Diagnosis not present

## 2019-01-13 DIAGNOSIS — M6281 Muscle weakness (generalized): Secondary | ICD-10-CM | POA: Diagnosis not present

## 2019-01-13 DIAGNOSIS — I5032 Chronic diastolic (congestive) heart failure: Secondary | ICD-10-CM | POA: Diagnosis not present

## 2019-01-13 DIAGNOSIS — I480 Paroxysmal atrial fibrillation: Secondary | ICD-10-CM | POA: Diagnosis not present

## 2019-01-15 DIAGNOSIS — D649 Anemia, unspecified: Secondary | ICD-10-CM | POA: Diagnosis not present

## 2019-01-15 DIAGNOSIS — E039 Hypothyroidism, unspecified: Secondary | ICD-10-CM | POA: Diagnosis not present

## 2019-01-15 DIAGNOSIS — Z139 Encounter for screening, unspecified: Secondary | ICD-10-CM | POA: Diagnosis not present

## 2019-01-15 DIAGNOSIS — E559 Vitamin D deficiency, unspecified: Secondary | ICD-10-CM | POA: Diagnosis not present

## 2019-01-15 DIAGNOSIS — E119 Type 2 diabetes mellitus without complications: Secondary | ICD-10-CM | POA: Diagnosis not present

## 2019-01-19 DIAGNOSIS — B351 Tinea unguium: Secondary | ICD-10-CM | POA: Diagnosis not present

## 2019-01-19 DIAGNOSIS — I739 Peripheral vascular disease, unspecified: Secondary | ICD-10-CM | POA: Diagnosis not present

## 2019-02-04 DIAGNOSIS — R1032 Left lower quadrant pain: Secondary | ICD-10-CM | POA: Diagnosis not present

## 2019-02-04 DIAGNOSIS — K5900 Constipation, unspecified: Secondary | ICD-10-CM | POA: Diagnosis not present

## 2019-02-04 DIAGNOSIS — K219 Gastro-esophageal reflux disease without esophagitis: Secondary | ICD-10-CM | POA: Diagnosis not present

## 2019-02-04 DIAGNOSIS — R1013 Epigastric pain: Secondary | ICD-10-CM | POA: Diagnosis not present

## 2019-02-05 DIAGNOSIS — G4701 Insomnia due to medical condition: Secondary | ICD-10-CM | POA: Diagnosis not present

## 2019-02-05 DIAGNOSIS — G20 Parkinson's disease: Secondary | ICD-10-CM | POA: Diagnosis not present

## 2019-02-05 DIAGNOSIS — K219 Gastro-esophageal reflux disease without esophagitis: Secondary | ICD-10-CM | POA: Diagnosis not present

## 2019-02-05 DIAGNOSIS — K5900 Constipation, unspecified: Secondary | ICD-10-CM | POA: Diagnosis not present

## 2019-02-05 DIAGNOSIS — R1032 Left lower quadrant pain: Secondary | ICD-10-CM | POA: Diagnosis not present

## 2019-02-05 DIAGNOSIS — R1013 Epigastric pain: Secondary | ICD-10-CM | POA: Diagnosis not present

## 2019-02-25 DIAGNOSIS — H04123 Dry eye syndrome of bilateral lacrimal glands: Secondary | ICD-10-CM | POA: Diagnosis not present

## 2019-02-25 DIAGNOSIS — Z961 Presence of intraocular lens: Secondary | ICD-10-CM | POA: Diagnosis not present

## 2019-03-26 DIAGNOSIS — G4701 Insomnia due to medical condition: Secondary | ICD-10-CM | POA: Diagnosis not present

## 2019-04-04 ENCOUNTER — Other Ambulatory Visit: Payer: Self-pay

## 2019-04-04 ENCOUNTER — Inpatient Hospital Stay (HOSPITAL_COMMUNITY)
Admission: EM | Admit: 2019-04-04 | Discharge: 2019-05-02 | DRG: 871 | Disposition: E | Payer: Medicare Other | Source: Skilled Nursing Facility | Attending: Family Medicine | Admitting: Family Medicine

## 2019-04-04 ENCOUNTER — Inpatient Hospital Stay (HOSPITAL_COMMUNITY): Payer: Medicare Other

## 2019-04-04 ENCOUNTER — Emergency Department (HOSPITAL_COMMUNITY): Payer: Medicare Other

## 2019-04-04 ENCOUNTER — Encounter (HOSPITAL_COMMUNITY): Payer: Self-pay | Admitting: Emergency Medicine

## 2019-04-04 DIAGNOSIS — I959 Hypotension, unspecified: Secondary | ICD-10-CM | POA: Diagnosis not present

## 2019-04-04 DIAGNOSIS — Z79899 Other long term (current) drug therapy: Secondary | ICD-10-CM

## 2019-04-04 DIAGNOSIS — E875 Hyperkalemia: Secondary | ICD-10-CM | POA: Diagnosis not present

## 2019-04-04 DIAGNOSIS — F0280 Dementia in other diseases classified elsewhere without behavioral disturbance: Secondary | ICD-10-CM | POA: Diagnosis present

## 2019-04-04 DIAGNOSIS — I480 Paroxysmal atrial fibrillation: Secondary | ICD-10-CM | POA: Diagnosis not present

## 2019-04-04 DIAGNOSIS — R918 Other nonspecific abnormal finding of lung field: Secondary | ICD-10-CM | POA: Diagnosis not present

## 2019-04-04 DIAGNOSIS — I9589 Other hypotension: Secondary | ICD-10-CM

## 2019-04-04 DIAGNOSIS — A419 Sepsis, unspecified organism: Secondary | ICD-10-CM | POA: Diagnosis not present

## 2019-04-04 DIAGNOSIS — E872 Acidosis: Secondary | ICD-10-CM | POA: Diagnosis not present

## 2019-04-04 DIAGNOSIS — R531 Weakness: Secondary | ICD-10-CM | POA: Diagnosis not present

## 2019-04-04 DIAGNOSIS — Z20828 Contact with and (suspected) exposure to other viral communicable diseases: Secondary | ICD-10-CM | POA: Diagnosis present

## 2019-04-04 DIAGNOSIS — F4320 Adjustment disorder, unspecified: Secondary | ICD-10-CM | POA: Diagnosis present

## 2019-04-04 DIAGNOSIS — K56609 Unspecified intestinal obstruction, unspecified as to partial versus complete obstruction: Secondary | ICD-10-CM | POA: Diagnosis present

## 2019-04-04 DIAGNOSIS — Z9181 History of falling: Secondary | ICD-10-CM | POA: Diagnosis not present

## 2019-04-04 DIAGNOSIS — J189 Pneumonia, unspecified organism: Secondary | ICD-10-CM | POA: Diagnosis not present

## 2019-04-04 DIAGNOSIS — I130 Hypertensive heart and chronic kidney disease with heart failure and stage 1 through stage 4 chronic kidney disease, or unspecified chronic kidney disease: Secondary | ICD-10-CM | POA: Diagnosis not present

## 2019-04-04 DIAGNOSIS — F329 Major depressive disorder, single episode, unspecified: Secondary | ICD-10-CM | POA: Diagnosis present

## 2019-04-04 DIAGNOSIS — Z7982 Long term (current) use of aspirin: Secondary | ICD-10-CM

## 2019-04-04 DIAGNOSIS — G20 Parkinson's disease: Secondary | ICD-10-CM | POA: Diagnosis present

## 2019-04-04 DIAGNOSIS — N3000 Acute cystitis without hematuria: Secondary | ICD-10-CM | POA: Diagnosis not present

## 2019-04-04 DIAGNOSIS — R1112 Projectile vomiting: Secondary | ICD-10-CM

## 2019-04-04 DIAGNOSIS — E785 Hyperlipidemia, unspecified: Secondary | ICD-10-CM | POA: Diagnosis present

## 2019-04-04 DIAGNOSIS — I5042 Chronic combined systolic (congestive) and diastolic (congestive) heart failure: Secondary | ICD-10-CM | POA: Diagnosis not present

## 2019-04-04 DIAGNOSIS — R0902 Hypoxemia: Secondary | ICD-10-CM | POA: Diagnosis not present

## 2019-04-04 DIAGNOSIS — E861 Hypovolemia: Secondary | ICD-10-CM

## 2019-04-04 DIAGNOSIS — Z87891 Personal history of nicotine dependence: Secondary | ICD-10-CM

## 2019-04-04 DIAGNOSIS — N179 Acute kidney failure, unspecified: Secondary | ICD-10-CM | POA: Diagnosis not present

## 2019-04-04 DIAGNOSIS — K219 Gastro-esophageal reflux disease without esophagitis: Secondary | ICD-10-CM | POA: Diagnosis present

## 2019-04-04 DIAGNOSIS — N183 Chronic kidney disease, stage 3 unspecified: Secondary | ICD-10-CM | POA: Diagnosis not present

## 2019-04-04 DIAGNOSIS — D631 Anemia in chronic kidney disease: Secondary | ICD-10-CM | POA: Diagnosis present

## 2019-04-04 DIAGNOSIS — E7800 Pure hypercholesterolemia, unspecified: Secondary | ICD-10-CM | POA: Diagnosis present

## 2019-04-04 DIAGNOSIS — N3001 Acute cystitis with hematuria: Secondary | ICD-10-CM | POA: Diagnosis not present

## 2019-04-04 DIAGNOSIS — R630 Anorexia: Secondary | ICD-10-CM | POA: Diagnosis present

## 2019-04-04 DIAGNOSIS — R140 Abdominal distension (gaseous): Secondary | ICD-10-CM

## 2019-04-04 LAB — URINALYSIS, ROUTINE W REFLEX MICROSCOPIC
Bilirubin Urine: NEGATIVE
Glucose, UA: NEGATIVE mg/dL
Ketones, ur: NEGATIVE mg/dL
Nitrite: NEGATIVE
Protein, ur: 100 mg/dL — AB
Specific Gravity, Urine: 1.019 (ref 1.005–1.030)
WBC, UA: >50 WBC/hpf — ABNORMAL HIGH (ref 0–5)
pH: 6 (ref 5.0–8.0)

## 2019-04-04 LAB — TROPONIN I (HIGH SENSITIVITY): Troponin I (High Sensitivity): 32 ng/L — ABNORMAL HIGH (ref <18)

## 2019-04-04 LAB — CBC WITH DIFFERENTIAL/PLATELET
Abs Immature Granulocytes: 0.02 10*3/uL (ref 0.00–0.07)
Basophils Absolute: 0 10*3/uL (ref 0.0–0.1)
Basophils Relative: 0 %
Eosinophils Absolute: 0 10*3/uL (ref 0.0–0.5)
Eosinophils Relative: 0 %
HCT: 34.9 % — ABNORMAL LOW (ref 39.0–52.0)
Hemoglobin: 10.8 g/dL — ABNORMAL LOW (ref 13.0–17.0)
Immature Granulocytes: 0 %
Lymphocytes Relative: 8 %
Lymphs Abs: 0.6 10*3/uL — ABNORMAL LOW (ref 0.7–4.0)
MCH: 28.2 pg (ref 26.0–34.0)
MCHC: 30.9 g/dL (ref 30.0–36.0)
MCV: 91.1 fL (ref 80.0–100.0)
Monocytes Absolute: 0.5 10*3/uL (ref 0.1–1.0)
Monocytes Relative: 7 %
Neutro Abs: 6.3 10*3/uL (ref 1.7–7.7)
Neutrophils Relative %: 85 %
Platelets: 144 10*3/uL — ABNORMAL LOW (ref 150–400)
RBC: 3.83 MIL/uL — ABNORMAL LOW (ref 4.22–5.81)
RDW: 14.1 % (ref 11.5–15.5)
WBC: 7.5 10*3/uL (ref 4.0–10.5)
nRBC: 0 % (ref 0.0–0.2)

## 2019-04-04 LAB — SARS CORONAVIRUS 2 BY RT PCR (HOSPITAL ORDER, PERFORMED IN ~~LOC~~ HOSPITAL LAB): SARS Coronavirus 2: NEGATIVE

## 2019-04-04 LAB — COMPREHENSIVE METABOLIC PANEL
ALT: <5 U/L (ref 0–44)
AST: 12 U/L — ABNORMAL LOW (ref 15–41)
Albumin: 3.7 g/dL (ref 3.5–5.0)
Alkaline Phosphatase: 48 U/L (ref 38–126)
Anion gap: 16 — ABNORMAL HIGH (ref 5–15)
BUN: 126 mg/dL — ABNORMAL HIGH (ref 8–23)
CO2: 23 mmol/L (ref 22–32)
Calcium: 8.5 mg/dL — ABNORMAL LOW (ref 8.9–10.3)
Chloride: 99 mmol/L (ref 98–111)
Creatinine, Ser: 6.21 mg/dL — ABNORMAL HIGH (ref 0.61–1.24)
GFR calc Af Amer: 9 mL/min — ABNORMAL LOW (ref >60)
GFR calc non Af Amer: 7 mL/min — ABNORMAL LOW (ref >60)
Glucose, Bld: 123 mg/dL — ABNORMAL HIGH (ref 70–99)
Potassium: 5.4 mmol/L — ABNORMAL HIGH (ref 3.5–5.1)
Sodium: 138 mmol/L (ref 135–145)
Total Bilirubin: 0.7 mg/dL (ref 0.3–1.2)
Total Protein: 7.4 g/dL (ref 6.5–8.1)

## 2019-04-04 LAB — CBG MONITORING, ED: Glucose-Capillary: 104 mg/dL — ABNORMAL HIGH (ref 70–99)

## 2019-04-04 LAB — TSH: TSH: 1.685 u[IU]/mL (ref 0.350–4.500)

## 2019-04-04 LAB — LACTIC ACID, PLASMA: Lactic Acid, Venous: 2.4 mmol/L (ref 0.5–1.9)

## 2019-04-04 LAB — MAGNESIUM: Magnesium: 2.2 mg/dL (ref 1.7–2.4)

## 2019-04-04 MED ORDER — FOLIC ACID 1 MG PO TABS: 1.0000 mg | ORAL_TABLET | Freq: Every day | ORAL | Status: DC

## 2019-04-04 MED ORDER — SODIUM CHLORIDE 0.9 % IV SOLN
1.0000 g | Freq: Once | INTRAVENOUS | Status: AC
  Administered 2019-04-04: 1 g via INTRAVENOUS
  Filled 2019-04-04: qty 10

## 2019-04-04 MED ORDER — SODIUM CHLORIDE 0.9 % IV BOLUS
500.0000 mL | Freq: Once | INTRAVENOUS | Status: AC
  Administered 2019-04-04: 500 mL via INTRAVENOUS

## 2019-04-04 MED ORDER — DEXTROSE 50 % IV SOLN
50.0000 mL | Freq: Once | INTRAVENOUS | Status: AC
  Administered 2019-04-04: 50 mL via INTRAVENOUS
  Filled 2019-04-04: qty 50

## 2019-04-04 MED ORDER — MIRTAZAPINE 7.5 MG PO TABS
7.5000 mg | ORAL_TABLET | Freq: Every day | ORAL | Status: DC
  Filled 2019-04-04: qty 1

## 2019-04-04 MED ORDER — SODIUM CHLORIDE 0.9 % IV SOLN
1000.0000 mL | INTRAVENOUS | Status: DC
  Administered 2019-04-04: 1000 mL via INTRAVENOUS

## 2019-04-04 MED ORDER — ASPIRIN EC 81 MG PO TBEC: 81.0000 mg | DELAYED_RELEASE_TABLET | Freq: Every day | ORAL | Status: DC

## 2019-04-04 MED ORDER — SODIUM CHLORIDE 0.9 % IV SOLN: INTRAVENOUS | Status: DC

## 2019-04-04 MED ORDER — HYPROMELLOSE 0.3 % OP GEL
1.0000 "application " | Freq: Three times a day (TID) | OPHTHALMIC | Status: DC | PRN
  Filled 2019-04-04: qty 3.5

## 2019-04-04 MED ORDER — ACETAMINOPHEN 325 MG PO TABS: 650.0000 mg | ORAL_TABLET | Freq: Four times a day (QID) | ORAL | Status: DC | PRN

## 2019-04-04 MED ORDER — ACETAMINOPHEN 650 MG RE SUPP: 650.0000 mg | Freq: Four times a day (QID) | RECTAL | Status: DC | PRN

## 2019-04-04 MED ORDER — CALCIUM CARBONATE-VITAMIN D3 600-400 MG-UNIT PO TABS: 2.0000 | ORAL_TABLET | Freq: Every day | ORAL | Status: DC

## 2019-04-04 MED ORDER — SODIUM CHLORIDE 0.9 % IV SOLN
2.0000 g | INTRAVENOUS | Status: DC
  Administered 2019-04-04: 2 g via INTRAVENOUS
  Filled 2019-04-04: qty 20

## 2019-04-04 MED ORDER — VITAMIN D 25 MCG (1000 UNIT) PO TABS: 4000.0000 [IU] | ORAL_TABLET | Freq: Every day | ORAL | Status: DC

## 2019-04-04 MED ORDER — SODIUM CHLORIDE 0.9 % IV SOLN
500.0000 mg | INTRAVENOUS | Status: DC
  Administered 2019-04-04: 18:00:00 500 mg via INTRAVENOUS
  Filled 2019-04-04: qty 500

## 2019-04-04 MED ORDER — POLYETHYLENE GLYCOL 3350 17 G PO PACK: 17.0000 g | PACK | Freq: Every day | ORAL | Status: DC | PRN

## 2019-04-04 MED ORDER — INSULIN ASPART 100 UNIT/ML IV SOLN
5.0000 [IU] | Freq: Once | INTRAVENOUS | Status: AC
  Administered 2019-04-04: 5 [IU] via INTRAVENOUS

## 2019-04-04 MED ORDER — ENOXAPARIN SODIUM 30 MG/0.3ML ~~LOC~~ SOLN
30.0000 mg | SUBCUTANEOUS | Status: DC
  Administered 2019-04-04: 30 mg via SUBCUTANEOUS
  Filled 2019-04-04: qty 0.3

## 2019-04-04 MED ORDER — PANTOPRAZOLE SODIUM 40 MG PO TBEC: 40.0000 mg | DELAYED_RELEASE_TABLET | Freq: Every day | ORAL | Status: DC

## 2019-04-04 MED ORDER — SODIUM CHLORIDE 0.9 % IV BOLUS
1000.0000 mL | Freq: Once | INTRAVENOUS | Status: AC
  Administered 2019-04-04: 1000 mL via INTRAVENOUS

## 2019-04-04 MED ORDER — SODIUM CHLORIDE 0.9 % IV BOLUS
1200.0000 mL | Freq: Once | INTRAVENOUS | Status: AC
  Administered 2019-04-04: 20:00:00 1200 mL via INTRAVENOUS

## 2019-04-04 MED ORDER — CARBIDOPA-LEVODOPA 25-100 MG PO TABS
2.0000 | ORAL_TABLET | Freq: Three times a day (TID) | ORAL | Status: DC
  Filled 2019-04-04: qty 2

## 2019-04-04 NOTE — ED Notes (Signed)
Patient transported to CT 

## 2019-04-04 NOTE — ED Notes (Signed)
Verbal order obtained from Dr. Reather Converse for in and out catheter.

## 2019-04-04 NOTE — ED Notes (Signed)
Condom cath placed on pt. Linens changed.

## 2019-04-04 NOTE — Progress Notes (Signed)
Notified provider and bedside nurse of need to order fluid bolus.  Pt has had 2 maps less than 65 needing full fluid resuscitation. I have secure chatted the bedside RN and the MD to ask if he would consider adding more fluids

## 2019-04-04 NOTE — ED Notes (Signed)
Admitting providers at bedside for evaluation.

## 2019-04-04 NOTE — ED Triage Notes (Signed)
[facility]- pt here for hypotension and weakness. Pt is from [employer]. Pt is A&O x2 at baseline. Pt usually walks with a walker but today has no effort in getting out bed. EMS reports initial blood pressure in the 54D systolic. Most recent systolic BP was 826.

## 2019-04-04 NOTE — H&P (Addendum)
[facility] Admission History and Physical Service Pager: [phone]  Patient name: Kevin Roman Medical record number: [id number] Date of birth: March 20, 1930 Age: 85 y.o. Gender: male  Primary Care Provider: Patient, No Pcp Per Consultants: None Code Status: Full (confirmed w/ facility) Preferred Emergency Contact: Thelma Viana - wife -[phone]  Chief Complaint: Generalized weakness  Assessment and Plan: Kevin Roman is a 85 y.o. male presenting with apparent urosepsis. PMH is significant for Parkinson's disease, A. fib, hypertension, hyperlipidemia, CKD stage III, depression.  Generalized weakness secondary to urosepsis vs pneumonia vs other etiology Patient with 1-2 days of generalized weakness and fatigue noted by his facility.  In the emergency department patient met sepsis criteria with hypotension and tachypnea and lactic acidosis. Possible sources include pneumonia given portable chest x-ray showing "patchy right midlung opacity that is new, suspicious for pneumonia" and coarse breath sounds throughout although patient denies respiratory symptoms, shortness of breath, cough, fever. Placed on 1L [place] in ED for desat to 77%. Additional source could also be UTI. Urinalysis in our ED was significant for turbid appearing urine with large leukocytes, negative nitrite and many bacteria.  However, patient denied urinary symptoms or lower abdominal pain.  It is unclear if this urinalysis is a sign of overall infection or if the patient is simply colonized at this point. He is without leukocytosis. In the emergency department the patient received fluids resuscitation with 1500 cc bolus and an additional 1200 cc bolus of normal saline.  He was noted to have no focal neurological deficits but was confused which may be his baseline. No prior h/o stroke.  Evidence of volume contraction with hypotension, elevated creatinine and troponin in absence of symptoms, s/p 2.7L NS  bolus.  -Admit to med telemetry, attending Dr. Nori Riis  -Head CT to r/o new stroke -Daily CBC/BMP -Continue azithromycin/ceftriaxone started in the ED (10/4 - ) - trend lactic acid -Cardiac monitoring and continuous pulse ox -Normal saline at maintenance -Vital signs per floor - trend trops, elevated likely 2/2 demand - f/u blood, urine cultures - NPO - SLP consult  Hyperkalemia Potassium 5.4 in the ED. -Patient status post calcium gluconate, 5u novolog, and IVF boluses in the ED  - Recheck BMP 8pm -Continue to monitor K+  Anemia Hemoglobin-10.8.  This appears at baseline. -Continue to monitor  Paroxysmal A. fib Rate controlled.  Medications include metoprolol. Patient not on anti-coag. -hold home medication given hypotension  HFrEF Per echocardiogram 09/29/2017 at [facility] patient had ejection fraction of 83-66%, grade 2 diastolic dysfunction.  Patient takes 60 mg Lasix daily at home. Irregular rhythm on exam with ventricular bigeminy on EKG. Denies palpations or chest pain. -Consider repeat echo -Monitor fluid status -hold home medications - daily weights - strict I&Os  AKI on CKD 3 Current BUN of 126, creatinine of 6.21.  Per chart review baseline creatinine appears to be around 1.5.  Patient with BUN/creatinine ratio approximately 20 suggest prerenal. -Continue to replenish patient's fluids, s/p 2.7L NS bolus -Can consider nephrology consult if labs do not improve with fluid resuscitation -Monitor urine output -Monitor patient creatinine with daily BMP  Parkinson's disease Medications include Sinemet IR, Remeron. Complicating history taking. Per chart review, has h/o delirium and agitation at prior hospitalization. -Continue home medications  Hypertension  Patient currently hypotensive with most recent blood pressures 85-116/46-103.  Home medications include clonidine patch. -Hold patient's home medications -Continue to monitor blood pressures  GERD All medications  include Protonix -Continue home medication  Adjustment  disorder/Depression Unsure of the severity or how often the patient is affected by this. -Continue to monitor  H/o incarcerated hernia w/ SBO S/p surgical hernia repair 07/2016.   FEN/GI: Soft diet Prophylaxis: Lovenox  Disposition: Pending medical work-up  History of Present Illness:  Kevin Roman is a 85 y.o. male presenting with generalized weakness and loss of appetite from [facility].  Patient is confused at baseline and the following history was obtained from his facility: Per his facility, starting yesterday he seems more fatigued and weak than usual, though his vitals were stable during this time.  According to his facility he occasionally gets nauseated prior to having a bowel movement and had a period of nausea and vomiting yesterday.  After this he was able to have his bowel movement and was noted to be more tired than normal to nursing staff.  Patient's vital signs remained good during this time.  At his facility did not have any fever, shortness of breath, cough, or trouble breathing at his facility.  His nurse also states that he was able to eat this morning 04/21/2019. Patient is urine incontinent at baseline, however his facility states that he did not have any abnormal odor or anything abnormal with regard to his recent urination.  His facility denies sick contacts.  They state that he does sometimes have trouble with bowel movements but has bowel movements tend to be formed, not hard.  He has not had any change in medications recently. At baseline he can stand and sit on his own, however he does shake due to his Parkinson's disease and requires assistance with ambulation.  He has had falls in the past due to trying to walk on his own.  His facility but denies any recent falls. At baseline is oriented to self and time ("knows night vs day"). Nurse is unsure if he is normally oriented to place.  In the emergency  department the patient received fluids resuscitation with 1500 cc bolus of normal saline.  Patient was maintained on 1L [place] satting 98%.  Blood pressures were soft in the 90s to 110s over 50s to 90s.   Review Of Systems: Per HPI with the following additions: Limited due to confusion  Review of Systems  Unable to perform ROS: Mental acuity  Respiratory: Negative for cough and shortness of breath.   Cardiovascular: Negative for chest pain.  Gastrointestinal: Negative for abdominal pain, nausea and vomiting.  Genitourinary: Negative for dysuria.    Patient Active Problem List   Diagnosis Date Noted  . Generalized weakness 04/01/2019  . CKD (chronic kidney disease), stage III 09/28/2017  . Chronic a-fib ([place]) 09/28/2017  . CHF (congestive heart failure) ([place]) 09/28/2017  . Acute CHF (congestive heart failure) ([place])   . SBO (small bowel obstruction) ([place]) 07/21/2016  . Incarcerated hernia 07/21/2016  . Acute renal failure ([place]) 11/28/2011  . Palpitations 11/26/2011  . HTN (hypertension) 11/26/2011  . Parkinson's disease ([place]) 11/26/2011  . Hypercholesteremia 11/26/2011  . Anemia 11/26/2011    Past Medical History: Past Medical History:  Diagnosis Date  . Hypertension   . Parkinson's disease     Past Surgical History: Past Surgical History:  Procedure Laterality Date  . INGUINAL HERNIA REPAIR Left 07/26/2016   Procedure: HERNIA REPAIR INGUINAL ADULT;  Surgeon: Johnathan Hausen, MD;  Location: [facility] ORS;  Service: General;  Laterality: Left;  . PROSTATE SURGERY      Social History: Social History   Tobacco Use  . Smoking status: Former Research scientist (life sciences)  .  Smokeless tobacco: Never Used  Substance Use Topics  . Alcohol use: No  . Drug use: No   Additional social history: resides at [facility] Please also refer to relevant sections of EMR.  Family History: No family history on file.  Allergies and Medications: No Known Allergies No current facility-administered  medications on file prior to encounter.    Current Outpatient Medications on File Prior to Encounter  Medication Sig Dispense Refill  . alum & mag hydroxide-simeth (MAALOX/MYLANTA) 200-200-20 MG/5ML suspension Take 30 mLs by mouth every 4 (four) hours as needed (upset stomach (do not more than 4 doses in 24 hours)).    Marland Kitchen aspirin EC 81 MG EC tablet Take 1 tablet (81 mg total) by mouth daily. 30 tablet   . Calcium Carbonate-Vitamin D3 (CALCIUM 600/VITAMIN D) 600-400 MG-UNIT TABS Take 2 tablets by mouth daily.    . carbidopa-levodopa (SINEMET IR) 25-100 MG per tablet Take 2 tablets by mouth 3 (three) times daily. 8am, 12pm, 8pm    . Cholecalciferol (VITAMIN D) 50 MCG (2000 UT) tablet Take 4,000 Units by mouth daily.    . cloNIDine (CATAPRES - DOSED IN MG/24 HR) 0.1 mg/24hr patch Place 1 patch (0.1 mg total) onto the skin once a week. (Patient taking differently: Place 0.1 mg onto the skin every Monday. ) 4 patch 12  . folic acid (FOLVITE) 053 MCG tablet Take 800 mcg by mouth daily.     . furosemide (LASIX) 20 MG tablet Take 60 mg by mouth daily.    . hypromellose (GENTEAL) 0.3 % GEL ophthalmic ointment Place 1 application into both eyes every 8 (eight) hours as needed for dry eyes.    Marland Kitchen leuprolide (LUPRON) 22.5 MG injection Inject 22.5 mg into the muscle every 3 (three) months.    . metoprolol (LOPRESSOR) 50 MG tablet Take 50 mg by mouth daily.     . mirtazapine (REMERON) 7.5 MG tablet Take 7.5 mg by mouth at bedtime.    . pantoprazole (PROTONIX) 40 MG tablet Take 40 mg by mouth daily.    . furosemide (LASIX) 40 MG tablet Take 1.5 tablets (60 mg total) by mouth daily. (Patient not taking: Reported on 04/18/2019) 60 tablet 0    Objective: BP 101/65   Pulse (!) 44   Temp 98.9 F (37.2 C) (Oral)   Resp 19   SpO2 98%  Exam: General: Patient alert only to self.  Comfortably on room air.  No apparent distress.  Eyes: PERRLA ENTM: No pharyengeal erythema, edentulous  Cardiovascular: irregular  rhythm with no murmurs noted Respiratory: Coarse breath sounds throughout. Normal WOB. Appropriately saturated on 1L [place]. Gastrointestinal: Bowel sounds present. No abdominal pain, soft, nondistended MSK: Upper extremity strength 5/5 bilaterally, Lower extremity strength 5/5 bilaterally  Derm: No rashes, wounds, or lesions noted Neuro: CN II through XII grossly intact.  Labs and Imaging: CBC BMET  Recent Labs  Lab 05/01/2019 1423  WBC 7.5  HGB 10.8*  HCT 34.9*  PLT 144*   Recent Labs  Lab 04/03/2019 1423  NA 138  K 5.4*  CL 99  CO2 23  BUN 126*  CREATININE 6.21*  GLUCOSE 123*  CALCIUM 8.5*     Ct Head Wo Contrast  Result Date: 04/10/2019 CLINICAL DATA:  85 year old male with generalized weakness. Hypotension. EXAM: CT HEAD WITHOUT CONTRAST TECHNIQUE: Contiguous axial images were obtained from the base of the skull through the vertex without intravenous contrast. COMPARISON:  None. FINDINGS: Brain: Very mild cerebral atrophy. Patchy areas  of mild decreased attenuation are noted throughout the deep and periventricular white matter of the cerebral hemispheres bilaterally, compatible with mild chronic microvascular ischemic disease. No evidence of acute infarction, hemorrhage, hydrocephalus, extra-axial collection or mass lesion/mass effect. Vascular: No hyperdense vessel or unexpected calcification. Skull: Normal. Negative for fracture or focal lesion. Sinuses/Orbits: No acute finding. Other: None. IMPRESSION: 1. No acute intracranial abnormalities. 2. Mild cerebral atrophy with mild chronic microvascular ischemic changes in the cerebral white matter. Electronically Signed   By: Vinnie Langton M.D.   On: 04/18/2019 19:25   Dg Chest Port 1 View  Result Date: 04/30/2019 CLINICAL DATA:  Hypotension.  Weakness. EXAM: PORTABLE CHEST 1 VIEW COMPARISON:  09/29/2017 chest radiograph. FINDINGS: Stable cardiomediastinal silhouette with normal heart size. No pneumothorax. No pleural effusion.  Patchy right mid lung opacities new. Clear left lung. IMPRESSION: Patchy right mid lung opacity is new, suspicious for pneumonia. Chest radiograph follow-up advised. Electronically Signed   By: Ilona Sorrel M.D.   On: 04/02/2019 15:34   Lurline Del, DO 04/03/2019, 7:08 PM PGY-1, [place] Intern pager: [phone], text pages welcome  FPTS Upper-Level Resident Addendum   I have independently interviewed and examined the patient. I have discussed the above with the original author and agree with their documentation. My edits for correction/addition/clarification are in green. Please see also any attending notes.    Rory Percy, DO PGY-3, [facility] 04/24/2019 8:38 PM  [place] Service pager: [phone] (text pages welcome through [facility])

## 2019-04-04 NOTE — ED Provider Notes (Signed)
[facility] Provider Note   CSN: [id number] Arrival date & time: 04/25/2019  1400     History   Chief Complaint No chief complaint on file.   HPI Kevin Roman is a 85 y.o. male.     Patient with history of chronic kidney disease, high blood pressure, Parkinson's, muscle weakness and anemia [facility] low blood pressure and general weakness.  Decreased appetite recently.  Patient usually walks with a walker but today has no general effort to get out of bed.  She will blood pressure for EMS 80s systolic.  Further details unknown as no family member or nursing of staff at bedside.  Patient at baseline alert and oriented x2 and currently denies symptoms except for fatigue.     Past Medical History:  Diagnosis Date  . Hypertension   . Parkinson's disease     Patient Active Problem List   Diagnosis Date Noted  . Generalized weakness 09-Jan-2019  . CKD (chronic kidney disease), stage III 09/28/2017  . Chronic a-fib (HCC) 09/28/2017  . CHF (congestive heart failure) (HCC) 09/28/2017  . Acute CHF (congestive heart failure) (HCC)   . SBO (small bowel obstruction) (HCC) 07/21/2016  . Incarcerated hernia 07/21/2016  . Acute renal failure (HCC) 11/28/2011  . Palpitations 11/26/2011  . HTN (hypertension) 11/26/2011  . Parkinson's disease (HCC) 11/26/2011  . Hypercholesteremia 11/26/2011  . Anemia 11/26/2011    Past Surgical History:  Procedure Laterality Date  . INGUINAL HERNIA REPAIR Left 07/26/2016   Procedure: HERNIA REPAIR INGUINAL ADULT;  Surgeon: Luretha MurphyMatthew Martin, MD;  Location: [facility] ORS;  Service: General;  Laterality: Left;  . PROSTATE SURGERY          Home Medications    Prior to Admission medications   Medication Sig Start Date End Date Taking? Authorizing Provider  alum & mag hydroxide-simeth (MAALOX/MYLANTA) 200-200-20 MG/5ML suspension Take 30 mLs by mouth every 4 (four) hours as needed  (upset stomach (do not more than 4 doses in 24 hours)).   Yes [provider]  aspirin EC 81 MG EC tablet Take 1 tablet (81 mg total) by mouth daily. 10/02/17  Yes Randel PiggSilva Zapata, Dorma RussellEdwin, MD  Calcium Carbonate-Vitamin D3 (CALCIUM 600/VITAMIN D) 600-400 MG-UNIT TABS Take 2 tablets by mouth daily.   Yes [provider]  carbidopa-levodopa (SINEMET IR) 25-100 MG per tablet Take 2 tablets by mouth 3 (three) times daily. 8am, 12pm, 8pm   Yes [provider]  Cholecalciferol (VITAMIN D) 50 MCG (2000 UT) tablet Take 4,000 Units by mouth daily.   Yes [provider]  cloNIDine (CATAPRES - DOSED IN MG/24 HR) 0.1 mg/24hr patch Place 1 patch (0.1 mg total) onto the skin once a week. Patient taking differently: Place 0.1 mg onto the skin every Monday.  08/03/16  Yes Vassie LollMadera, Carlos, MD  folic acid (FOLVITE) 400 MCG tablet Take 800 mcg by mouth daily.    Yes [provider]  furosemide (LASIX) 20 MG tablet Take 60 mg by mouth daily.   Yes [provider]  hypromellose (GENTEAL) 0.3 % GEL ophthalmic ointment Place 1 application into both eyes every 8 (eight) hours as needed for dry eyes.   Yes [provider]  leuprolide (LUPRON) 22.5 MG injection Inject 22.5 mg into the muscle every 3 (three) months.   Yes [provider]  metoprolol (LOPRESSOR) 50 MG tablet Take 50 mg by mouth daily.    Yes [provider]  mirtazapine (  REMERON) 7.5 MG tablet Take 7.5 mg by mouth at bedtime.   Yes [provider]  pantoprazole (PROTONIX) 40 MG tablet Take 40 mg by mouth daily.   Yes [provider]  furosemide (LASIX) 40 MG tablet Take 1.5 tablets (60 mg total) by mouth daily. Patient not taking: Reported on 04/16/2019 10/01/17 11/10/17  Lenox Ponds, MD    Family History No family history on file.  Social History Social History   Tobacco Use  . Smoking status: Former Games developer  . Smokeless tobacco: Never Used  Substance Use  Topics  . Alcohol use: No  . Drug use: No     Allergies   Patient has no known allergies.   Review of Systems Review of Systems  Unable to perform ROS: Dementia     Physical Exam Updated Vital Signs BP 101/65   Pulse (!) 44   Temp 98.9 F (37.2 C) (Oral)   Resp 19   SpO2 98%   Physical Exam Vitals signs and nursing note reviewed.  Constitutional:      Appearance: He is well-developed.  HENT:     Head: Normocephalic and atraumatic.     Comments: Dry mucous membranes Eyes:     General:        Right eye: No discharge.        Left eye: No discharge.  Neck:     Musculoskeletal: Normal range of motion and neck supple.     Trachea: No tracheal deviation.  Cardiovascular:     Rate and Rhythm: Normal rate and regular rhythm.  Pulmonary:     Effort: Pulmonary effort is normal.     Breath sounds: Rhonchi present.  Abdominal:     General: There is no distension.     Palpations: Abdomen is soft.     Tenderness: There is no abdominal tenderness. There is no guarding.  Musculoskeletal:        General: No swelling.  Skin:    General: Skin is warm.     Findings: No rash.  Neurological:     General: No focal deficit present.     Mental Status: He is alert.     GCS: GCS eye subscore is 4. GCS verbal subscore is 4. GCS motor subscore is 6.     Comments: Patient alert and oriented x2.  Patient moves all extremities equal bilateral upper and lower extremities with general weakness.  Parkinson's features      ED Treatments / Results  Labs (all labs ordered are listed, but only abnormal results are displayed) Labs Reviewed  COMPREHENSIVE METABOLIC PANEL - Abnormal; Notable for the following components:      Result Value   Potassium 5.4 (*)    Glucose, Bld 123 (*)    BUN 126 (*)    Creatinine, Ser 6.21 (*)    Calcium 8.5 (*)    AST 12 (*)    GFR calc non Af Amer 7 (*)    GFR calc Af Amer 9 (*)    Anion gap 16 (*)    All other components within normal limits  CBC  WITH DIFFERENTIAL/PLATELET - Abnormal; Notable for the following components:   RBC 3.83 (*)    Hemoglobin 10.8 (*)    HCT 34.9 (*)    Platelets 144 (*)    Lymphs Abs 0.6 (*)    All other components within normal limits  URINALYSIS, ROUTINE W REFLEX MICROSCOPIC - Abnormal; Notable for the following components:   APPearance TURBID (*)  Hgb urine dipstick SMALL (*)    Protein, ur 100 (*)    Leukocytes,Ua LARGE (*)    WBC, UA >50 (*)    Bacteria, UA MANY (*)    All other components within normal limits  LACTIC ACID, PLASMA - Abnormal; Notable for the following components:   Lactic Acid, Venous 2.4 (*)    All other components within normal limits  CBG MONITORING, ED - Abnormal; Notable for the following components:   Glucose-Capillary 104 (*)    All other components within normal limits  TROPONIN I (HIGH SENSITIVITY) - Abnormal; Notable for the following components:   Troponin I (High Sensitivity) 32 (*)    All other components within normal limits  SARS CORONAVIRUS 2 (HOSPITAL ORDER, PERFORMED IN [facility])  CULTURE, BLOOD (ROUTINE X 2)  URINE CULTURE  CULTURE, BLOOD (ROUTINE X 2)  TSH  MAGNESIUM  LACTIC ACID, PLASMA  BASIC METABOLIC PANEL  CBC  CREATININE, SERUM  HEMOGLOBIN A1C  BASIC METABOLIC PANEL  CBC  TROPONIN I (HIGH SENSITIVITY)    EKG EKG Interpretation  Date/Time:  Sunday April 04 2019 14:08:44 EDT Ventricular Rate:  95 PR Interval:    QRS Duration: 94 QT Interval:  374 QTC Calculation: 415 R Axis:   15 Text Interpretation:  Sinus rhythm Ventricular bigeminy Borderline repolarization abnormality Abnormal ECG Confirmed by Gerhard Munch [phone]) on 04/17/2019 2:24:21 PM   Radiology Dg Chest Port 1 View  Result Date: 04/13/2019 CLINICAL DATA:  Hypotension.  Weakness. EXAM: PORTABLE CHEST 1 VIEW COMPARISON:  09/29/2017 chest radiograph. FINDINGS: Stable cardiomediastinal silhouette with normal heart size. No pneumothorax. No pleural  effusion. Patchy right mid lung opacities new. Clear left lung. IMPRESSION: Patchy right mid lung opacity is new, suspicious for pneumonia. Chest radiograph follow-up advised. Electronically Signed   By: Delbert Phenix M.D.   On: 04/17/2019 15:34    Procedures .Critical Care Performed by: Blane Ohara, MD Authorized by: Blane Ohara, MD   Critical care provider statement:    Critical care time (minutes):  75   Critical care start time:  04/27/2019 3:45 PM   Critical care end time:  04/15/2019 5:00 PM   Critical care time was exclusive of:  Separately billable procedures and treating other patients and teaching time   Critical care was necessary to treat or prevent imminent or life-threatening deterioration of the following conditions:  Sepsis   Critical care was time spent personally by me on the following activities:  Discussions with consultants, evaluation of patient's response to treatment, examination of patient, ordering and performing treatments and interventions, ordering and review of laboratory studies, ordering and review of radiographic studies, pulse oximetry, re-evaluation of patient's condition, obtaining history from patient or surrogate and review of old charts   (including critical care time)  Medications Ordered in ED Medications  0.9 %  sodium chloride infusion (has no administration in time range)  cefTRIAXone (ROCEPHIN) 2 g in sodium chloride 0.9 % 100 mL IVPB (2 g Intravenous New Bag/Given 04/13/2019 1902)  azithromycin (ZITHROMAX) 500 mg in sodium chloride 0.9 % 250 mL IVPB (0 mg Intravenous Stopped 04/19/2019 1836)  aspirin EC tablet 81 mg (has no administration in time range)  mirtazapine (REMERON) tablet 7.5 mg (has no administration in time range)  pantoprazole (PROTONIX) EC tablet 40 mg (has no administration in time range)  folic acid (FOLVITE) tablet 800 mcg (has no administration in time range)  carbidopa-levodopa (SINEMET IR) 25-100 MG per tablet immediate  release 2 tablet (has  no administration in time range)  Calcium Carbonate-Vitamin D3 600-400 MG-UNIT TABS 2 tablet (has no administration in time range)  Vitamin D 4,000 Units (has no administration in time range)  hypromellose (GENTEAL) 0.3 % ophthalmic ointment 1 application (has no administration in time range)  enoxaparin (LOVENOX) injection 30 mg (has no administration in time range)  acetaminophen (TYLENOL) tablet 650 mg (has no administration in time range)    Or  acetaminophen (TYLENOL) suppository 650 mg (has no administration in time range)  polyethylene glycol (MIRALAX / GLYCOLAX) packet 17 g (has no administration in time range)  sodium chloride 0.9 % bolus 1,200 mL (has no administration in time range)  sodium chloride 0.9 % bolus 500 mL (0 mLs Intravenous Stopped 2019-04-08 1903)  calcium gluconate 1 g in sodium chloride 0.9 % 100 mL IVPB (1 g Intravenous New Bag/Given 2019/04/08 1819)  dextrose 50 % solution 50 mL (50 mLs Intravenous Given 04-08-2019 1819)  insulin aspart (novoLOG) injection 5 Units (5 Units Intravenous Given 2019/04/08 1819)  sodium chloride 0.9 % bolus 1,000 mL (0 mLs Intravenous Stopped 04-08-19 1903)     Initial Impression / Assessment and Plan / ED Course  I have reviewed the triage vital signs and the nursing notes.  Pertinent labs & imaging results that were available during my care of the patient were reviewed by me and considered in my medical decision making (see chart for details).       Patient presents with general weakness and low blood pressure at nursing home.  On exam patient is dry clinically, afebrile, denies significant symptoms however is at baseline with mild confusion.  Plan for general work-up including blood work, urinalysis, chest x-ray.  Patient's blood pressure did improve however has had a few low diastolic blood pressures in the 40s and low 50s.  500 cc IV fluids ordered.  Blood work reviewed concerning with creatinine greater than 6 previous  was around 1.5, old records reviewed.  Mild anemia 10.8 close to baseline.  No active bleeding.  With BUN 126 creatinine 6.2 concern for dehydration.  Patient had mild troponin elevation likely mild demand as it is only 32 and patient has no chest pain or shortness of breath.  EKG does have subtle changes and with potassium elevated 5.4 calcium, insulin and glucose ordered IV.  Plan for repeat IV fluids, cultures and sepsis screen ordered.  Chest x-ray consistent with pneumonia patient does have mild tachypnea with borderline blood pressure IV antibiotics ordered.  Cova test pending.  Plan to call spouse to update and admission to hospital.  Repeat fluid boluses ordered with borderline blood pressure.  Continue to follow for concern for sepsis and sepsis treatment.  Patient's heart rate intermittently bradycardic.  Discussed with family medicine team for admission, appreciate their assistance.  Sepsis - Repeat Assessment  Performed at:    202  Vitals     Blood pressure (!) 104/51, pulse 81, temperature 98.9 F (37.2 C), temperature source Oral, resp. rate (!) 28, SpO2 92 %.  Heart:     Regular rate and rhythm  Lungs:              Mild rales  Capillary Refill:   <2 sec  Peripheral Pulse:   Radial pulse palpable  Skin:     Normal Color    The patients results and plan were reviewed and discussed.   Any x-rays performed were independently reviewed by myself.   Differential diagnosis were considered with the presenting HPI.  Medications  0.9 %  sodium chloride infusion (has no administration in time range)  cefTRIAXone (ROCEPHIN) 2 g in sodium chloride 0.9 % 100 mL IVPB (2 g Intravenous New Bag/Given 04/15/2019 1902)  azithromycin (ZITHROMAX) 500 mg in sodium chloride 0.9 % 250 mL IVPB (0 mg Intravenous Stopped 04/23/2019 1836)  aspirin EC tablet 81 mg (has no administration in time range)  mirtazapine (REMERON) tablet 7.5 mg (has no administration in time range)  pantoprazole (PROTONIX) EC  tablet 40 mg (has no administration in time range)  folic acid (FOLVITE) tablet 800 mcg (has no administration in time range)  carbidopa-levodopa (SINEMET IR) 25-100 MG per tablet immediate release 2 tablet (has no administration in time range)  Calcium Carbonate-Vitamin D3 600-400 MG-UNIT TABS 2 tablet (has no administration in time range)  Vitamin D 4,000 Units (has no administration in time range)  hypromellose (GENTEAL) 0.3 % ophthalmic ointment 1 application (has no administration in time range)  enoxaparin (LOVENOX) injection 30 mg (has no administration in time range)  acetaminophen (TYLENOL) tablet 650 mg (has no administration in time range)    Or  acetaminophen (TYLENOL) suppository 650 mg (has no administration in time range)  polyethylene glycol (MIRALAX / GLYCOLAX) packet 17 g (has no administration in time range)  sodium chloride 0.9 % bolus 1,200 mL (has no administration in time range)  sodium chloride 0.9 % bolus 500 mL (0 mLs Intravenous Stopped 04/01/2019 1903)  calcium gluconate 1 g in sodium chloride 0.9 % 100 mL IVPB (1 g Intravenous New Bag/Given 04/12/2019 1819)  dextrose 50 % solution 50 mL (50 mLs Intravenous Given 04/18/2019 1819)  insulin aspart (novoLOG) injection 5 Units (5 Units Intravenous Given 04/19/2019 1819)  sodium chloride 0.9 % bolus 1,000 mL (0 mLs Intravenous Stopped 04/17/2019 1903)    Vitals:   04/02/2019 1826 04/11/2019 1830 04/16/2019 1845 04/17/2019 1900  BP: (!) 89/59 (!) 91/48 (!) 93/52 101/65  Pulse: (!) 44     Resp: (!) 22 17 (!) 22 19  Temp:      TempSrc:      SpO2: 98%       Final diagnoses:  Acute renal failure, unspecified acute renal failure type (HCC)  Hypotension due to hypovolemia  Hyperkalemia  Acute cystitis with hematuria    Admission/ observation were discussed with the admitting physician, patient and/or family and they are comfortable with the plan.   Kevin Roman was evaluated in Emergency Department on 04/21/2019 for the symptoms  described in the history of present illness. He was evaluated in the context of the global COVID-19 pandemic, which necessitated consideration that the patient might be at risk for infection with the SARS-CoV-2 virus that causes COVID-19. Institutional protocols and algorithms that pertain to the evaluation of patients at risk for COVID-19 are in a state of rapid change based on information released by regulatory bodies including the CDC and federal and state organizations. These policies and algorithms were followed during the patient's care in the ED.   Final Clinical Impressions(s) / ED Diagnoses   Final diagnoses:  Acute renal failure, unspecified acute renal failure type (HCC)  Hypotension due to hypovolemia  Hyperkalemia  Acute cystitis with hematuria  Sepsis  ED Discharge Orders    None       Blane Ohara, MD 04/15/2019 [phone]

## 2019-04-04 NOTE — ED Notes (Signed)
Pt's CBG result was 104. Informed [place] - RN.

## 2019-04-04 NOTE — ED Notes (Signed)
Date and time results received: 04/21/2019 1735 (use smartphrase ".now" to insert current time)  Test: lactic Critical Value: 2.4  Name of Provider Notified: admitting team at bedside  Orders Received? Or Actions Taken?: admitting team aware, code sepsos has already been called

## 2019-04-04 NOTE — ED Notes (Signed)
Attempted placing pulse ox sensor on hand and ear still not picking up good wave form.

## 2019-04-04 NOTE — ED Notes (Signed)
Unable to collect blood. 

## 2019-04-04 NOTE — ED Notes (Signed)
Assessed pt's skin for presence of clonidine patch did not see patch present.

## 2019-04-05 ENCOUNTER — Inpatient Hospital Stay (HOSPITAL_COMMUNITY): Payer: Medicare Other

## 2019-04-05 DIAGNOSIS — R531 Weakness: Secondary | ICD-10-CM

## 2019-04-05 DIAGNOSIS — I9589 Other hypotension: Secondary | ICD-10-CM

## 2019-04-05 DIAGNOSIS — E861 Hypovolemia: Secondary | ICD-10-CM

## 2019-04-05 LAB — BASIC METABOLIC PANEL
Anion gap: 15 (ref 5–15)
BUN: 130 mg/dL — ABNORMAL HIGH (ref 8–23)
CO2: 16 mmol/L — ABNORMAL LOW (ref 22–32)
Calcium: 7.4 mg/dL — ABNORMAL LOW (ref 8.9–10.3)
Chloride: 103 mmol/L (ref 98–111)
Creatinine, Ser: 5.95 mg/dL — ABNORMAL HIGH (ref 0.61–1.24)
GFR calc Af Amer: 9 mL/min — ABNORMAL LOW (ref >60)
GFR calc non Af Amer: 8 mL/min — ABNORMAL LOW (ref >60)
Glucose, Bld: 109 mg/dL — ABNORMAL HIGH (ref 70–99)
Potassium: 4.5 mmol/L (ref 3.5–5.1)
Sodium: 134 mmol/L — ABNORMAL LOW (ref 135–145)

## 2019-04-05 LAB — CBC
HCT: 29.9 % — ABNORMAL LOW (ref 39.0–52.0)
Hemoglobin: 9.6 g/dL — ABNORMAL LOW (ref 13.0–17.0)
MCH: 29.2 pg (ref 26.0–34.0)
MCHC: 32.1 g/dL (ref 30.0–36.0)
MCV: 90.9 fL (ref 80.0–100.0)
Platelets: 149 10*3/uL — ABNORMAL LOW (ref 150–400)
RBC: 3.29 MIL/uL — ABNORMAL LOW (ref 4.22–5.81)
RDW: 14.1 % (ref 11.5–15.5)
WBC: 5.4 10*3/uL (ref 4.0–10.5)
nRBC: 0 % (ref 0.0–0.2)

## 2019-04-05 LAB — TROPONIN I (HIGH SENSITIVITY): Troponin I (High Sensitivity): 34 ng/L — ABNORMAL HIGH (ref <18)

## 2019-04-05 LAB — HEMOGLOBIN A1C
Hgb A1c MFr Bld: 5 % (ref 4.8–5.6)
Mean Plasma Glucose: 96.8 mg/dL

## 2019-04-05 LAB — CBG MONITORING, ED: Glucose-Capillary: 83 mg/dL (ref 70–99)

## 2019-04-05 LAB — LACTIC ACID, PLASMA: Lactic Acid, Venous: 1.3 mmol/L (ref 0.5–1.9)

## 2019-04-05 MED ORDER — EPINEPHRINE 1 MG/10ML IJ SOSY
PREFILLED_SYRINGE | INTRAMUSCULAR | Status: AC | PRN
  Administered 2019-04-05: 1 via INTRAVENOUS

## 2019-04-05 MED ORDER — DEXTROSE 5 % IV SOLN
INTRAVENOUS | Status: AC | PRN
  Administered 2019-04-05: 150 mg via INTRAVENOUS
  Administered 2019-04-05: 06:00:00 300 mg via INTRAVENOUS

## 2019-04-05 MED ORDER — CALCIUM CARBONATE-VITAMIN D 500-200 MG-UNIT PO TABS
2.0000 | ORAL_TABLET | Freq: Every day | ORAL | Status: DC
  Filled 2019-04-05: qty 2

## 2019-04-05 MED ORDER — ATROPINE SULFATE 1 MG/ML IJ SOLN
INTRAMUSCULAR | Status: AC | PRN
  Administered 2019-04-05: 1 mg via INTRAVENOUS

## 2019-04-05 MED ORDER — VANCOMYCIN VARIABLE DOSE PER UNSTABLE RENAL FUNCTION (PHARMACIST DOSING): Status: DC

## 2019-04-05 MED ORDER — SODIUM BICARBONATE 8.4 % IV SOLN
INTRAVENOUS | Status: AC | PRN
  Administered 2019-04-05: 50 meq via INTRAVENOUS

## 2019-04-05 MED ORDER — EPINEPHRINE 1 MG/10ML IJ SOSY
PREFILLED_SYRINGE | INTRAMUSCULAR | Status: AC | PRN
  Administered 2019-04-05 (×6): 1 via INTRAVENOUS
  Administered 2019-04-05 (×2): 1 mg via INTRAVENOUS

## 2019-04-05 MED ORDER — SODIUM CHLORIDE 0.9 % IV SOLN: 2.0000 g | Freq: Once | INTRAVENOUS | Status: DC

## 2019-04-05 MED ORDER — VANCOMYCIN HCL IN DEXTROSE 1-5 GM/200ML-% IV SOLN: 1000.0000 mg | Freq: Once | INTRAVENOUS | Status: DC

## 2019-04-05 MED ORDER — NOREPINEPHRINE 4 MG/250ML-% IV SOLN
0.0000 ug/min | INTRAVENOUS | Status: DC
  Administered 2019-04-05: 10 ug/min via INTRAVENOUS

## 2019-04-06 ENCOUNTER — Telehealth: Payer: Self-pay | Admitting: General Practice

## 2019-04-06 LAB — URINE CULTURE: Culture: >=100000 — AB

## 2019-04-06 MED FILL — Medication: Qty: 1 | Status: AC

## 2019-04-06 NOTE — Telephone Encounter (Signed)
Death Certificate was dropped off and has been placed in PCP (in this case Dr. Tammi Klippel who I was informed saw the patient in the hospital as they have no pcp) box for completion. Please use blue or black ink, no strike throughs, no white out, no highlite. Please return to [place] once completed.

## 2019-04-06 NOTE — Telephone Encounter (Signed)
Please place in Dr. Verlon Au inbox. I had spoken to Dr. Nori Riis at time of death and Dr. Nori Riis informed me she would complete death certificate. If Dr. Nori Riis is unable, please give to [facility] on inpatient service ([place] [facility])  Dalphine Handing, PGY-3 [facility] 04/06/2019 7:42 PM

## 2019-04-09 LAB — CULTURE, BLOOD (ROUTINE X 2): Culture: NO GROWTH

## 2019-04-10 ENCOUNTER — Telehealth: Payer: Self-pay | Admitting: Emergency Medicine

## 2019-05-02 NOTE — ED Notes (Signed)
Stuck pt x2unble get the blood report to nurse

## 2019-05-02 NOTE — Progress Notes (Signed)
FPTS Interim Progress Note  Spoke to patient's family including son Kevin Roman) as well as patient's wife Kevin Roman) who was on the phone as well.  Informed them of patient's passing and results of code.  I was able to answer all questions at that time.  Patient's son was very appreciative of the care he received in the hospital and the communication we were able to have with him.  Patient's son states he would like to come to the hospital to see the body. I informed RN that patient's family would be coming to the ED.    Caroline More, DO April 27, 2019, 6:38 AM PGY-3, [place] [facility] Service pager [phone]

## 2019-05-02 NOTE — Progress Notes (Addendum)
FPTS Interim Progress Note  S:Paged to bedside and notified that code team and internal medicine were coding patient. Dr. Tammi Klippel and myself immediately reported to bedside to assist with code. Upon arrival, intermal medicine physicians along with ED nurses proceeding with code algorithm, patient is unresponsive and has been intubated. Family Med attending, Dr. Nori Riis, made aware of patient's status. Will continue to follow code protocol and monitor for ROSC.   O: BP (!) 75/25   Pulse (!) 101   Temp 98.1 F (36.7 C) (Oral)   Resp 20   SpO2 (!) 86%   Attempted to call family multiple times at number listed under patient's wife number. Kevin Roman reported that she had difficulty hearing and asked if I could speak with her son, Kevin Roman, instead. Informed family of ongoing code and change in his father's condition. Mr. Kevin Roman had this provider on speaker phone so that Kevin Roman was able to listen. I answered questions and informed family that someone from the family medicine teaching service would call with updates as soon as possible. [name] provided his number to be put in the chart.   [name], MD 04/02/2019, 5:45 AM PGY-1, [place] [facility] pager [phone]

## 2019-05-02 NOTE — Death Summary Note (Addendum)
[facility] Death Summary  Patient name: Kevin Roman Medical record number: [id number] Date of birth: Jan 31, 1930 Age: 85 y.o. Gender: male Date of Admission: Apr 16, 2019  Date of Death: 17-Apr-2019 Admitting Physician: No admitting provider for patient encounter.  Primary Care Provider: Patient, No Pcp Per Consultants: CCM, Gen Surg  Indication for Hospitalization: Generalized Weakness   Brief Hospital Course:  Kevin Roman was a 85 y.o. male presenting with generalized weakness thought to be secondary to urosepsis versus pneumonia versus other etiology.  Patient was admitted on 04/16/23 with 1 to 2 days of generalized weakness and fatigue noted by the facility he lives at.  On admission patient met sepsis criteria with hypotension as well as tachycardia and lactic acidosis.  Possible sources thought to be pneumonia given chest x-ray findings as well as coarse breath sounds on exam.  Patient was placed on 1 L normal nasal cannula by the ED as well for desaturations.  Labs were consistent with a possible UTI leading to suspicious urosepsis cause.  Patient was started on normal saline as well as receiving multiple units of normal saline boluses.  Head CT was obtained given patient's somewhat confusion but this showed no acute abnormalities.  Patient was also hyperkalemic with potassium of 5.4.  Received calcium gluconate, insulin, IV fluid boluses.  Repeat BMP was within normal limits.  Patient also had an AKI on admission with a creatinine elevated to 6.21.  Of note patient does have a history of paroxysmal A. Fib.  During the night of patient's admission patient started to worsen in status.  There was significant difficulty getting blood work as well as IVs.  We informed both CCM and ED of difficulty with access. Informed that he may need more invasive access such as central line. ED physician was able to obtain IV via ultrasound.   Patient also very quickly began to have  a distended abdomen and had bilious emesis.  A stat KUB was ordered showing small bowel obstruction.  General surgery was immediately consulted and NG tube was placed.  Per ED physician patient then had a very large emesis and began to become bradycardic.  Code was called this patient was full code.  Patient received CPR as well as intubation.  Patient received epinephrine, amiodarone, bicarb. Patient also received multiple attempts at defibrillation. (See code documentation by Dr. Betsey Holiday for specifics).  Despite full attempts of resuscitation patient was unable to obtain a pulse.  Time of death was then called. Attending on service was informed of code when it was called and death of patient.   Family was informed by primary team.  Family was appreciative of call and the care patient received in the hospital.  Family stated they will be coming to the hospital.  Significant Procedures:  IO Line insertion  Arterial stick for lab draws  US guided IV  NG tube placement Intubation   Significant Labs and Imaging:  Recent Labs  Lab April 16, 2019 1423 04/17/19 0057  WBC 7.5 5.4  HGB 10.8* 9.6*  HCT 34.9* 29.9*  PLT 144* 149*   Recent Labs  Lab 2019/04/16 1423 2019/04/17 0057  NA 138 134*  K 5.4* 4.5  CL 99 103  CO2 23 16*  GLUCOSE 123* 109*  BUN 126* 130*  CREATININE 6.21* 5.95*  CALCIUM 8.5* 7.4*  MG 2.2  --   ALKPHOS 48  --   AST 12*  --   ALT <5  --   ALBUMIN 3.7  --  Ref. Range 04/26/2019 16:47 05/05/2019 00:57  Lactic Acid, Venous Latest Ref Range: 0.5 - 1.9 mmol/L 2.4 (HH) 1.3   Ct Head Wo Contrast  Result Date: 04/20/2019 CLINICAL DATA:  85 year old male with generalized weakness. Hypotension. EXAM: CT HEAD WITHOUT CONTRAST TECHNIQUE: Contiguous axial images were obtained from the base of the skull through the vertex without intravenous contrast. COMPARISON:  None. FINDINGS: Brain: Very mild cerebral atrophy. Patchy areas of mild decreased attenuation are noted throughout the  deep and periventricular white matter of the cerebral hemispheres bilaterally, compatible with mild chronic microvascular ischemic disease. No evidence of acute infarction, hemorrhage, hydrocephalus, extra-axial collection or mass lesion/mass effect. Vascular: No hyperdense vessel or unexpected calcification. Skull: Normal. Negative for fracture or focal lesion. Sinuses/Orbits: No acute finding. Other: None. IMPRESSION: 1. No acute intracranial abnormalities. 2. Mild cerebral atrophy with mild chronic microvascular ischemic changes in the cerebral white matter. Electronically Signed   By: Vinnie Langton M.D.   On: 04/14/2019 19:25   Dg Chest Port 1 View  Result Date: 04/23/2019 CLINICAL DATA:  Hypotension.  Weakness. EXAM: PORTABLE CHEST 1 VIEW COMPARISON:  09/29/2017 chest radiograph. FINDINGS: Stable cardiomediastinal silhouette with normal heart size. No pneumothorax. No pleural effusion. Patchy right mid lung opacities new. Clear left lung. IMPRESSION: Patchy right mid lung opacity is new, suspicious for pneumonia. Chest radiograph follow-up advised. Electronically Signed   By: Ilona Sorrel M.D.   On: 04/06/2019 15:34   Dg Abd Portable 1v  Result Date: 05-05-19 CLINICAL DATA:  Abdominal distention and vomiting EXAM: PORTABLE ABDOMEN - 1 VIEW COMPARISON:  07/24/2016 FINDINGS: Dilated loops of small bowel in the central abdomen. The stomach is likely fluid distended. Small bowel loops measure up to nearly 6 cm. No concerning mass effect or gas collection. Streaky density at the lung bases, asymmetric to the right-known from yesterday. IMPRESSION: 1. Dilated stomach and small bowel with an obstructive pattern 2. Right-sided infiltrate, likely pneumonia or aspiration. Electronically Signed   By: Monte Fantasia M.D.   On: 05/05/2019 05:04     Results/Tests Pending at Time of Death:  Unresulted Labs (From admission, onward)    Start     Ordered   04/11/19 0500  Creatinine, serum  (enoxaparin  (LOVENOX)    CrCl < 30 ml/min)  Weekly,   R    Comments: while on enoxaparin therapy.    04/03/2019 1904   05-05-2019 0076  Basic metabolic panel  Tomorrow morning,   R     04/09/2019 1904   2019/05/05 0500  CBC  Tomorrow morning,   R     04/28/2019 1904   04/13/2019 2263  Basic metabolic panel  Once,   STAT     04/22/2019 1829   04/21/2019 1941  Lactic acid, plasma  ONCE - STAT,   STAT     04/03/2019 1940   04/29/2019 1625  Blood culture (routine x 2)  BLOOD CULTURE X 2,   STAT     04/22/2019 1624   04/02/2019 1540  Urine culture  ONCE - STAT,   STAT     04/07/2019 [place], [place], DO 05-May-2019, 7:10 AM PGY-3, [facility]

## 2019-05-02 NOTE — ED Provider Notes (Signed)
[place] [facility]  Department of Emergency Medicine   Code Blue CONSULT NOTE  Chief Complaint: Cardiac arrest/unresponsive   Level V Caveat: Unresponsive  History of present illness: I was contacted by the hospital for a CODE BLUE cardiac arrest and presented to the patient's bedside.   Patient admitted to teaching service.  Code was called and I responded to the room.  Patient had sudden episode of large-volume emesis, bradycardia and then lost pulses.  ROS: Unable to obtain, Level V caveat  Scheduled Meds: . aspirin EC  81 mg Oral Daily  . Calcium Carbonate-Vitamin D3  2 tablet Oral Daily  . carbidopa-levodopa  2 tablet Oral TID  . enoxaparin (LOVENOX) injection  30 mg Subcutaneous Q24H  . folic acid  1 mg Oral Daily  . mirtazapine  7.5 mg Oral QHS  . vancomycin variable dose per unstable renal function (pharmacist dosing)   Does not apply See admin instructions  . Vitamin D  4,000 Units Oral Daily   Continuous Infusions: . sodium chloride Stopped (04-17-2019 0612)  . ceFEPime (MAXIPIME) IV    . norepinephrine (LEVOPHED) Adult infusion Stopped (17-Apr-2019 0612)  . vancomycin     PRN Meds:.acetaminophen **OR** acetaminophen, hypromellose, polyethylene glycol Past Medical History:  Diagnosis Date  . Hypertension   . Parkinson's disease    Past Surgical History:  Procedure Laterality Date  . INGUINAL HERNIA REPAIR Left 07/26/2016   Procedure: HERNIA REPAIR INGUINAL ADULT;  Surgeon: Johnathan Hausen, MD;  Location: [facility];  Service: General;  Laterality: Left;  . PROSTATE SURGERY     Social History   Socioeconomic History  . Marital status: Married    Spouse name: Not on file  . Number of children: Not on file  . Years of education: Not on file  . Highest education level: Not on file  Occupational History  . Not on file  Social Needs  . Financial resource strain: Not on file  . Food insecurity    Worry: Not on file    Inability: Not on file  . Transportation  needs    Medical: Not on file    Non-medical: Not on file  Tobacco Use  . Smoking status: Former Research scientist (life sciences)  . Smokeless tobacco: Never Used  Substance and Sexual Activity  . Alcohol use: No  . Drug use: No  . Sexual activity: Never  Lifestyle  . Physical activity    Days per week: Not on file    Minutes per session: Not on file  . Stress: Not on file  Relationships  . Social Herbalist on phone: Not on file    Gets together: Not on file    Attends religious service: Not on file    Active member of club or organization: Not on file    Attends meetings of clubs or organizations: Not on file    Relationship status: Not on file  . Intimate partner violence    Fear of current or ex partner: Not on file    Emotionally abused: Not on file    Physically abused: Not on file    Forced sexual activity: Not on file  Other Topics Concern  . Not on file  Social History Narrative  . Not on file   No Known Allergies  Last set of Vital Signs (not current) Vitals:   April 17, 2019 0606 17-Apr-2019 0607  BP: (!) 126/96   Pulse:    Resp: 14 18  Temp:    SpO2:  Physical Exam  Gen: unresponsive Cardiovascular: pulseless  Resp: apneic. Breath sounds equal bilaterally with bagging  Abd: distended Neuro: GCS 3, unresponsive to pain  HEENT: large amount of vomit in posterior pharynx, gag reflex absent  Neck: No crepitus  Musculoskeletal: No deformity  Skin: warm  Procedures  INTUBATION Performed by: Orpah Greek Required items: required blood products, implants, devices, and special equipment available Patient identity confirmed: provided demographic data and hospital-assigned identification number Time out: Immediately prior to procedure a "time out" was called to verify the correct patient, procedure, equipment, support staff and site/side marked as required. Indications: respiratory arrest Intubation method: direct laryngoscopy with Mac 4 Preoxygenation:  BVM Sedatives: none Paralytic: none Tube Size: 7.5 cuffed Post-procedure assessment: chest rise and ETCO2 monitor Breath sounds: equal and absent over the epigastrium Tube secured by Respiratory Therapy Patient tolerated the procedure well with no immediate complications.  Angiocath insertion Performed by: Orpah Greek  Consent: Verbal consent obtained. Risks and benefits: risks, benefits and alternatives were discussed Time out: Immediately prior to procedure a "time out" was called to verify the correct patient, procedure, equipment, support staff and site/side marked as required.  Preparation: Patient was prepped and draped in the usual sterile fashion. Vein Location: left EJ Gauge: 20  Normal blood return and flush without difficulty Patient tolerance: Patient tolerated the procedure well with no immediate complications.     CRITICAL CARE Performed by: Orpah Greek Total critical care time: 45 Critical care time was exclusive of separately billable procedures and treating other patients. Critical care was necessary to treat or prevent imminent or life-threatening deterioration. Critical care was time spent personally by me on the following activities: development of treatment plan with patient and/or surrogate as well as nursing, discussions with consultants, evaluation of patient's response to treatment, examination of patient, obtaining history from patient or surrogate, ordering and performing treatments and interventions, ordering and review of laboratory studies, ordering and review of radiographic studies, pulse oximetry and re-evaluation of patient's condition.  Cardiopulmonary Resuscitation (CPR) Procedure Note Directed/Performed by: Orpah Greek I personally directed ancillary staff and/or performed CPR in an effort to regain return of spontaneous circulation and to maintain cardiac, neuro and systemic perfusion.    Medical Decision making   Patient found in cardiopulmonary arrest.  He was pulseless upon my arrival.  CPR was initiated.  ACLS protocols were followed.  Patient administered epinephrine every 3 minutes with pulse checks.  Pulse checks were intermittently asystole and V. tach/V. fib.  Patient did receive 3 total shocks for V. tach/V. fib without improvement.  He was administered amiodarone 300 mg IV push.  A second push of 150 mg was performed for refractory V. tach/V. fib.  Patient administered 2 doses total of bicarb for prolonged downtime.  After 9 epinephrines and approximately 30 minutes of CPR, pulse check revealed QRS complexes which were perfused.  This was a bradycardia, administered atropine with improved heart rate.  Patient initiated on Levophed.  ROSC was brief and then patient lost pulses and became asystolic again.  An additional dose of epinephrine was administered and circulated.  Repeat check revealed continued asystole, patient declared dead at 06:12.  Discussed with Lanny Hurst, patient not a medical examiner case.  Treatment team present at bedside, they are making contact with patient's family.     Orpah Greek, MD May 01, 2019 [phone]

## 2019-05-02 NOTE — ED Provider Notes (Signed)
Procedures IO LINE INSERTION  Date/Time: 04/11/2019 5:45 AM Performed by: Antonietta Breach, PA-C Authorized by: Antonietta Breach, PA-C   Consent:    Consent obtained:  Emergent situation Procedure details:    Insertion site:  L proximal tibia   Insertion device:  15 gauge IO needle   Insertion: Needle was inserted through the bony cortex     Number of attempts:  1   Insertion confirmation:  Easy infusion of fluids and stability of the needle Comments:     Emergent IO line inserted during cardiac arrest for additional IV access.   (including critical care time)    Antonietta Breach, PA-C 04/10/2019 0547    Orpah Greek, MD 04/15/2019 [phone]

## 2019-05-02 NOTE — Progress Notes (Addendum)
FPTS Interim Progress Note  Spoke to ED nurse who informed me that patient was having difficult lab draws.  Multiple nurses have attempted as well as IV team.  Patient is also blown 2 IVs already.  Patient is being admitted for possible urosepsis with elevated lactic acid.  Patient also hyperkalemic and required calcium gluconate.  Given that patient will need frequent lab draws I consulted both ICU and ED physicians to see if they could obtain access.  Spoke to ED PA who informed me he would attempt a peripheral line using an ultrasound and if unable to obtain access through that we would try a possible central line or other forms of access (will leave to ED discretion on forms of access).  Patient will need this for both IV fluids as well as lab draws.  Patient remains hypotensive so will likely need increase fluids throughout the night. Paged ELink to [place] them of patient who informed me to call back if consult was needed.   Paged Dr. Nori Riis to inform her of this who agrees with plan.  AddendumMartin Majestic to evaluate patient ED physician Dr. Betsey Holiday at bedside to attempt US guided IV.   Caroline More, DO Apr 09, 2019, 12:23 AM PGY-3, [facility] pager [phone]

## 2019-05-02 NOTE — ED Notes (Addendum)
Pt being changed rolled to the side and had projectile vomiting.  Pt's abdomen is distended. Dr. Rosita Fire and Dr. Tammi Klippel paged to bedside.

## 2019-05-02 NOTE — ED Notes (Signed)
Dr. Tammi Klippel to bedside as she was notified of inability to get blood.  Dr. Betsey Holiday to bedside to attempt ultrasound IV.

## 2019-05-02 NOTE — ED Notes (Signed)
Critical care to bedside to discuss recommendations.  NG tube to be placed.  During attempt to insert NG pt began to brady down.  Dr. Betsey Holiday to bedside. Code blue called.

## 2019-05-02 NOTE — Code Documentation (Signed)
Bedside US--no cardiac activity

## 2019-05-02 NOTE — ED Notes (Signed)
IV access has been difficult to maintain.  Remaining IV is still infusing however questionable how long it will remain patent. Dr. Betsey Holiday had to preform arterial stick to obtain blood for lab specimens. Pt has had multiple staff members attempt blood draw w/o success.  Dr. Rosita Fire and Dr. Tammi Klippel are both aware.

## 2019-05-02 NOTE — Progress Notes (Signed)
FPTS Interim Progress Note  S: Paged to bedside at 4:01 in order to re-assess patient.  Nursing states that patient had an episode of projectile foul-smelling emesis with distended abdomen that had developed acutely within previous 15 minutes before paging.  Dr. Tammi Klippel and myself immediately reported to bedside to find patient uncomfortable appearing and lying in bed with foul smell in the exam room.   O: BP (!) 100/49   Pulse (!) 26   Temp 98.1 F (36.7 C) (Oral)   Resp 20   SpO2 95%   General: Elderly male lying in bed, uncomfortable appearing Abdomen: distended abdomen with no bowel sounds appreciated, patient does not respond to palpation Neuro: patient not responsive to examiner and lying with eyes closed in bed    A/P: Given patient's critical and acute changes condition concerning for bowel obstruction, consult placed for critical care.  Discussed case and new abdominal exam changes with Dr. Oletta Darter.  We will await results of stat KUB and if abnormal or signs of obstruction, will consult general surgery in place nasogastric tube. -CCM to evaluate patient.  -STAT portable KUB ordered  [name], MD 04/20/19, 4:37 AM PGY-1, [facility] pager [phone]

## 2019-05-02 NOTE — Progress Notes (Signed)
FPTS Interim Progress Note  Stat portable abdominal x-ray showing dilated stomach and small bowel with an obstructive pattern.  This is consistent with physical exam findings of distended abdomen as well as projectile bilious emesis.  Patient also with no bowel sounds.  General surgery consulted.  Spoke to Dr. Grandville Silos over the phone who stated they will come see patient.  We will plan to place NG tube as well, order has been placed.  I called down to the ED to inform RN of this.  O: BP (!) 100/49   Pulse (!) 26   Temp 98.1 F (36.7 C) (Oral)   Resp 20   SpO2 95%     A/P: SBO -gen surgery consulted, appreciate recommendations -place NG tube -continue to keep patient NPO   Caroline More, DO 2019-04-15, 5:11 AM PGY-3, [facility] pager [phone]

## 2019-05-02 NOTE — Consult Note (Addendum)
NAME:  Kevin Roman, MRN:  [id number], DOB:  [birth year], LOS: 1 ADMISSION DATE:  2019-04-14, CONSULTATION DATE:  04/07/2019 REFERRING MD:  FPTS CHIEF COMPLAINT: N&V, hypotension   Brief History   85yo M admitted under FPTS for generalized weakness, secondary to possible vs PNA vs other etiology with A/CKD 3 who developed  borderline hypotension and projectile vomiting with poor venous access. CCM consulted for further management recommendations.    History of present illness   85yo M PMH Parkinsons, Afib, HTN, HLD, CKDIII, depression, SBO, incarcerated hernia s/p repair, Diastolic CHF with PH by echo 09/29/2017, baseline A&Ox2 normally ambulates with walker presented to ED from [facility] care for low blood pressure and generalized weakness. Initial W/u revealed possible UTI with R side pneumonia and A/CKD III with BUN 120, Crea 6.21, baseline Crea around 1.5, mildly elevated lactic acid at 2.4, repeat 1.3, normal WBC, K+ 5.4-treated with insulin, D50, CaGlu. CT Head negative for acute process. He received IVF and antibiotics and admitted to FPTS. He remained in ED while awaiting bed and developed low normal BP, and projectile vomiting. KUB c/w SBO. Difficulty with obtaining IV access. CCM consulted for management assistance.   Past Medical History  Parkinsons Afib HTN HLD CKDIII Depression SBO incarcerated hernia s/p repair Diastolic CHF with PH by echo 09/29/2017 baseline A&Ox2 normally ambulates with walker  Significant Hospital Events   10/4 Admitted   Consults:  CCM Surgery   Procedures:  NA   Significant Diagnostic Tests:  KUB 04/19/2019 1. Dilated stomach and small bowel with an obstructive pattern 2. Right-sided infiltrate, likely pneumonia or aspiration. CT Head Apr 14, 2019 1. No acute intracranial abnormalities. 2. Mild cerebral atrophy with mild chronic microvascular ischemic changes in the cerebral white matter.   CXR 2019/04/14 Patchy right mid lung opacity is  new, suspicious for pneumonia.  Echo 09/29/2017 Study Conclusions  - Left ventricle: The cavity size was normal. Wall thickness was   normal. Systolic function was mildly reduced. The estimated   ejection fraction was in the range of 45% to 50%. Features are   consistent with a pseudonormal left ventricular filling pattern,   with concomitant abnormal relaxation and increased filling   pressure (grade 2 diastolic dysfunction). - Left atrium: The atrium was severely dilated. - Right atrium: The atrium was moderately dilated. - Pulmonary arteries: Systolic pressure was moderately increased.   PA peak pressure: 59 mm Hg (S).  Micro Data:  SARS coronavirus 2 negative 10/4: Blood>> 10/4: Urine>>  Antimicrobials:  Ceftriaxone 10/4 x 1 dose Azithromycin 10/4 x 1 dose Cefepime 10/5>> Vancomycin 10/5>>  Interim history/subjective:  Pt c/o RLQ ABD pain on assessment. One PIV with NaCl infusing.  MAPs >65.   Objective   Blood pressure (!) 100/49, pulse (!) 26, temperature 98.1 F (36.7 C), temperature source Oral, resp. rate 20, SpO2 95 %.        Intake/Output Summary (Last 24 hours) at 04/10/2019 0454 Last data filed at 2019-04-14 2120 Gross per 24 hour  Intake 3150 ml  Output -  Net 3150 ml   There were no vitals filed for this visit.  Examination: General: Elderly, well-developed, thin, NAD HENT: Normocephalic, PERRL. Moist mucus membranes. Edentulous  Neck: No JVD. Trachea midline. No thyromegaly, no lymphadenopathy CV: IRRR. S1S2. No MRG. +2 distal pulses Lungs: BBS present, clear, FNL, symmetrical ABD: Rounded, distended. High pitch BS x4, firm, tenderness to light palp RLQ, + distension. Large vertical incisional scar. No masses, guarding or rigidity GU: No Foley  EXT: MAE well. No edema Skin: Pale, periphery is cool, central warm. Anterior surfaces In tact. No rashes or lesions Neuro: A&Ox1. No focal deficits     Resolved Hospital Problem list   NA   Assessment & Plan:  85 year old man with a history ofParkinsons, Afib, HTN, HLD, CKDIII, depression, SBO, incarcerated hernia s/p repair, Diastolic CHF with PH by echo, admitted with weakness and mild hypotension, found to have mild pulm infiltrate suggestive of pneumonia, UTI, worsening renal failure and uremia, developed large emesis and worsening abd distention while in ED.   1. Hypotension, weakness: likely 2/2 sepsis, 2/2 UTI and or PNA.  Possible bowel obstruction led to aspiration?.   Per FP notes, nausea and vomiting is not unusual for him prior to bowel movement.  BP104/56 at this time, HR in 80s.  RR low 20s, looks comfortable.   Will broaden antibiotics to cover MDRs given residence and level of illness.   Lactate improving, mixed anion gap and  non anion gap acidosis likely 2/2 uremia, crystaloid solutions.  Agree with cont fluid infusion 125 per hr.   No indication for central line at this time, has access with peripheral IV, Consider midline or picc line during day given difficulty obtaining labs.   2. Abdominal distention: AXR suggestive of bowel obstruction.  Bowel sounds present on exam.  Abd distended but not hard, minimal obvious tenderness.   NG tube to be placed rec intermittent suction, surgical consult.  Recommend doing upright Abdominal x ray to eval for free air.    3. UTI - foley to be placed to ensure complete drainage (lupron on his med list, wonder about some degree of obstruction).    4. Worsening CKD - unknown timeline, could be acute on chronic renal failure vs worsening CKD.  Uremia.    5. AMS: some underlying dementia at baseline (per notes from SNF, knows the difference between "night and day").   Worsening mental status per nurse, possibly 2/2 delirium/sundowning +/- sepsis related delirium.   Monitor.   At this time, ok for progressive care, cont monitor.  We will follow along as needed and are of course available in additional access is needed.   Best  practice:  Diet: NPO, rec NG to intermittent suction.  Pain/Anxiety/Delirium protocol (if indicated):  VAP protocol (if indicated): DVT prophylaxis:  GI prophylaxis:  Glucose control:  Mobility:  Code Status: Full  Family Communication: per primary team  Disposition:   Labs   CBC: Recent Labs  Lab 2019/05/22 1423 04/22/2019 0057  WBC 7.5 5.4  NEUTROABS 6.3  --   HGB 10.8* 9.6*  HCT 34.9* 29.9*  MCV 91.1 90.9  PLT 144* 149*    Basic Metabolic Panel: Recent Labs  Lab 2019/05/22 1423 04/09/2019 0057  NA 138 134*  K 5.4* 4.5  CL 99 103  CO2 23 16*  GLUCOSE 123* 109*  BUN 126* 130*  CREATININE 6.21* 5.95*  CALCIUM 8.5* 7.4*  MG 2.2  --    GFR: CrCl cannot be calculated (Unknown ideal weight.). Recent Labs  Lab 2019/05/22 1423 2019/05/22 1647 04/14/2019 0057  WBC 7.5  --  5.4  LATICACIDVEN  --  2.4* 1.3    Liver Function Tests: Recent Labs  Lab 2019/05/22 1423  AST 12*  ALT <5  ALKPHOS 48  BILITOT 0.7  PROT 7.4  ALBUMIN 3.7   No results for input(s): LIPASE, AMYLASE in the last 168 hours. No results for input(s): AMMONIA in the last 168 hours.  ABG No results found for: PHART, PCO2ART, PO2ART, HCO3, TCO2, ACIDBASEDEF, O2SAT   Coagulation Profile: No results for input(s): INR, PROTIME in the last 168 hours.  Cardiac Enzymes: No results for input(s): CKTOTAL, CKMB, CKMBINDEX, TROPONINI in the last 168 hours.  HbA1C: Hgb A1c MFr Bld  Date/Time Value Ref Range Status  2019-04-20 12:57 AM 5.0 4.8 - 5.6 % Final    Comment:    (NOTE) Pre diabetes:          5.7%-6.4% Diabetes:              >6.4% Glycemic control for   <7.0% adults with diabetes     CBG: Recent Labs  Lab 04/27/2019 1423  GLUCAP 104*    Review of Systems:   Unable to assess due to mental status  Past Medical History  He,  has a past medical history of Hypertension and Parkinson's disease.   Surgical History    Past Surgical History:  Procedure Laterality Date  . INGUINAL HERNIA  REPAIR Left 07/26/2016   Procedure: HERNIA REPAIR INGUINAL ADULT;  Surgeon: Johnathan Hausen, MD;  Location: [facility];  Service: General;  Laterality: Left;  . PROSTATE SURGERY       Social History   reports that he has quit smoking. He has never used smokeless tobacco. He reports that he does not drink alcohol or use drugs.   Family History   His family history is not on file.   Allergies No Known Allergies   Home Medications  Prior to Admission medications   Medication Sig Start Date End Date Taking? Authorizing Provider  alum & mag hydroxide-simeth (MAALOX/MYLANTA) 200-200-20 MG/5ML suspension Take 30 mLs by mouth every 4 (four) hours as needed (upset stomach (do not more than 4 doses in 24 hours)).   Yes [provider]  aspirin EC 81 MG EC tablet Take 1 tablet (81 mg total) by mouth daily. 10/02/17  Yes Patrecia Pour, Christean Grief, MD  Calcium Carbonate-Vitamin D3 (CALCIUM 600/VITAMIN D) 600-400 MG-UNIT TABS Take 2 tablets by mouth daily.   Yes [provider]  carbidopa-levodopa (SINEMET IR) 25-100 MG per tablet Take 2 tablets by mouth 3 (three) times daily. 8am, 12pm, 8pm   Yes [provider]  Cholecalciferol (VITAMIN D) 50 MCG (2000 UT) tablet Take 4,000 Units by mouth daily.   Yes [provider]  cloNIDine (CATAPRES - DOSED IN MG/24 HR) 0.1 mg/24hr patch Place 1 patch (0.1 mg total) onto the skin once a week. Patient taking differently: Place 0.1 mg onto the skin every Monday.  08/03/16  Yes Barton Dubois, MD  folic acid (FOLVITE) 272 MCG tablet Take 800 mcg by mouth daily.    Yes [provider]  furosemide (LASIX) 20 MG tablet Take 60 mg by mouth daily.   Yes [provider]  hypromellose (GENTEAL) 0.3 % GEL ophthalmic ointment Place 1 application into both eyes every 8 (eight) hours as needed for dry eyes.   Yes [provider]  leuprolide (LUPRON) 22.5 MG injection Inject 22.5 mg into the muscle every 3 (three) months.   Yes  [provider]  metoprolol (LOPRESSOR) 50 MG tablet Take 50 mg by mouth daily.    Yes [provider]  mirtazapine (REMERON) 7.5 MG tablet Take 7.5 mg by mouth at bedtime.   Yes [provider]  pantoprazole (PROTONIX) 40 MG tablet Take 40 mg by mouth daily.   Yes [provider]  furosemide (LASIX) 40 MG tablet Take 1.5 tablets (  60 mg total) by mouth daily. Patient not taking: Reported on 04/24/2019 10/01/17 11/10/17  Lenox Ponds, MD     Critical care time: 45 minutes

## 2019-05-02 DEATH — deceased

## 2020-02-12 IMAGING — CT CT HEAD W/O CM
3 series · 15 of 47 positions shown, 18 images · non-contrast
Comparison: None.

CLINICAL DATA: 89-year-old male with generalized weakness.
Hypotension.

EXAM:
CT HEAD WITHOUT CONTRAST
TECHNIQUE: Contiguous axial images were obtained from the base of the skull
through the vertex without intravenous contrast.

[Series 3: head 5.0 h30s · axial · 0.46mm/px · z∈[-160,-25]mm · 9 of 33 slices shown, 12 images]
[im 3/33  brain]
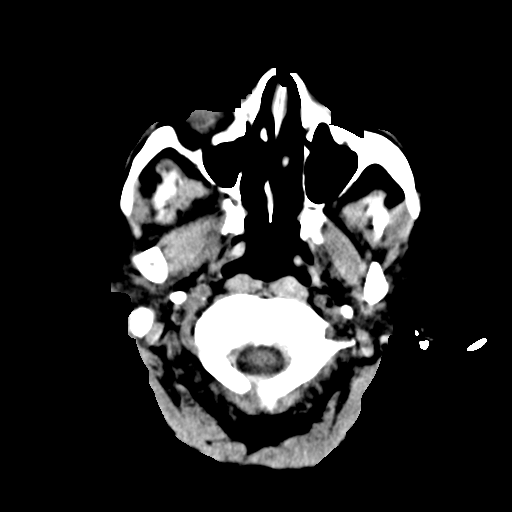
[im 3/33  bone]
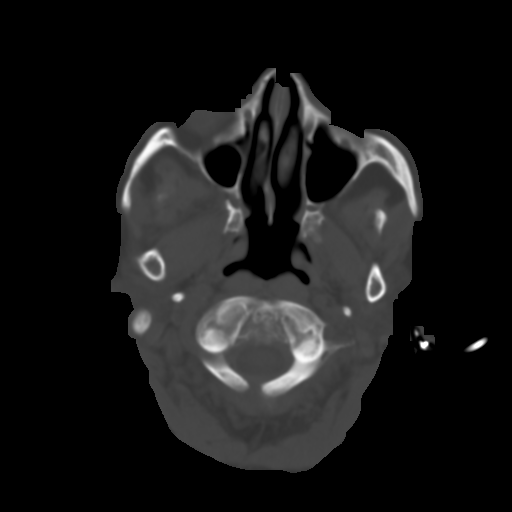
[im 6/33  brain]
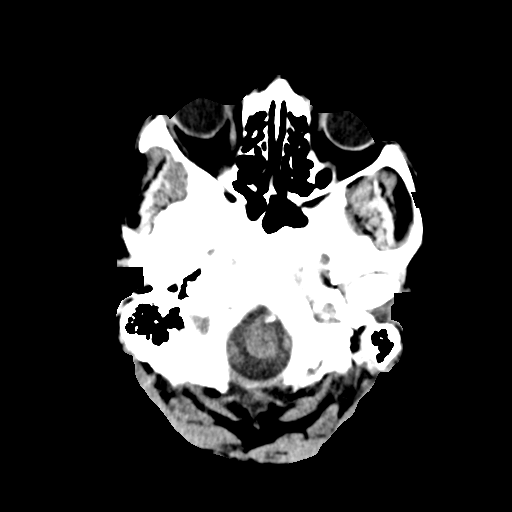
[im 9/33  brain]
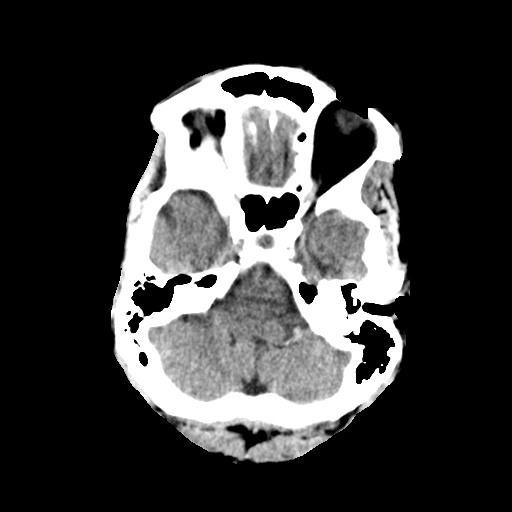
[im 13/33  brain]
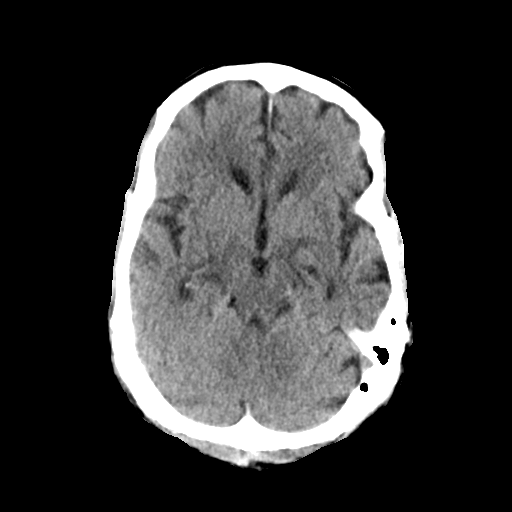
[im 17/33  brain]
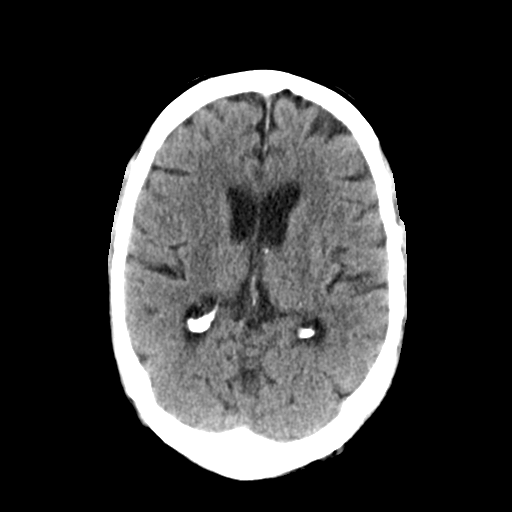
[im 17/33  bone]
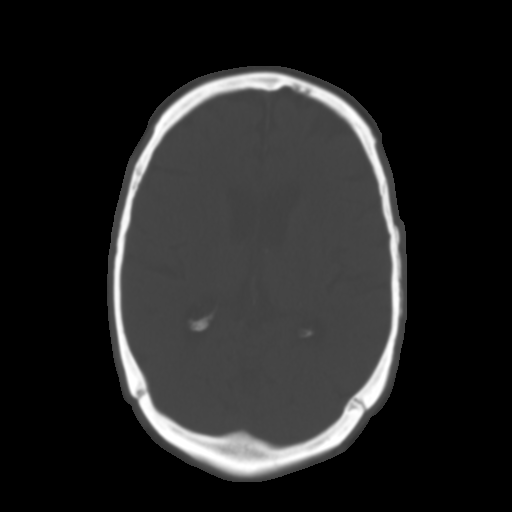
[im 20/33  brain]
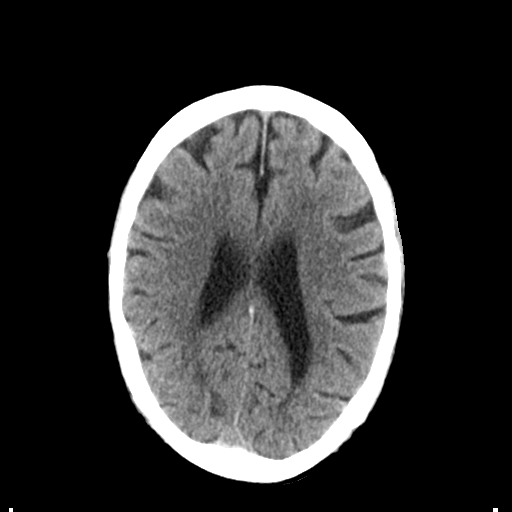
[im 24/33  brain]
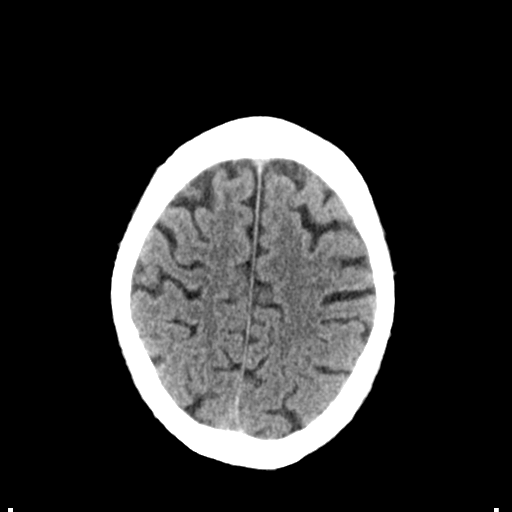
[im 27/33  brain]
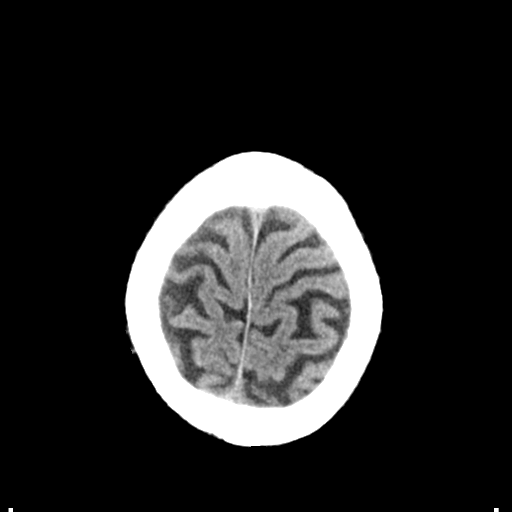
[im 30/33  brain]
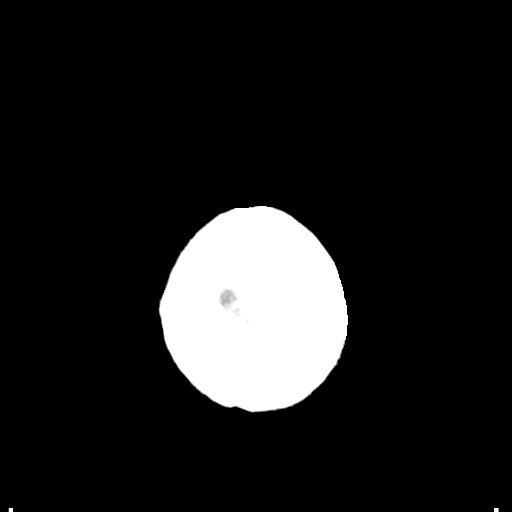
[im 30/33  bone]
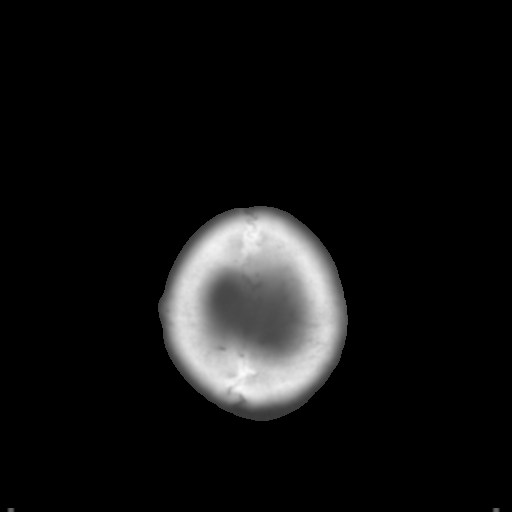

[Series 5: head 3.0 mpr cor · coronal · 0.34mm/px · 3 of 70 slices shown]
[im 24/70  brain]
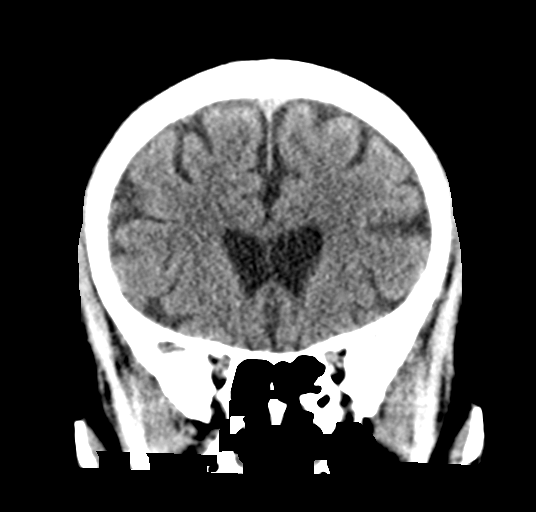
[im 31/70  brain]
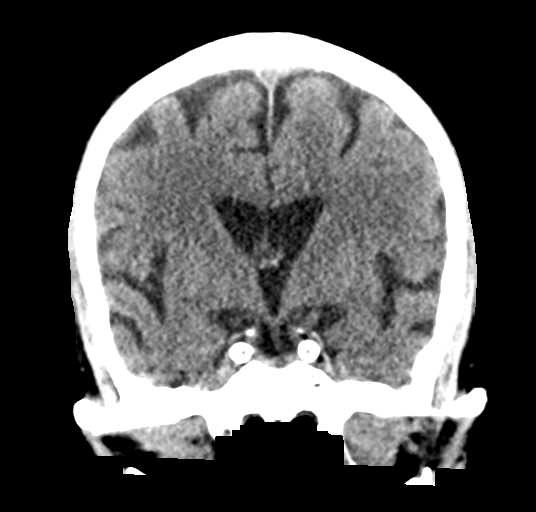
[im 39/70  brain]
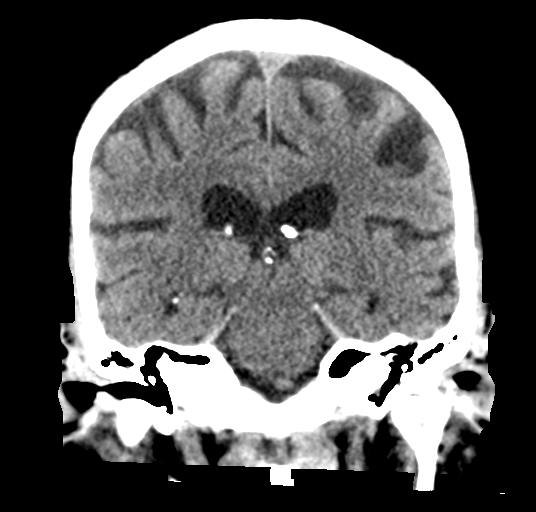

[Series 6: head 3.0 mpr sag · sagittal · 0.33mm/px · 3 of 58 slices shown]
[im 20/58  brain]
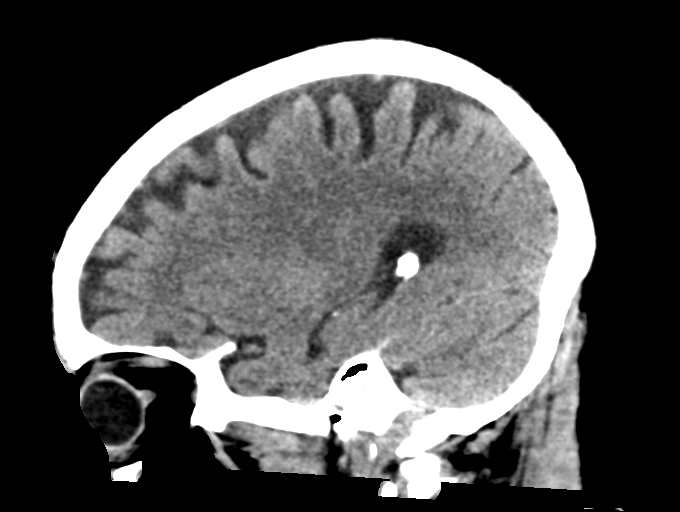
[im 29/58  brain]
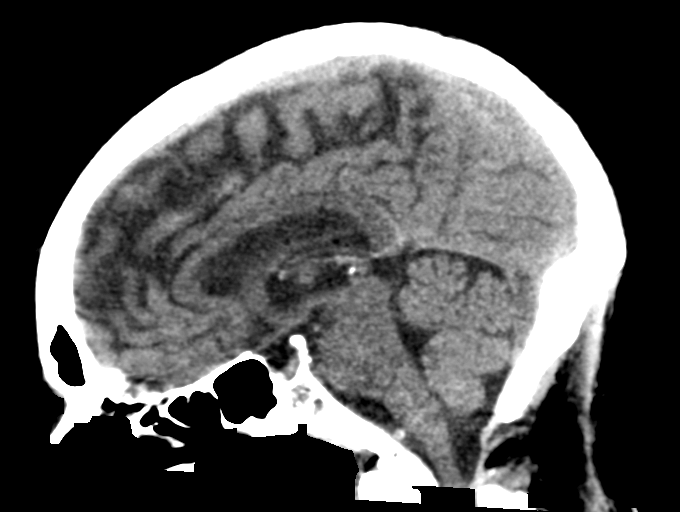
[im 39/58  brain]
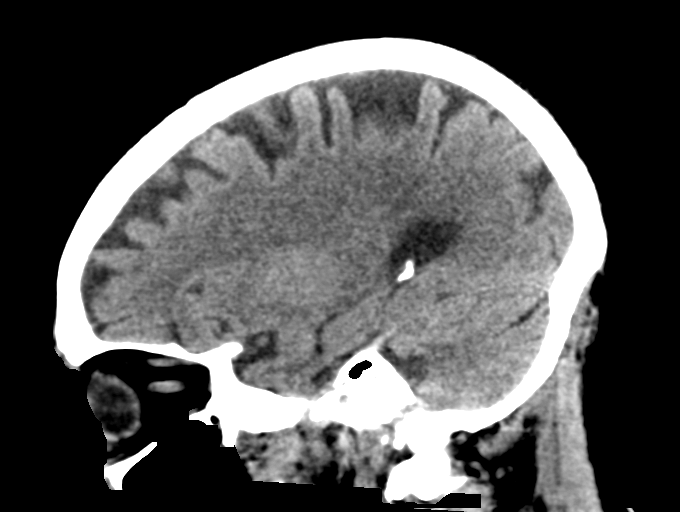

[15 of 47 positions shown; findings below may reference images not displayed]

FINDINGS: Brain: Very mild cerebral atrophy. Patchy areas of mild decreased
attenuation are noted throughout the deep and periventricular white
matter of the cerebral hemispheres bilaterally, compatible with mild
chronic microvascular ischemic disease. No evidence of acute
infarction, hemorrhage, hydrocephalus, extra-axial collection or
mass lesion/mass effect.

Vascular: No hyperdense vessel or unexpected calcification.

Skull: Normal. Negative for fracture or focal lesion.

Sinuses/Orbits: No acute finding.

Other: None.
IMPRESSION: 1. No acute intracranial abnormalities.
2. Mild cerebral atrophy with mild chronic microvascular ischemic
changes in the cerebral white matter.
